# Patient Record
Sex: Female | Born: 1985 | Race: White | Hispanic: No | Marital: Married | Smoking: Never smoker
Health system: Southern US, Community
[De-identification: ages and names within clinical notes are randomized; demographics above are authoritative.]

## PROBLEM LIST (undated history)

## (undated) DIAGNOSIS — E669 Obesity, unspecified: Secondary | ICD-10-CM

## (undated) DIAGNOSIS — Z5189 Encounter for other specified aftercare: Secondary | ICD-10-CM

## (undated) DIAGNOSIS — R51 Headache: Secondary | ICD-10-CM

## (undated) DIAGNOSIS — I10 Essential (primary) hypertension: Secondary | ICD-10-CM

## (undated) DIAGNOSIS — D649 Anemia, unspecified: Secondary | ICD-10-CM

## (undated) DIAGNOSIS — E538 Deficiency of other specified B group vitamins: Secondary | ICD-10-CM

## (undated) DIAGNOSIS — Z8489 Family history of other specified conditions: Secondary | ICD-10-CM

## (undated) DIAGNOSIS — Z9889 Other specified postprocedural states: Secondary | ICD-10-CM

## (undated) DIAGNOSIS — E785 Hyperlipidemia, unspecified: Secondary | ICD-10-CM

## (undated) DIAGNOSIS — D689 Coagulation defect, unspecified: Secondary | ICD-10-CM

## (undated) DIAGNOSIS — R195 Other fecal abnormalities: Secondary | ICD-10-CM

## (undated) DIAGNOSIS — F32A Depression, unspecified: Secondary | ICD-10-CM

## (undated) DIAGNOSIS — R112 Nausea with vomiting, unspecified: Secondary | ICD-10-CM

## (undated) DIAGNOSIS — R55 Syncope and collapse: Secondary | ICD-10-CM

## (undated) DIAGNOSIS — R519 Headache, unspecified: Secondary | ICD-10-CM

## (undated) DIAGNOSIS — Z9884 Bariatric surgery status: Secondary | ICD-10-CM

## (undated) DIAGNOSIS — K219 Gastro-esophageal reflux disease without esophagitis: Secondary | ICD-10-CM

## (undated) DIAGNOSIS — G43909 Migraine, unspecified, not intractable, without status migrainosus: Secondary | ICD-10-CM

## (undated) DIAGNOSIS — Z8041 Family history of malignant neoplasm of ovary: Secondary | ICD-10-CM

## (undated) DIAGNOSIS — IMO0002 Reserved for concepts with insufficient information to code with codable children: Secondary | ICD-10-CM

## (undated) DIAGNOSIS — E559 Vitamin D deficiency, unspecified: Secondary | ICD-10-CM

## (undated) DIAGNOSIS — A6000 Herpesviral infection of urogenital system, unspecified: Secondary | ICD-10-CM

## (undated) HISTORY — PX: MOUTH SURGERY: SHX715

## (undated) HISTORY — DX: Depression, unspecified: F32.A

## (undated) HISTORY — DX: Coagulation defect, unspecified: D68.9

## (undated) HISTORY — PX: TUBAL LIGATION: SHX77

## (undated) HISTORY — DX: Other fecal abnormalities: R19.5

## (undated) HISTORY — PX: WISDOM TOOTH EXTRACTION: SHX21

## (undated) HISTORY — PX: DIAGNOSTIC LAPAROSCOPY: SUR761

## (undated) HISTORY — DX: Family history of malignant neoplasm of ovary: Z80.41

## (undated) HISTORY — DX: Bariatric surgery status: Z98.84

## (undated) HISTORY — DX: Essential (primary) hypertension: I10

## (undated) HISTORY — PX: GASTRIC BYPASS: SHX52

## (undated) HISTORY — DX: Reserved for concepts with insufficient information to code with codable children: IMO0002

## (undated) HISTORY — PX: EYE SURGERY: SHX253

## (undated) HISTORY — DX: Obesity, unspecified: E66.9

## (undated) HISTORY — PX: ENDOMETRIAL BIOPSY: SHX622

## (undated) HISTORY — PX: LASIK: SHX215

## (undated) HISTORY — DX: Anemia, unspecified: D64.9

## (undated) HISTORY — DX: Migraine, unspecified, not intractable, without status migrainosus: G43.909

## (undated) HISTORY — DX: Deficiency of other specified B group vitamins: E53.8

## (undated) HISTORY — DX: Vitamin D deficiency, unspecified: E55.9

## (undated) HISTORY — PX: SMALL INTESTINE SURGERY: SHX150

## (undated) HISTORY — PX: BUNIONECTOMY: SHX129

## (undated) HISTORY — DX: Hyperlipidemia, unspecified: E78.5

## (undated) HISTORY — DX: Encounter for other specified aftercare: Z51.89

## (undated) HISTORY — PX: APPENDECTOMY: SHX54

---
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
## 2005-01-20 ENCOUNTER — Observation Stay: Payer: Self-pay | Admitting: Unknown Physician Specialty

## 2005-04-07 ENCOUNTER — Observation Stay: Payer: Self-pay

## 2005-07-31 ENCOUNTER — Inpatient Hospital Stay: Payer: Self-pay

## 2006-04-15 ENCOUNTER — Emergency Department: Payer: Self-pay | Admitting: Emergency Medicine

## 2006-06-11 ENCOUNTER — Other Ambulatory Visit: Payer: Self-pay

## 2006-06-11 ENCOUNTER — Emergency Department: Payer: Self-pay | Admitting: Emergency Medicine

## 2006-06-11 IMAGING — CT HEAD^HEAD (ADULT)
2 series · 16 of 30 positions shown, 20 images · non-contrast
Comparison: none

REASON FOR EXAM: Altered mental status      rm 15
COMMENTS:

[Series 2: without · axial · non-contrast · 0.40mm/px · z∈[+280,+400]mm · 13 of 30 slices shown, 17 images]
[im 3/30  brain]
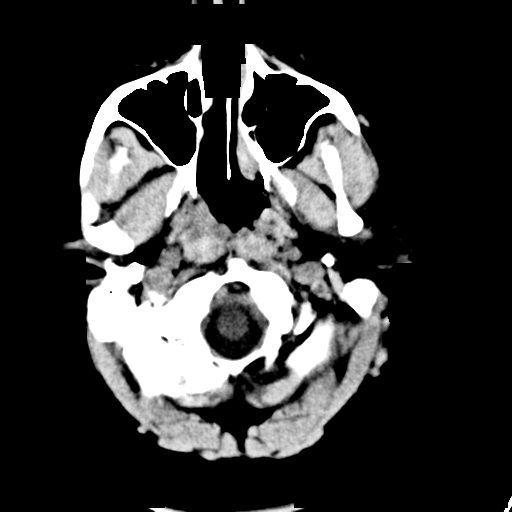
[im 3/30  bone]
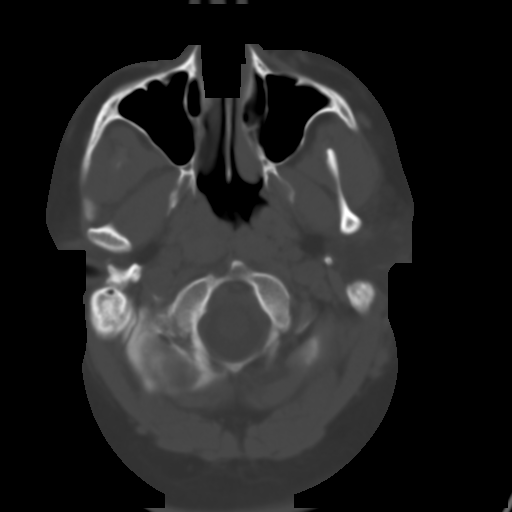
[im 5/30  brain]
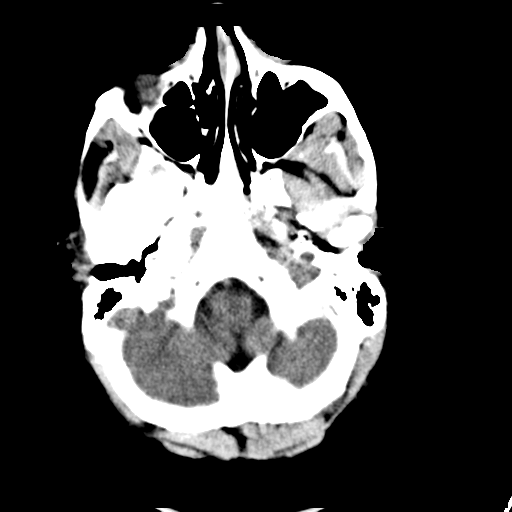
[im 7/30  brain]
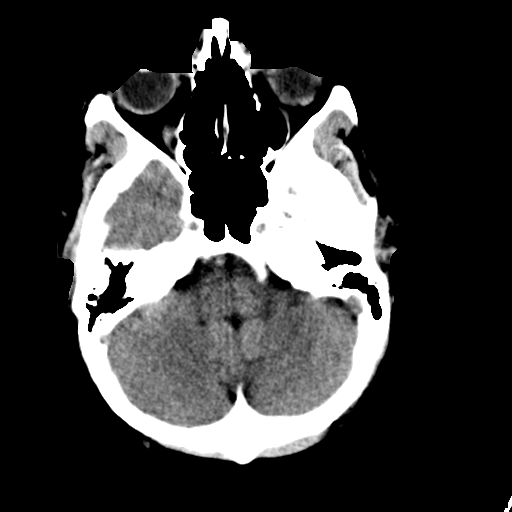
[im 9/30  brain]
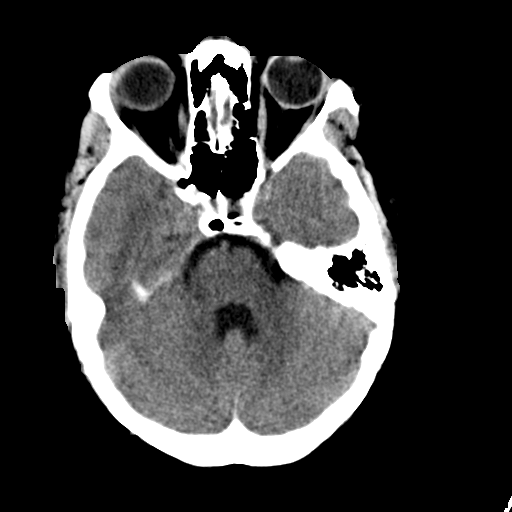
[im 11/30  brain]
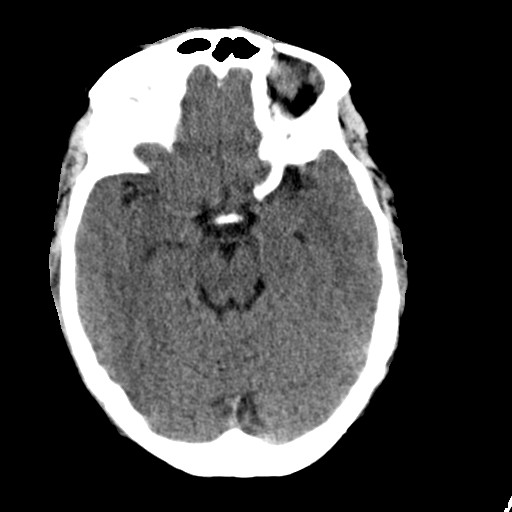
[im 11/30  bone]
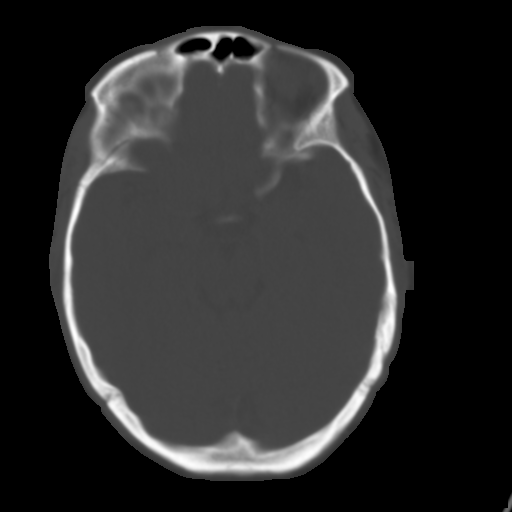
[im 13/30  brain]
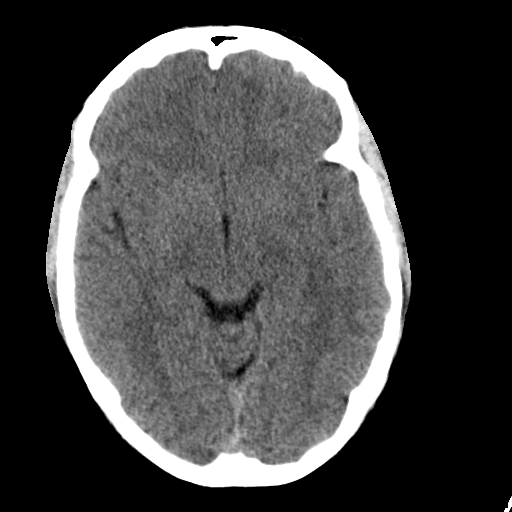
[im 15/30  brain]
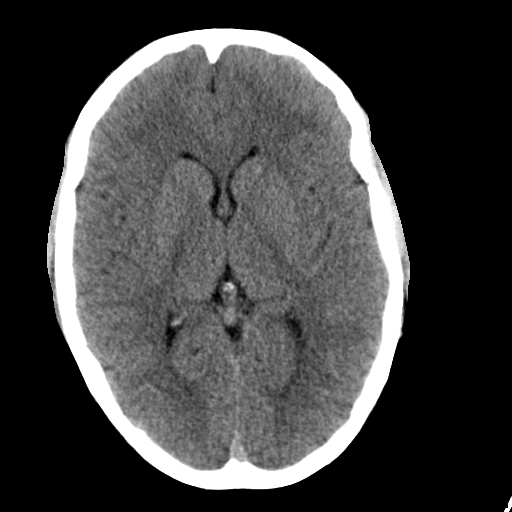
[im 17/30  brain]
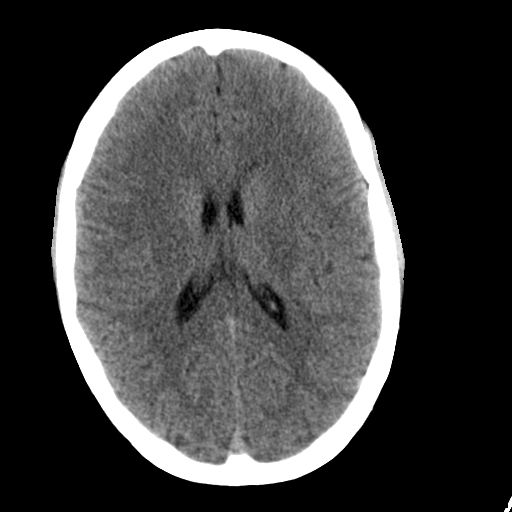
[im 19/30  brain]
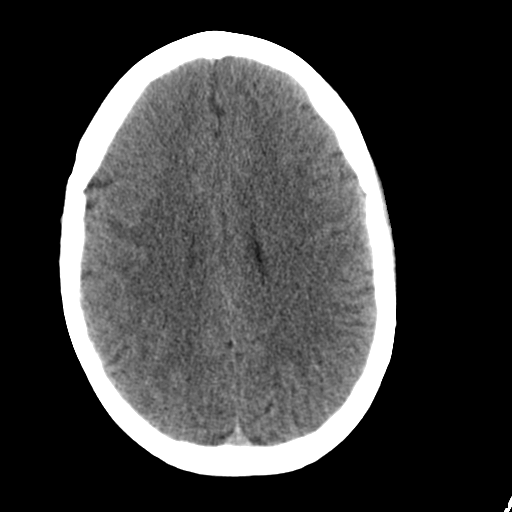
[im 19/30  bone]
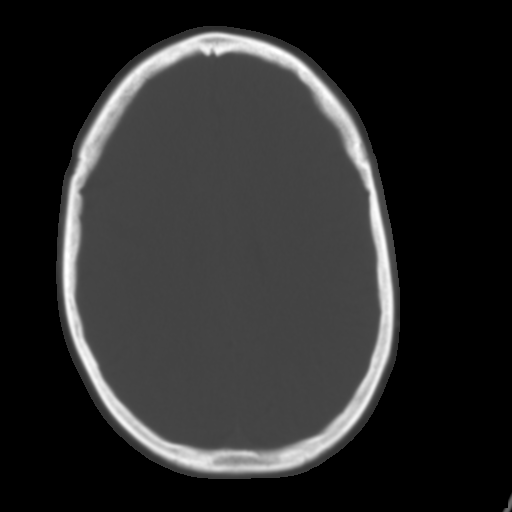
[im 21/30  brain]
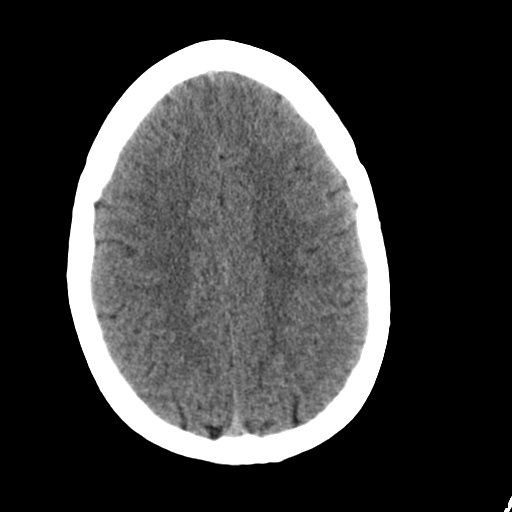
[im 23/30  brain]
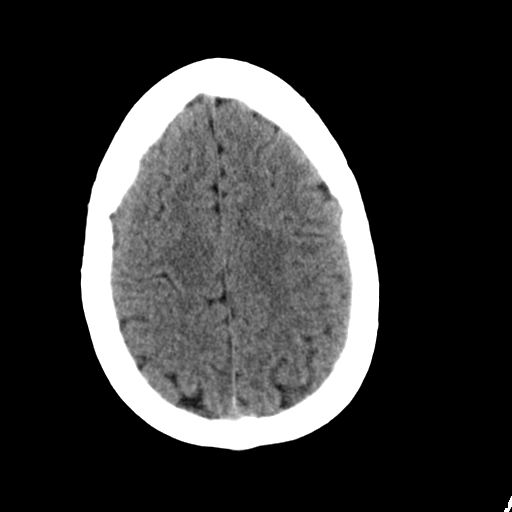
[im 25/30  brain]
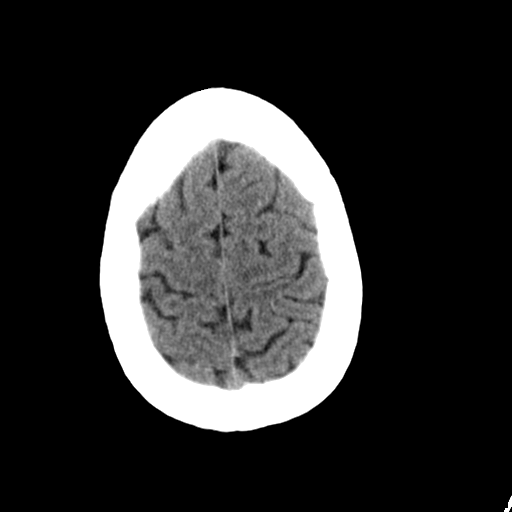
[im 27/30  brain]
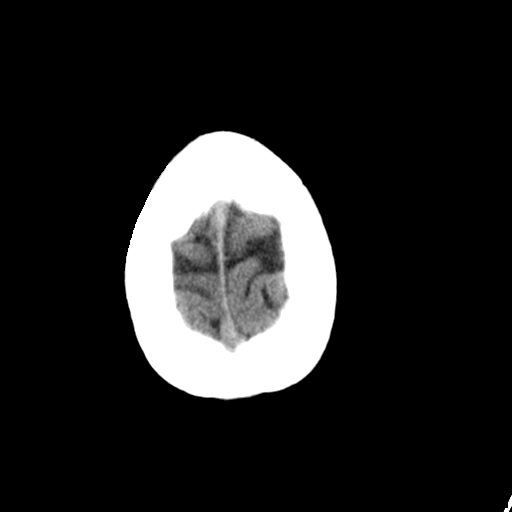
[im 27/30  bone]
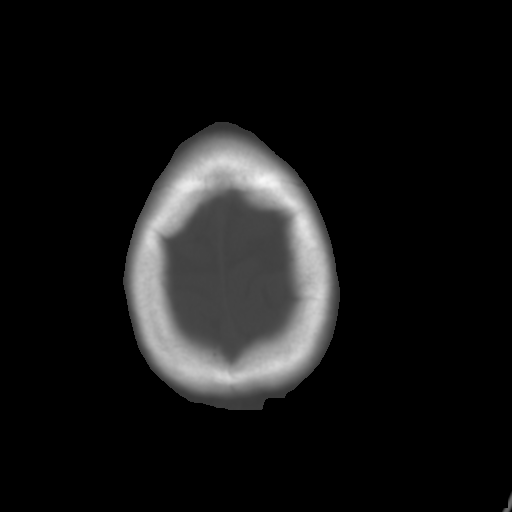

[Series 3: bone · axial · 0.40mm/px · z∈[+280,+320]mm · 3 of 30 slices shown]
[im 3/30  bone]
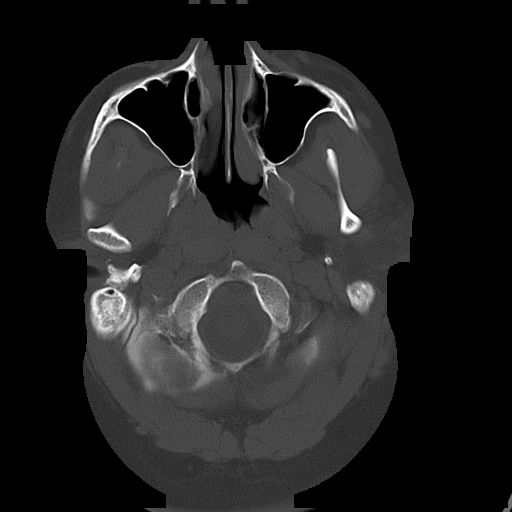
[im 7/30  bone]
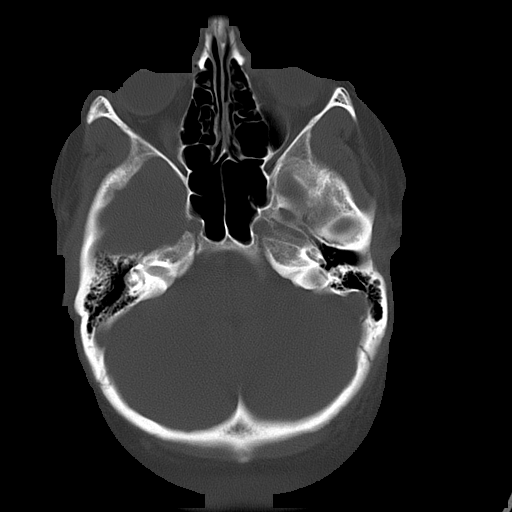
[im 11/30  bone]
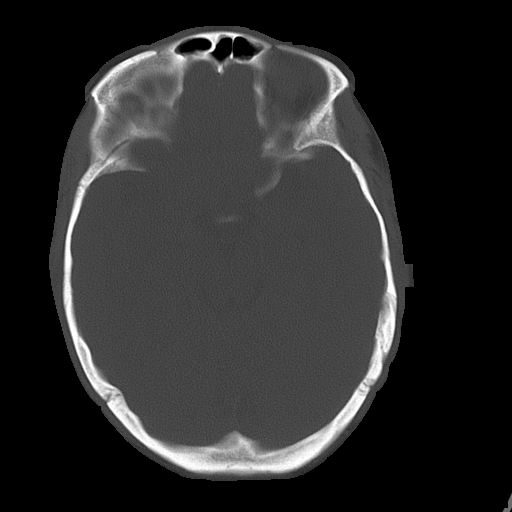

[16 of 30 positions shown; findings below may reference images not displayed]

PROCEDURE:     CT  - CT HEAD WITHOUT CONTRAST  - June 11, 2006  [DATE]

RESULT:     Unenhanced emergent head CT was performed for headaches.
Unenhanced emergent head CT reveals no intracerebral bleeds, no infarcts, no
mass effect, and no shift of the midline.  The ventricles appear within
normal limits.  No extraaxial fluid collections are seen.  On the bone
window settings no significant bony abnormalities are identified.  The
sinuses and mastoids appear clear.
IMPRESSION: No acute findings identified on the unenhanced head CT.

The report was called to the emergency room at the conclusion of the
dictation.

## 2006-06-11 IMAGING — CR DXR CHEST PA (OR AP) AND LATERAL
1 series · 2 of 2 positions shown · non-contrast
Comparison: none

REASON FOR EXAM: Altered mental status       rm 15
COMMENTS:

PROCEDURE:     DXR - DXR CHEST PA (OR AP) AND LATERAL  - June 11, 2006  [DATE]
RESULT:       The lung fields are clear.  The heart, mediastinal and osseous
structures show no significant abnormalities.

[Series 1: view not recorded · 0.17mm/px · 2 of 2 slices shown]
[im 1/2]
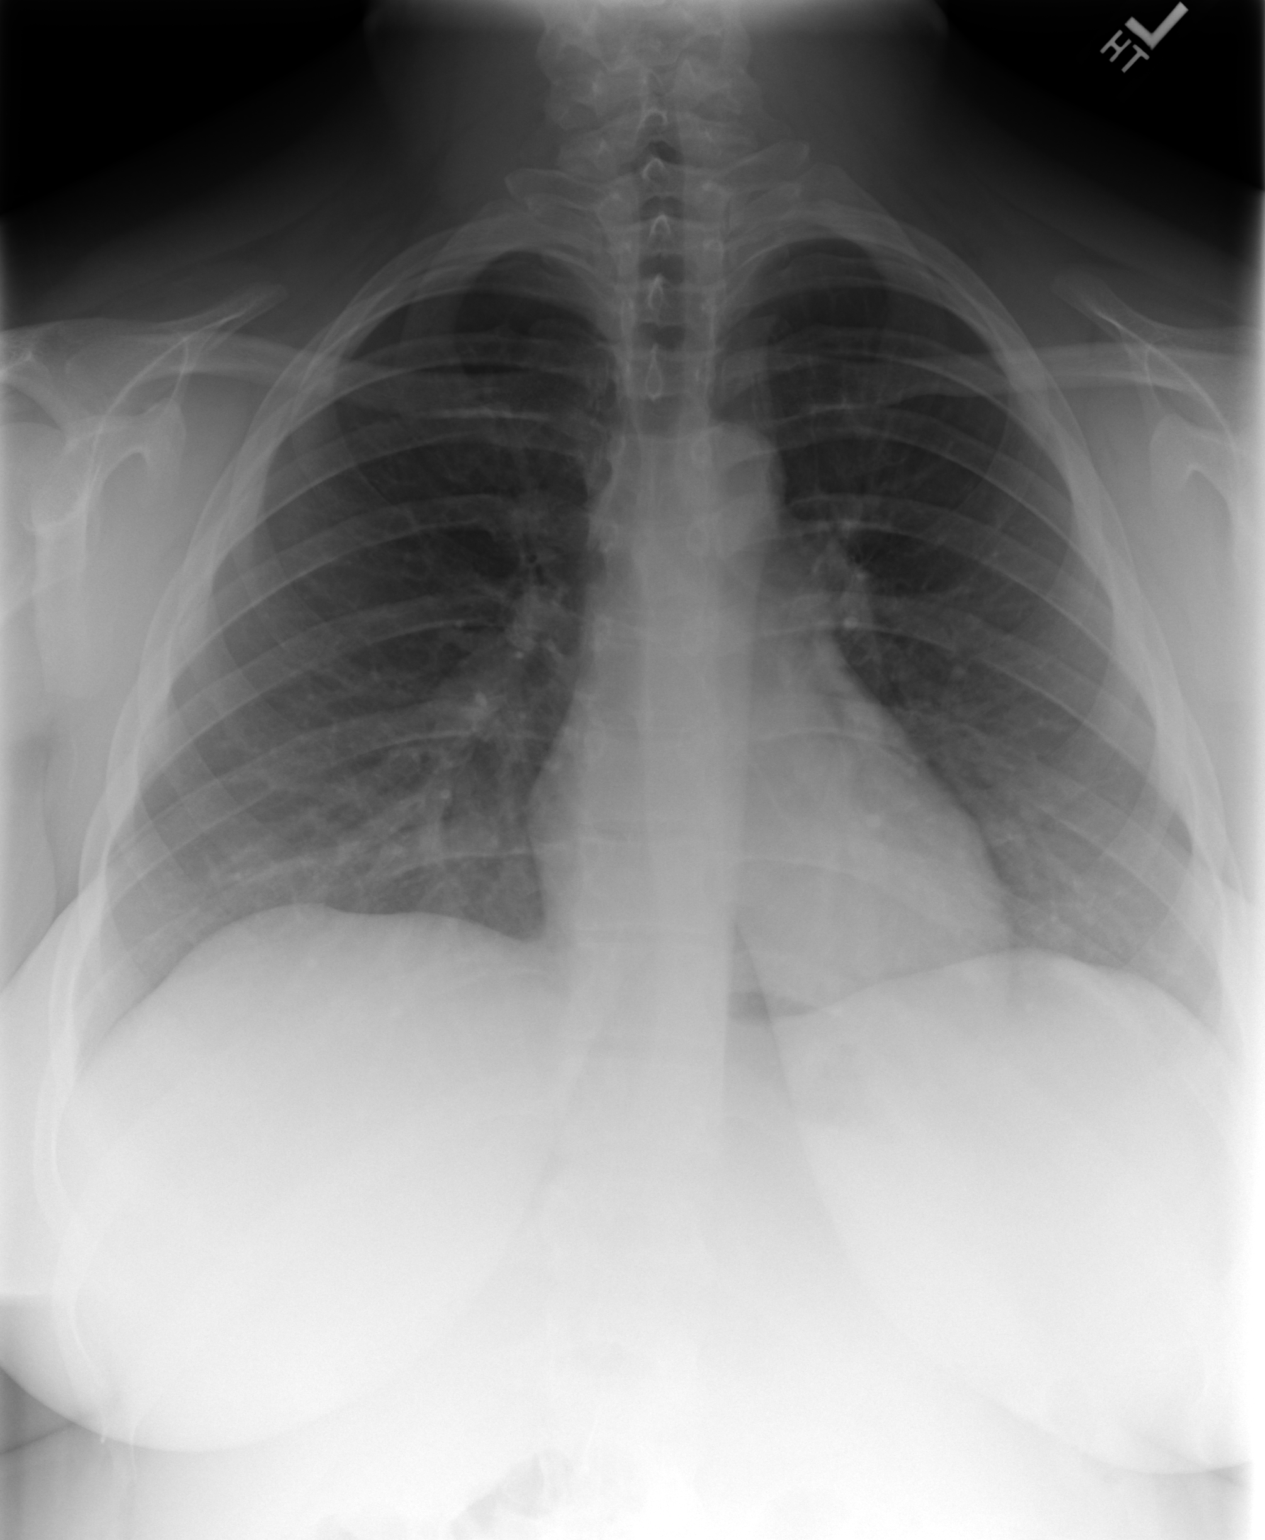
[im 2/2]
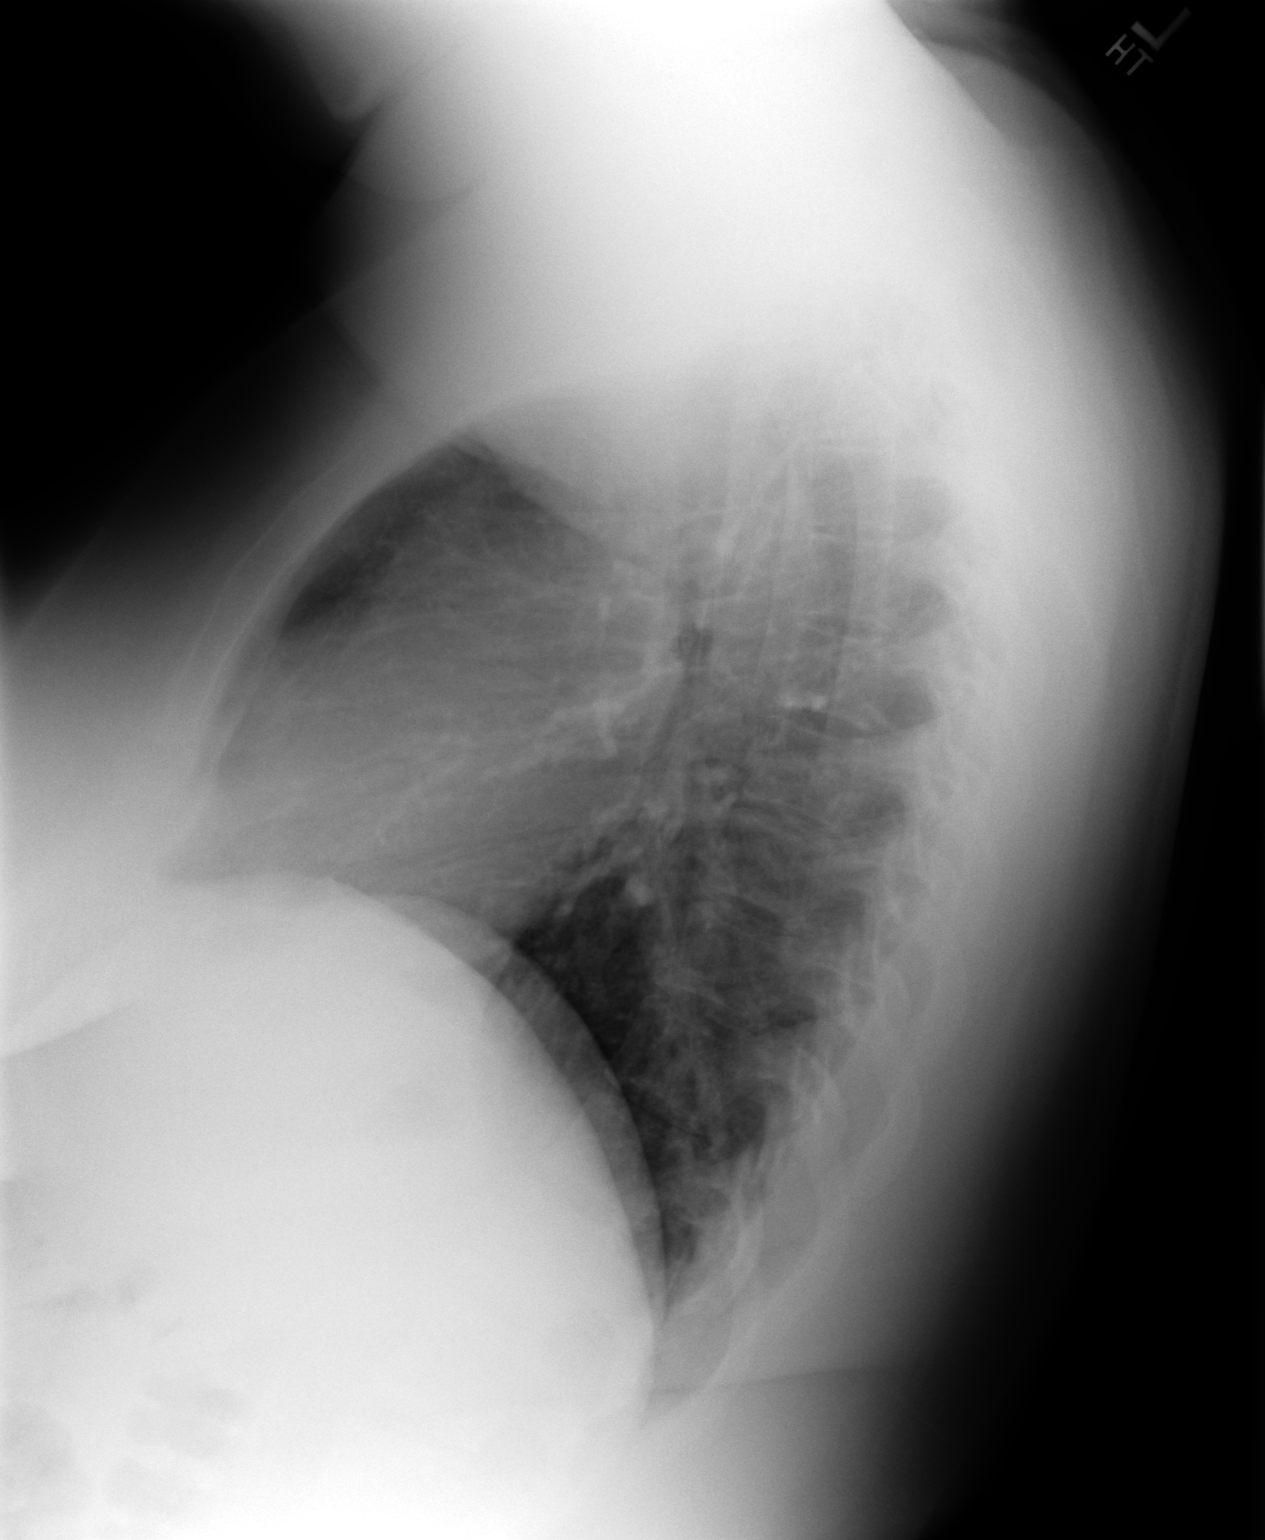

[2 of 2 positions shown; findings below may reference images not displayed]

IMPRESSION: No acute changes are identified.

## 2006-12-07 ENCOUNTER — Emergency Department: Payer: Self-pay | Admitting: Emergency Medicine

## 2007-01-09 ENCOUNTER — Emergency Department: Payer: Self-pay | Admitting: Emergency Medicine

## 2008-03-26 ENCOUNTER — Other Ambulatory Visit: Payer: Self-pay

## 2008-03-26 ENCOUNTER — Emergency Department: Payer: Self-pay | Admitting: Emergency Medicine

## 2008-03-26 IMAGING — CR DXR CHEST PA (OR AP) AND LATERAL
1 series · 2 of 2 positions shown · non-contrast
Comparison: none

REASON FOR EXAM: CHEST PAIN
COMMENTS:

[Series 1: view not recorded · 0.17mm/px · 2 of 2 slices shown]
[im 1/2]
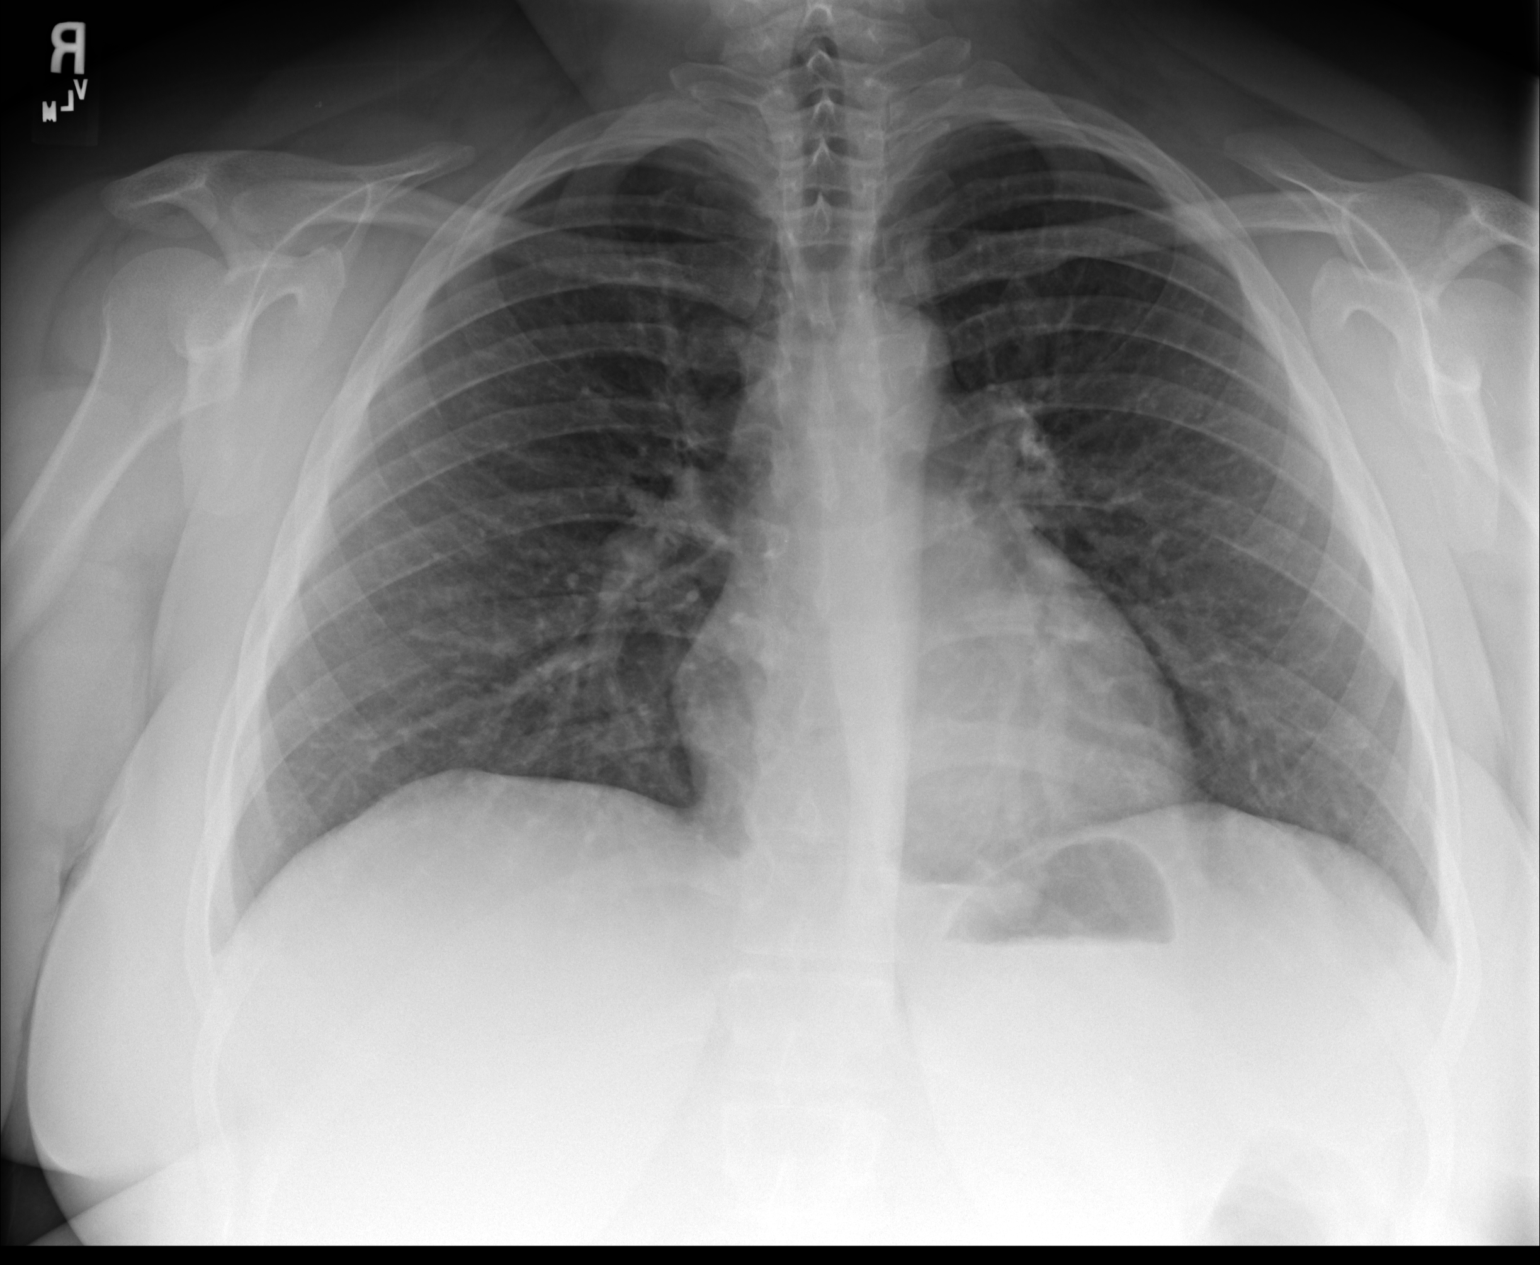
[im 2/2]
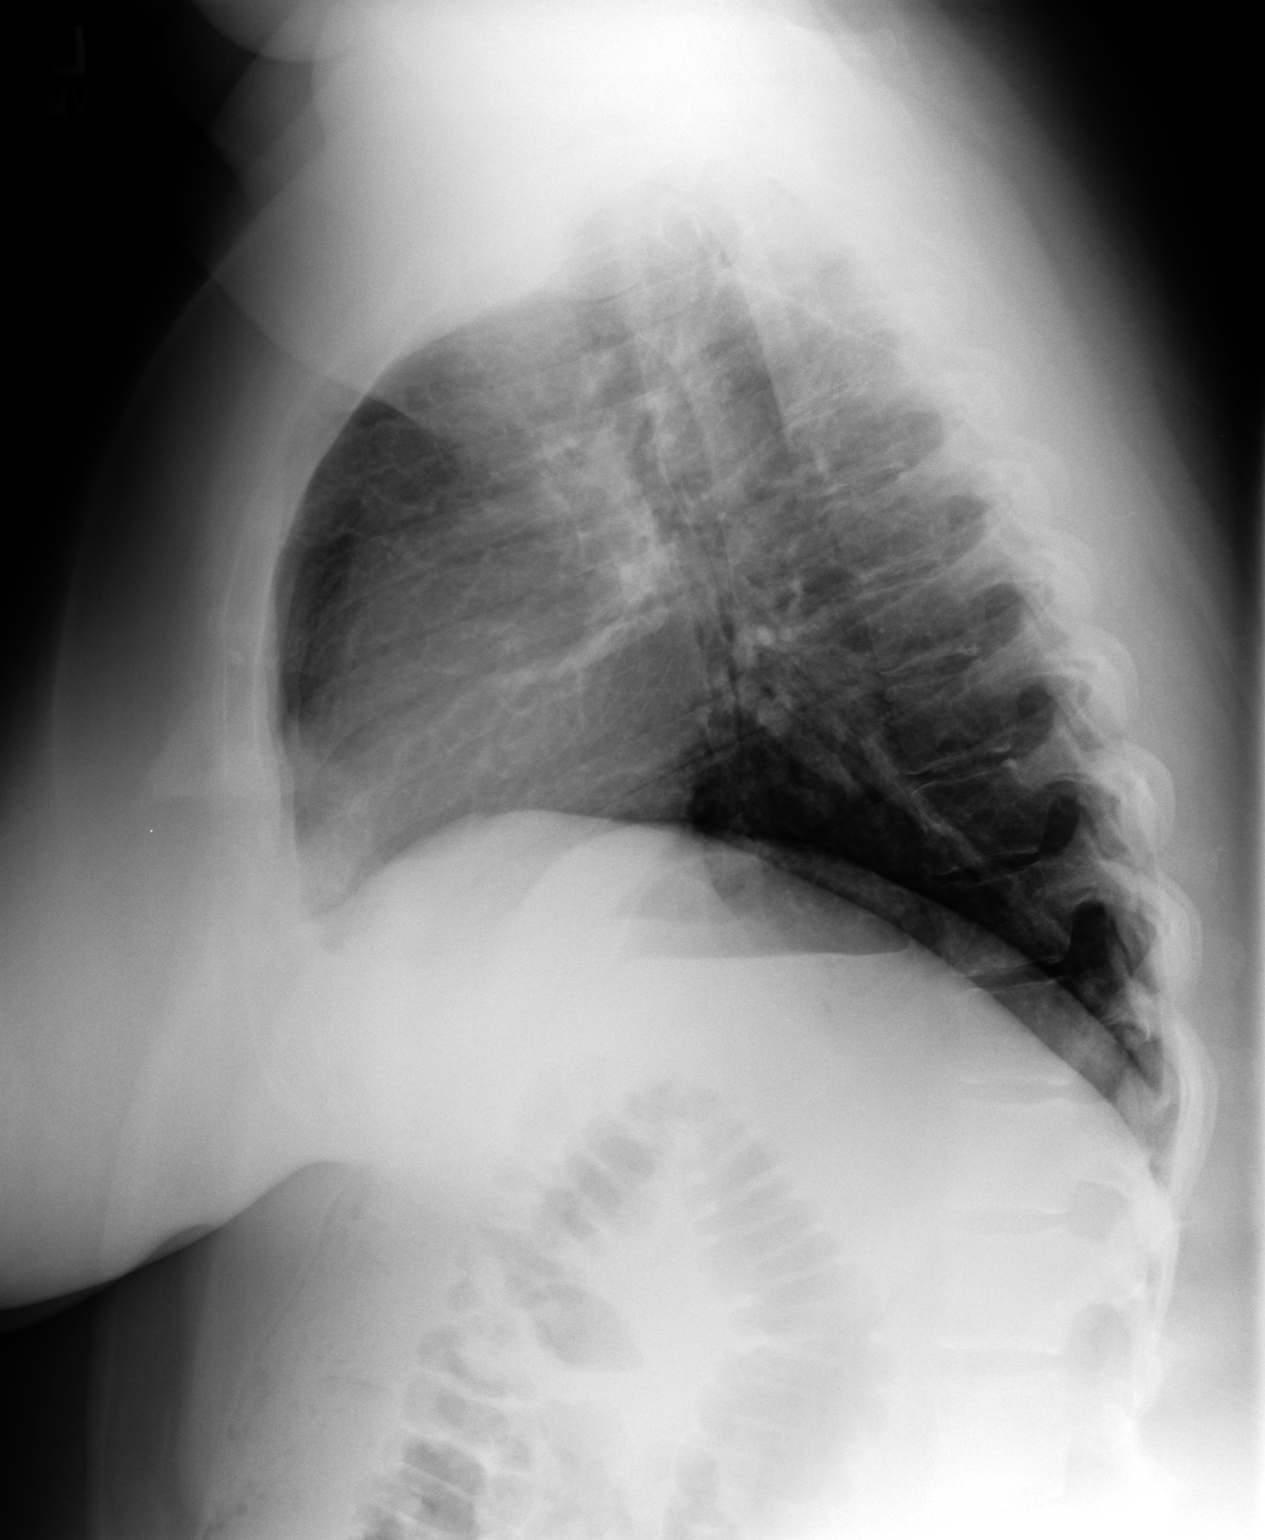

[2 of 2 positions shown; findings below may reference images not displayed]

PROCEDURE:     DXR - DXR CHEST PA (OR AP) AND LATERAL  - March 26, 2008 [DATE]

RESULT:     Comparison is made to the exam dated 06/11/2006.

The lungs are clear. The heart and pulmonary vessels are normal. The bony
and mediastinal structures are unremarkable. There is no effusion. There is
no pneumothorax or evidence of congestive failure.
IMPRESSION: No acute cardiopulmonary disease.

## 2008-04-12 ENCOUNTER — Emergency Department: Payer: Self-pay | Admitting: Emergency Medicine

## 2008-04-19 ENCOUNTER — Ambulatory Visit: Payer: Self-pay | Admitting: Internal Medicine

## 2008-08-21 ENCOUNTER — Emergency Department: Payer: Self-pay | Admitting: Emergency Medicine

## 2008-09-01 ENCOUNTER — Emergency Department: Payer: Self-pay | Admitting: Emergency Medicine

## 2009-07-26 ENCOUNTER — Ambulatory Visit: Payer: Self-pay | Admitting: Family Medicine

## 2009-07-26 IMAGING — CR LEG BONE
1 series · 4 of 4 positions shown · non-contrast
Comparison: none

REASON FOR EXAM: RT KNEE PAIN
COMMENTS:

PROCEDURE:     KDR - KDXR KNEE RT COMP WITH OBLIQUES  - July 26, 2009 [DATE]
RESULT:     Images of the right knee demonstrate no fracture, dislocation or
radiopaque foreign body.

[Series 2: view not recorded · 0.17mm/px · 4 of 4 slices shown]
[im 1/4]
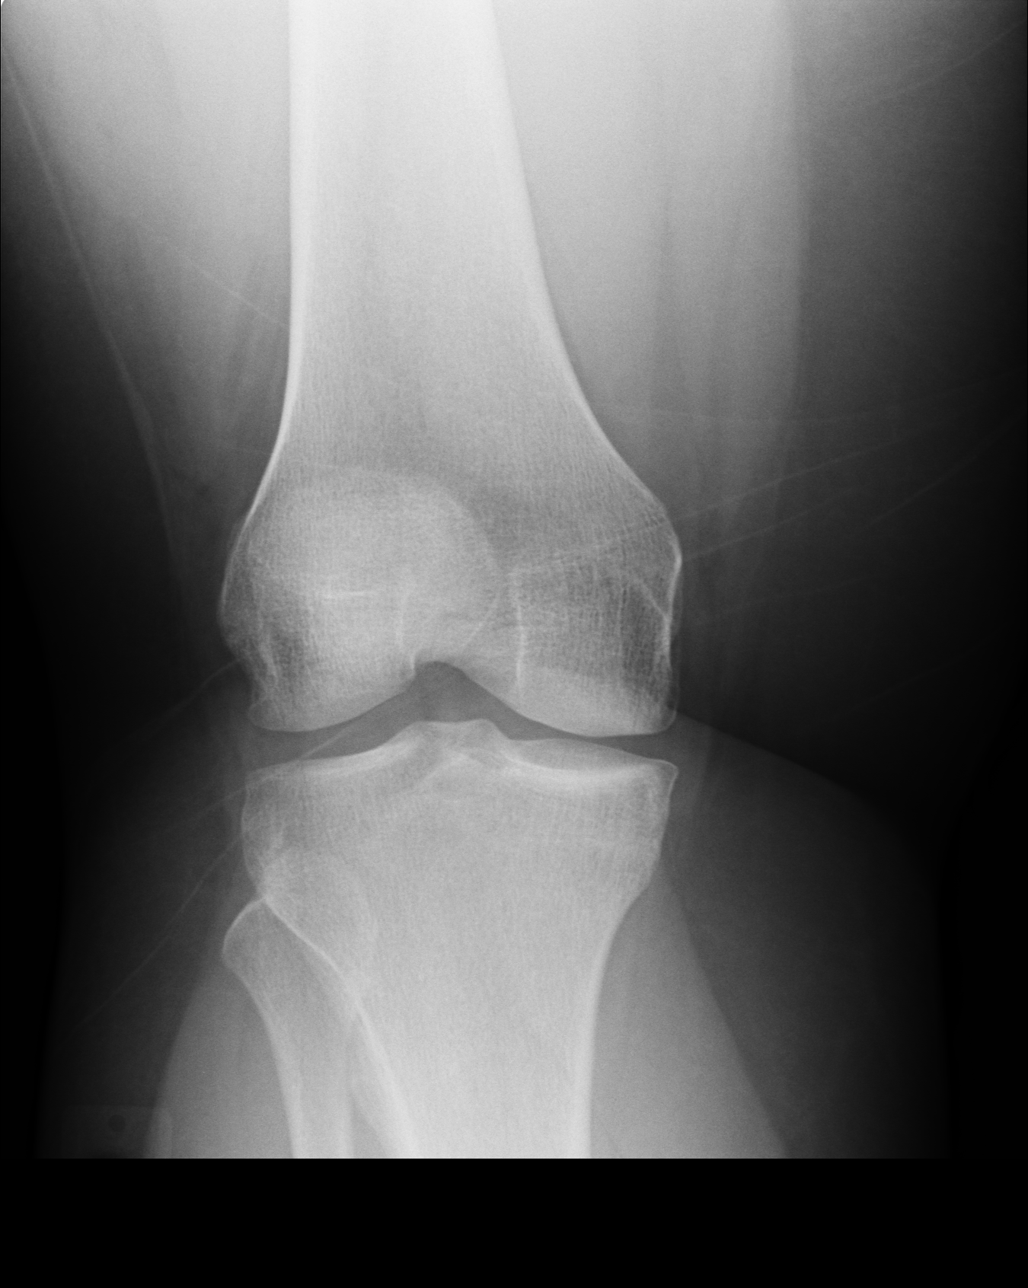
[im 2/4]
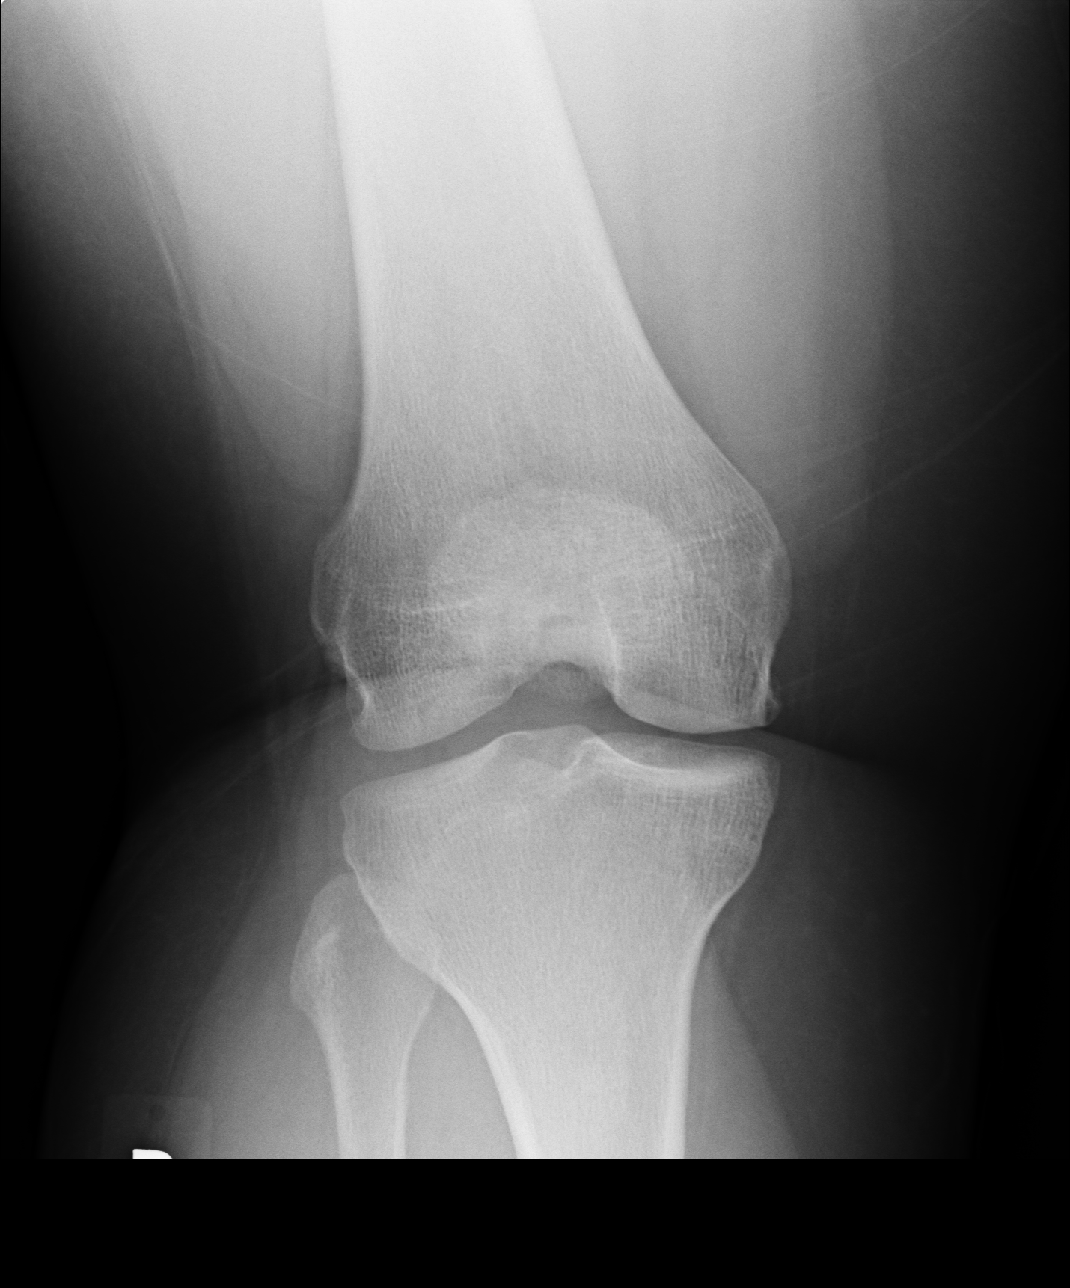
[im 3/4]
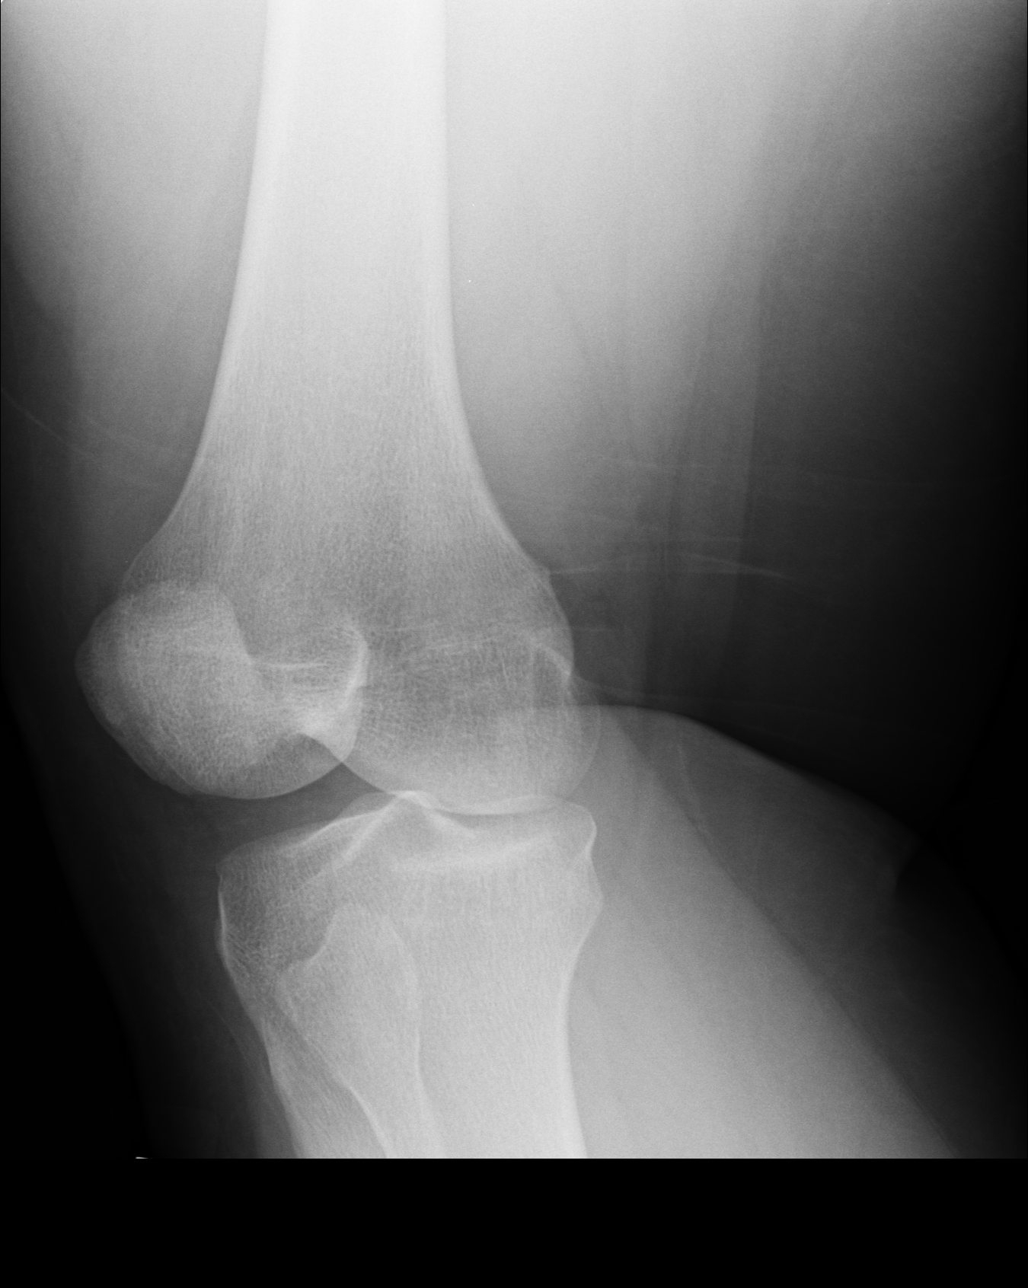
[im 4/4]
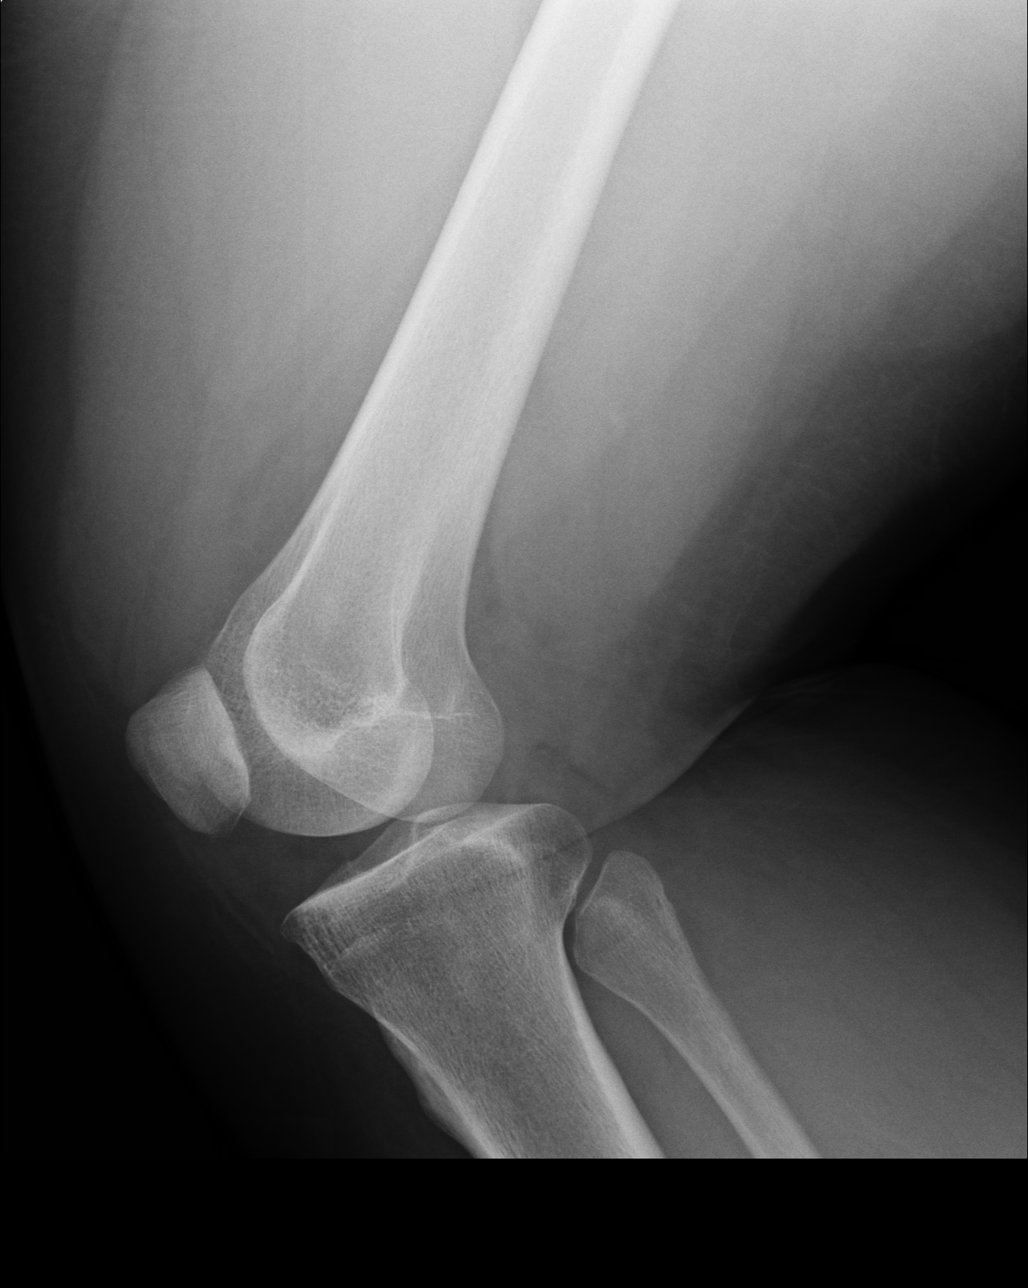

[4 of 4 positions shown; findings below may reference images not displayed]

IMPRESSION: Please see above.

## 2011-09-30 HISTORY — DX: Maternal care for unspecified type scar from previous cesarean delivery: O34.219

## 2011-12-04 DIAGNOSIS — G43009 Migraine without aura, not intractable, without status migrainosus: Secondary | ICD-10-CM | POA: Insufficient documentation

## 2011-12-04 DIAGNOSIS — G43709 Chronic migraine without aura, not intractable, without status migrainosus: Secondary | ICD-10-CM | POA: Insufficient documentation

## 2011-12-04 DIAGNOSIS — G43909 Migraine, unspecified, not intractable, without status migrainosus: Secondary | ICD-10-CM | POA: Insufficient documentation

## 2012-04-19 DIAGNOSIS — O34219 Maternal care for unspecified type scar from previous cesarean delivery: Secondary | ICD-10-CM | POA: Insufficient documentation

## 2012-06-04 DIAGNOSIS — O99810 Abnormal glucose complicating pregnancy: Secondary | ICD-10-CM | POA: Insufficient documentation

## 2012-06-04 HISTORY — DX: Abnormal glucose complicating pregnancy: O99.810

## 2012-10-22 DIAGNOSIS — D519 Vitamin B12 deficiency anemia, unspecified: Secondary | ICD-10-CM | POA: Insufficient documentation

## 2012-10-22 DIAGNOSIS — D649 Anemia, unspecified: Secondary | ICD-10-CM

## 2012-10-22 HISTORY — DX: Anemia, unspecified: D64.9

## 2013-05-25 DIAGNOSIS — Z9884 Bariatric surgery status: Secondary | ICD-10-CM | POA: Insufficient documentation

## 2014-07-16 ENCOUNTER — Ambulatory Visit: Payer: Self-pay | Admitting: Physician Assistant

## 2014-07-16 LAB — RAPID STREP-A WITH REFLX: Micro Text Report: NEGATIVE

## 2014-07-19 LAB — BETA STREP CULTURE(ARMC)

## 2014-08-05 ENCOUNTER — Ambulatory Visit: Payer: Self-pay | Admitting: Internal Medicine

## 2014-08-05 LAB — RAPID STREP-A WITH REFLX: Micro Text Report: NEGATIVE

## 2014-08-08 LAB — BETA STREP CULTURE(ARMC)

## 2015-04-30 DIAGNOSIS — R55 Syncope and collapse: Secondary | ICD-10-CM

## 2015-04-30 HISTORY — DX: Syncope and collapse: R55

## 2015-06-26 ENCOUNTER — Inpatient Hospital Stay: Admission: RE | Admit: 2015-06-26 | Payer: Self-pay | Source: Ambulatory Visit

## 2015-06-26 ENCOUNTER — Encounter: Payer: Self-pay | Admitting: *Deleted

## 2015-06-26 NOTE — Patient Instructions (Signed)
  Your procedure is scheduled on: 06-29-15 Report to [place] To find out your arrival time please call [phone] between 1PM - 3PM on 06-28-15  Remember: Instructions that are not followed completely may result in serious medical risk, up to and including death, or upon the discretion of your surgeon and anesthesiologist your surgery may need to be rescheduled.    _X___ 1. Do not eat food or drink liquids after midnight. No gum chewing or hard candies.     _X___ 2. No Alcohol for 24 hours before or after surgery.   ____ 3. Bring all medications with you on the day of surgery if instructed.    ____ 4. Notify your doctor if there is any change in your medical condition     (cold, fever, infections).     Do not wear jewelry, make-up, hairpins, clips or nail polish.  Do not wear lotions, powders, or perfumes. You may wear deodorant.  Do not shave 48 hours prior to surgery. Men may shave face and neck.  Do not bring valuables to the hospital.    [facility] is not responsible for any belongings or valuables.               Contacts, dentures or bridgework may not be worn into surgery.  Leave your suitcase in the car. After surgery it may be brought to your room.  For patients admitted to the hospital, discharge time is determined by your  treatment team.   Patients discharged the day of surgery will not be allowed to drive home.   Please read over the following fact sheets that you were given:      ____ Take these medicines the morning of surgery with A SIP OF WATER:    1. NONE  2.   3.   4.  5.  6.  ____ Fleet Enema (as directed)   ____ Use CHG Soap as directed  ____ Use inhalers on the day of surgery  ____ Stop metformin 2 days prior to surgery    ____ Take 1/2 of usual insulin dose the night before surgery and none on the morning of surgery.   ____ Stop Coumadin/Plavix/aspirin-N/A  ____ Stop Anti-inflammatories-NO NSAIDS OR ASA  PRODUCTS-TYLENOL OK   ____ Stop supplements until after surgery.    ____ Bring C-Pap to the hospital.

## 2015-06-28 NOTE — Pre-Procedure Instructions (Signed)
SENT CHART OVER TO ANESTHESIA FOR REVIEW OF NOTES FROM [facility]- DR Marylen Ponto MEDICAL CLEARANCE-CALLED NANCY AT [place] HER OF THIS- PT DOES NOT HAVE A PCP EXCEPT THAT SHE DOES SEE THE PA ALICIA COPELAND IN DR STAEBLERS OFFICE AND SHE WAS LAST SEEN BY THE PA IN [facility] OF THIS YEAR.  NANCY GOT DR STAEBLER TO CALL ME AND I INFORMED HIM OF THE CLEARANCE DUE TO PT NOT FOLLOWING UP WITH [facility] MD INSTRUCTIONS REGARDING SLOW HEART RATE ON EKG FROM BACK IN AUGUST OF THIS YEAR.  DR STAEBLER SAID THAT HE FEELS LIKE SHE NEEDS TO SEE CARDIOLOGY DUE TO POSSIBLE CONFLICT OF INTEREST WITH HER SEEING THE PA IN THEIR OFFICE.  I CALLED [facility] AND THEY CAN SEE HER TODAY AT 11:15 WITH DR CALLWOOD.  NOTIFIED NANCY AT [place] NOTIFIED THE PATIENT WHO VERBALIZED SHE WOULD BE AT [facility] AT 11:15 TODAY.  FAXED ALL PAPERWORK AND CLEARANCE NOTE TO DR CALLWOODS OFFICE. FAX CONFIRMED IT WENT THRU. ALSO FAXED PAPERWORK TO NANCY AT [place].

## 2015-06-29 ENCOUNTER — Ambulatory Visit
Admission: RE | Admit: 2015-06-29 | Discharge: 2015-06-29 | Disposition: A | Discharge status: Home / Self Care | Payer: 59 | Source: Ambulatory Visit | Attending: Obstetrics and Gynecology | Admitting: Obstetrics and Gynecology

## 2015-06-29 ENCOUNTER — Encounter: Payer: Self-pay | Admitting: *Deleted

## 2015-06-29 ENCOUNTER — Ambulatory Visit: Payer: 59 | Admitting: Anesthesiology

## 2015-06-29 ENCOUNTER — Encounter
Admission: RE | Disposition: A | Discharge status: Home / Self Care | Payer: Self-pay | Source: Ambulatory Visit | Attending: Obstetrics and Gynecology

## 2015-06-29 DIAGNOSIS — Z6835 Body mass index (BMI) 35.0-35.9, adult: Secondary | ICD-10-CM | POA: Diagnosis not present

## 2015-06-29 DIAGNOSIS — Z9884 Bariatric surgery status: Secondary | ICD-10-CM | POA: Diagnosis not present

## 2015-06-29 DIAGNOSIS — N920 Excessive and frequent menstruation with regular cycle: Secondary | ICD-10-CM | POA: Insufficient documentation

## 2015-06-29 DIAGNOSIS — Z79899 Other long term (current) drug therapy: Secondary | ICD-10-CM | POA: Diagnosis not present

## 2015-06-29 DIAGNOSIS — E282 Polycystic ovarian syndrome: Secondary | ICD-10-CM | POA: Insufficient documentation

## 2015-06-29 DIAGNOSIS — Z9889 Other specified postprocedural states: Secondary | ICD-10-CM

## 2015-06-29 DIAGNOSIS — Z9049 Acquired absence of other specified parts of digestive tract: Secondary | ICD-10-CM | POA: Insufficient documentation

## 2015-06-29 DIAGNOSIS — Z833 Family history of diabetes mellitus: Secondary | ICD-10-CM | POA: Diagnosis not present

## 2015-06-29 DIAGNOSIS — G43909 Migraine, unspecified, not intractable, without status migrainosus: Secondary | ICD-10-CM | POA: Diagnosis not present

## 2015-06-29 DIAGNOSIS — Z8489 Family history of other specified conditions: Secondary | ICD-10-CM | POA: Diagnosis not present

## 2015-06-29 DIAGNOSIS — A6000 Herpesviral infection of urogenital system, unspecified: Secondary | ICD-10-CM | POA: Insufficient documentation

## 2015-06-29 HISTORY — DX: Headache, unspecified: R51.9

## 2015-06-29 HISTORY — DX: Other specified postprocedural states: Z98.890

## 2015-06-29 HISTORY — DX: Family history of other specified conditions: Z84.89

## 2015-06-29 HISTORY — PX: NOVASURE ABLATION: SHX5394

## 2015-06-29 HISTORY — DX: Herpesviral infection of urogenital system, unspecified: A60.00

## 2015-06-29 HISTORY — DX: Syncope and collapse: R55

## 2015-06-29 HISTORY — DX: Headache: R51

## 2015-06-29 HISTORY — DX: Nausea with vomiting, unspecified: R11.2

## 2015-06-29 LAB — POCT PREGNANCY, URINE: PREG TEST UR: NEGATIVE

## 2015-06-29 SURGERY — NOVASURE ABLATION
Anesthesia: General | Wound class: Clean Contaminated

## 2015-06-29 MED ORDER — SUMATRIPTAN 20 MG/ACT NA SOLN: 20.0000 mg | NASAL | Status: DC | PRN

## 2015-06-29 MED ORDER — LACTATED RINGERS IV SOLN: INTRAVENOUS | Status: DC

## 2015-06-29 MED ORDER — FENTANYL CITRATE (PF) 100 MCG/2ML IJ SOLN
INTRAMUSCULAR | Status: DC | PRN
  Administered 2015-06-29: 100 ug via INTRAVENOUS

## 2015-06-29 MED ORDER — FENTANYL CITRATE (PF) 100 MCG/2ML IJ SOLN
INTRAMUSCULAR | Status: AC
  Filled 2015-06-29: qty 2

## 2015-06-29 MED ORDER — FAMOTIDINE 20 MG PO TABS
ORAL_TABLET | ORAL | Status: AC
  Filled 2015-06-29: qty 1

## 2015-06-29 MED ORDER — IBUPROFEN 600 MG PO TABS: 600.0000 mg | ORAL_TABLET | Freq: Four times a day (QID) | ORAL | Status: DC | PRN

## 2015-06-29 MED ORDER — PROMETHAZINE HCL 25 MG/ML IJ SOLN: 6.2500 mg | INTRAMUSCULAR | Status: DC | PRN

## 2015-06-29 MED ORDER — LACTATED RINGERS IV SOLN
INTRAVENOUS | Status: DC | PRN
  Administered 2015-06-29: 12:00:00 via INTRAVENOUS

## 2015-06-29 MED ORDER — OXYCODONE-ACETAMINOPHEN 5-325 MG PO TABS
1.0000 | ORAL_TABLET | Freq: Four times a day (QID) | ORAL | Status: DC | PRN
  Administered 2015-06-29: 1 via ORAL

## 2015-06-29 MED ORDER — HYDROCODONE-ACETAMINOPHEN 5-325 MG PO TABS: 1.0000 | ORAL_TABLET | Freq: Four times a day (QID) | ORAL | Status: DC | PRN

## 2015-06-29 MED ORDER — ONDANSETRON HCL 4 MG/2ML IJ SOLN
INTRAMUSCULAR | Status: DC | PRN
  Administered 2015-06-29: 4 mg via INTRAVENOUS

## 2015-06-29 MED ORDER — GLYCOPYRROLATE 0.2 MG/ML IJ SOLN
INTRAMUSCULAR | Status: DC | PRN
  Administered 2015-06-29: 0.2 mg via INTRAVENOUS

## 2015-06-29 MED ORDER — ACETAMINOPHEN 325 MG PO TABS
650.0000 mg | ORAL_TABLET | Freq: Once | ORAL | Status: AC
  Administered 2015-06-29: 650 mg via ORAL

## 2015-06-29 MED ORDER — PROPOFOL 10 MG/ML IV BOLUS
INTRAVENOUS | Status: DC | PRN
  Administered 2015-06-29: 150 mg via INTRAVENOUS

## 2015-06-29 MED ORDER — OXYCODONE-ACETAMINOPHEN 5-325 MG PO TABS
ORAL_TABLET | ORAL | Status: AC
  Filled 2015-06-29: qty 1

## 2015-06-29 MED ORDER — OXYCODONE-ACETAMINOPHEN 5-325 MG PO TABS: 1.0000 | ORAL_TABLET | Freq: Four times a day (QID) | ORAL | Status: DC | PRN

## 2015-06-29 MED ORDER — ACETAMINOPHEN 325 MG PO TABS
ORAL_TABLET | ORAL | Status: AC
  Filled 2015-06-29: qty 2

## 2015-06-29 MED ORDER — FAMOTIDINE 20 MG PO TABS
20.0000 mg | ORAL_TABLET | Freq: Once | ORAL | Status: AC
  Administered 2015-06-29: 20 mg via ORAL

## 2015-06-29 MED ORDER — MIDAZOLAM HCL 2 MG/2ML IJ SOLN
INTRAMUSCULAR | Status: DC | PRN
  Administered 2015-06-29: 2 mg via INTRAVENOUS

## 2015-06-29 MED ORDER — FENTANYL CITRATE (PF) 100 MCG/2ML IJ SOLN
25.0000 ug | INTRAMUSCULAR | Status: DC | PRN
  Administered 2015-06-29 (×2): 25 ug via INTRAVENOUS

## 2015-06-29 SURGICAL SUPPLY — 15 items
CATH ROBINSON RED A/P 16FR (CATHETERS) ×3 IMPLANT
GLOVE BIO SURGEON STRL SZ7 (GLOVE) ×3 IMPLANT
GOWN STRL REUS W/ TWL LRG LVL3 (GOWN DISPOSABLE) ×2 IMPLANT
GOWN STRL REUS W/TWL LRG LVL3 (GOWN DISPOSABLE) ×4
IV LACTATED RINGERS 1000ML (IV SOLUTION) ×3 IMPLANT
KIT RM TURNOVER CYSTO AR (KITS) ×3 IMPLANT
NOVASURE ENDOMETRIAL ABLATION (MISCELLANEOUS) ×3 IMPLANT
NS IRRIG 500ML POUR BTL (IV SOLUTION) ×3 IMPLANT
PACK DNC HYST (MISCELLANEOUS) ×3 IMPLANT
PAD OB MATERNITY 4.3X12.25 (PERSONAL CARE ITEMS) ×3 IMPLANT
PAD PREP 24X41 OB/GYN DISP (PERSONAL CARE ITEMS) ×3 IMPLANT
SEAL ROD LENS SCOPE MYOSURE (ABLATOR) ×3 IMPLANT
TOWEL OR 17X26 4PK STRL BLUE (TOWEL DISPOSABLE) ×3 IMPLANT
TUBING CONNECTING 10 (TUBING) ×2 IMPLANT
TUBING CONNECTING 10' (TUBING) ×1

## 2015-06-29 NOTE — Transfer of Care (Signed)
Immediate Anesthesia Transfer of Care Note  Patient: Jean Pitts  Procedure(s) Performed: Procedure(s): DILATION AND CURETTAGE, HYSTEROSCOPY, NOVASURE ABLATION (N/A)  Patient Location: PACU  Anesthesia Type:General  Level of Consciousness: awake  Airway & Oxygen Therapy: Patient Spontanous Breathing and Patient connected to face mask oxygen  Post-op Assessment: Report given to RN, Post -op Vital signs reviewed and stable, Patient moving all extremities and Patient moving all extremities X 4  Post vital signs: Reviewed and stable  Last Vitals:  Filed Vitals:   06/29/15 1253  BP: 120/70  Pulse: 59  Temp: 36.6 C  Resp: 18    Complications: No apparent anesthesia complications

## 2015-06-29 NOTE — Discharge Instructions (Signed)

## 2015-06-29 NOTE — H&P (Signed)
Date of Initial paper H&P: 06/26/2015  History reviewed, patient examined, no change in status, stable for surgery.

## 2015-06-29 NOTE — Anesthesia Procedure Notes (Signed)
Procedure Name: LMA Insertion Performed by: Jennette Bill Pre-anesthesia Checklist: Patient identified Patient Re-evaluated:Patient Re-evaluated prior to inductionOxygen Delivery Method: Circle system utilized Preoxygenation: Pre-oxygenation with 100% oxygen Intubation Type: IV induction Ventilation: Mask ventilation without difficulty LMA: LMA inserted Tube type: Oral Number of attempts: 1 Placement Confirmation: positive ETCO2 and breath sounds checked- equal and bilateral Tube secured with: Tape Dental Injury: Teeth and Oropharynx as per pre-operative assessment

## 2015-06-29 NOTE — Op Note (Signed)
Patient Name: Jean Pitts Date of Procedure: 06/29/2015  Preoperative Diagnosis: 1) 29 y.o. with menorrhagia  Postoperative Diagnosis: 1) 29 y.o. with menorrhagia  Operation Performed: Hysteroscopy, dilation and curettage, Novasure endometrial ablation  Indication: Menorrhagia, unresponsive to medical management   Anesthesia: General  Primary Surgeon: Malachy Mood, MD  Assistant: none  Preoperative Antibiotics: none  Estimated Blood Loss: minimal  IV Fluids: 861mL  Urine Output:: ~65mL straight cath  Drains or Tubes: none  Implants: none  Specimens Removed: none  Complications: none  Intraoperative Findings:  Normal uterine cavity, sounding length 9.5cm, cervix 5cm, width 3.5cm, power 80 burn time 43s.  Good coverage of endometrial cavity post ablation  Patient Condition: stable  Procedure in Detail:  Patient was taken to the operating room were she was administered general endotracheal anesthesia.  She was positioned in the dorsal lithotomy position utilizing Allen stirups, prepped and draped in the usual sterile fashion.  Uterus was noted to be nonenlarged in size, anteverted.   Prior to proceeding with the case a time out was performed.  Attention was turned to the patient's pelvis.  A red rubber catheter was used to empty the patient's bladder.  An operative speculum was placed to allow visualization of the cervix.  The anterior lip of the cervix was grasped with a single tooth tenaculum and the cervix, sounded to 9.5cm using uterine sound, and was sequentially dilated using pratt dilators.  The hysteroscope was then advanced into the uterine cavity noting the above findings.  The internal cervical os was marked on the hysteroscopy at 4.5cm  Sharp curettage was performed.  The Novasure device was introduced in the uterine cavity, deployed with a width of 3.5cm.  The device passed cavity assessment and was activated.  Once the cycle had completed second look  hysteroscopy revealed good coverage of the cavity  The single tooth tenaculum was removed from the cervix.  The tenaculum sites and cervix were noted to be  Hemostatic before removing the operative speculum.  Sponge needle and instrument counts were corrects times two.  The patient tolerated the procedure well and was taken to the recovery room in stable condition.

## 2015-06-29 NOTE — Anesthesia Preprocedure Evaluation (Signed)
Anesthesia Evaluation  Patient identified by MRN, date of birth, ID band Patient awake    Reviewed: Allergy & Precautions, H&P , NPO status , Patient's Chart, lab work & pertinent test results, reviewed documented beta blocker date and time   History of Anesthesia Complications (+) PONV and history of anesthetic complications  Airway Mallampati: III  TM Distance: >3 FB Neck ROM: full    Dental no notable dental hx. (+) Teeth Intact   Pulmonary neg pulmonary ROS,    Pulmonary exam normal breath sounds clear to auscultation       Cardiovascular Exercise Tolerance: Good negative cardio ROS Normal cardiovascular exam Rhythm:regular Rate:Normal     Neuro/Psych  Headaches, neg Seizures negative psych ROS   GI/Hepatic negative GI ROS, Neg liver ROS,   Endo/Other  negative endocrine ROS  Renal/GU negative Renal ROS  negative genitourinary   Musculoskeletal   Abdominal   Peds  Hematology negative hematology ROS (+)   Anesthesia Other Findings Past Medical History:   Headache                                                       Comment:MIGRAINE   Genital herpes                                               PONV (postoperative nausea and vomiting)                     Family history of adverse reaction to anesthes*                Comment:MATERNAL AUNT-PT UNAWARE OF WHAT HAPPENED WITH               ANESTHESIA BUT STATES HER AUNT HAS HAD PROBLEMS              IN PAST    Syncope                                         04-2015       Reproductive/Obstetrics negative OB ROS                             Anesthesia Physical Anesthesia Plan  ASA: II  Anesthesia Plan: General   Post-op Pain Management:    Induction:   Airway Management Planned:   Additional Equipment:   Intra-op Plan:   Post-operative Plan:   Informed Consent: I have reviewed the patients History and Physical, chart, labs  and discussed the procedure including the risks, benefits and alternatives for the proposed anesthesia with the patient or authorized representative who has indicated his/her understanding and acceptance.   Dental Advisory Given  Plan Discussed with: Anesthesiologist, CRNA and Surgeon  Anesthesia Plan Comments:         Anesthesia Quick Evaluation

## 2015-07-05 NOTE — Anesthesia Postprocedure Evaluation (Signed)
  Anesthesia Post-op Note  Patient: Jean Pitts  Procedure(s) Performed: Procedure(s): DILATION AND CURETTAGE, HYSTEROSCOPY, NOVASURE ABLATION (N/A)  Anesthesia type:General  Patient location: PACU  Post pain: Pain level controlled  Post assessment: Post-op Vital signs reviewed, Patient's Cardiovascular Status Stable, Respiratory Function Stable, Patent Airway and No signs of Nausea or vomiting  Post vital signs: Reviewed and stable  Last Vitals:  Filed Vitals:   06/29/15 1424  BP: 100/65  Pulse: 55  Temp:   Resp: 16    Level of consciousness: awake, alert  and patient cooperative  Complications: No apparent anesthesia complications

## 2016-05-28 LAB — BASIC METABOLIC PANEL: GLUCOSE: 94 mg/dL

## 2016-05-28 LAB — LIPID PANEL
Cholesterol: 187 mg/dL (ref 0–200)
HDL: 55 mg/dL (ref 35–70)
LDL Cholesterol: 111 mg/dL
Triglycerides: 106 mg/dL (ref 40–160)

## 2016-05-28 LAB — HEMOGLOBIN A1C: Hemoglobin A1C: 5

## 2016-06-04 ENCOUNTER — Ambulatory Visit (INDEPENDENT_AMBULATORY_CARE_PROVIDER_SITE_OTHER): Payer: 59 | Admitting: Family Medicine

## 2016-06-04 ENCOUNTER — Encounter: Payer: Self-pay | Admitting: Family Medicine

## 2016-06-04 DIAGNOSIS — B372 Candidiasis of skin and nail: Secondary | ICD-10-CM

## 2016-06-04 DIAGNOSIS — Z8679 Personal history of other diseases of the circulatory system: Secondary | ICD-10-CM | POA: Diagnosis not present

## 2016-06-04 DIAGNOSIS — E282 Polycystic ovarian syndrome: Secondary | ICD-10-CM | POA: Insufficient documentation

## 2016-06-04 DIAGNOSIS — D519 Vitamin B12 deficiency anemia, unspecified: Secondary | ICD-10-CM

## 2016-06-04 DIAGNOSIS — E559 Vitamin D deficiency, unspecified: Secondary | ICD-10-CM | POA: Diagnosis not present

## 2016-06-04 DIAGNOSIS — Z9884 Bariatric surgery status: Secondary | ICD-10-CM

## 2016-06-04 DIAGNOSIS — Z8639 Personal history of other endocrine, nutritional and metabolic disease: Secondary | ICD-10-CM | POA: Insufficient documentation

## 2016-06-04 DIAGNOSIS — G43009 Migraine without aura, not intractable, without status migrainosus: Secondary | ICD-10-CM | POA: Diagnosis not present

## 2016-06-04 MED ORDER — NALTREXONE-BUPROPION HCL ER 8-90 MG PO TB12: 2.0000 | ORAL_TABLET | Freq: Two times a day (BID) | ORAL | 1 refills | Status: DC

## 2016-06-04 MED ORDER — KETOCONAZOLE 2 % EX CREA: 1.0000 "application " | TOPICAL_CREAM | Freq: Every day | CUTANEOUS | 0 refills | Status: DC

## 2016-06-04 NOTE — Progress Notes (Addendum)
Name: Jean Pitts   MRN: [id number]    DOB: [birth year]   Date:06/04/2016       Progress Note  Subjective  Chief Complaint  Chief Complaint  Patient presents with  . Establish Care    HPI  Morbid obesity: she had gastric bypass surgery in 2014, after the birth of her second son. She went down from 340 lbs  down to 185 lbs ( with the help of Adipex ) but has gradually gained her weight back. She states she tries to eat healthy but seems to always feel hungry. The weight gain makes her feel very frustrated. 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   History of HTN and Hyperlipidemia: resolves since bariatric surgery  Migraine headaches: she states migraine is well controlled with Topamax, however she states she feels very tired. Taking medication around 8 pm, but works full time and is going to school , therefore she does not have enough hours left to sleep and feels groggy with medication. She stopped taking medication a couple of days ago. Explained that Topamax needs to be titrated up and down slowly. Discussed other medications, but explained that since she is trying to start another weight loss medication she may want to stay on Topamax until we find out if new medications causes any side effects.    Patient Active Problem List   Diagnosis Date Noted  . Morbid obesity ([place]) 06/04/2016  . PCOS (polycystic ovarian syndrome) 06/04/2016  . History of hypertension 06/04/2016  . History of hyperlipidemia 06/04/2016  . S/P bariatric surgery 05/25/2013  . Vitamin B12 deficiency anemia 10/22/2012  . H/O cesarean section complicating pregnancy [id number]  . Migraine without aura and without status migrainosus, not intractable 12/04/2011    Past  Surgical History:  Procedure Laterality Date  . APPENDECTOMY    . CESAREAN SECTION     X2  . DIAGNOSTIC LAPAROSCOPY    . ENDOMETRIAL BIOPSY    . GASTRIC BYPASS    . LASIK    . MOUTH SURGERY    . NOVASURE ABLATION N/A 06/29/2015   Procedure: DILATION AND CURETTAGE, HYSTEROSCOPY, NOVASURE ABLATION;  Surgeon: Malachy Mood, MD;  Location: [facility] ORS;  Service: Gynecology;  Laterality: N/A;  . TUBAL LIGATION    . WISDOM TOOTH EXTRACTION      Family History  Problem Relation Age of Onset  . Obesity Mother   . Hyperlipidemia Mother   . Obesity Sister   . Obesity Brother   . Obesity Sister     Social History   Social History  . Marital status: Married    Spouse name: N/A  . Number of children: N/A  . Years of education: N/A   Occupational History  . 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 Smoking status: Never Smoker  . Smokeless tobacco: Never Used  . Alcohol use Yes     Comment: RARE  . Drug use: No  . Sexual activity: Yes   Other Topics Concern  . Not on file   Social History Narrative   Lives with husband and two sons.    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      Current Outpatient Prescriptions:  Marland Kitchen  Multiple Vitamin (MULTIVITAMIN) capsule, Take 2 capsules by mouth daily., Disp: , Rfl:   Allergies  Allergen Reactions  . Vicodin [Hydrocodone-Acetaminophen] Nausea And Vomiting  . Codeine Other (See Comments)     ROS  Constitutional: Negative for fever , positive for weight change.  Respiratory: Negative for cough and shortness of breath.   Cardiovascular: Negative for chest pain or palpitations.  Gastrointestinal: Negative for abdominal pain, no bowel changes.  Musculoskeletal: Negative for gait problem or joint swelling.  Skin: Negative for rash.  Neurological: Negative for dizziness, intermittent headache.  No other specific complaints in a complete  review of systems (except as listed in HPI above).  Objective  Vitals:   06/04/16 1409  BP: 102/64  Pulse: 73  Resp: 16  Temp: 98.1 F (36.7 C)  TempSrc: Oral  SpO2: 98%  Weight: 210 lb 12.8 oz (95.6 kg)  Height: 5' 3.75" (1.619 m)    Body mass index is 36.47 kg/m.  Physical Exam  Constitutional: Patient appears well-developed and well-nourished. Obese  No distress.  HEENT: head atraumatic, normocephalic, pupils equal and reactive to light,  neck supple, throat within normal limits Cardiovascular: Normal rate, regular rhythm and normal heart sounds.  No murmur heard. No BLE edema. Pulmonary/Chest: Effort normal and breath sounds normal. No respiratory distress. Abdominal: Soft.  There is no tenderness. Psychiatric: Patient has a normal mood and affect. behavior is normal. Judgment and thought content normal.   Recent Results (from the past 2160 hour(s))  Basic metabolic panel     Status: None   Collection Time: 05/28/16 12:00 AM  Result Value Ref Range   Glucose 94 mg/dL  Lipid panel     Status: None   Collection Time: 05/28/16 12:00 AM  Result Value Ref Range   Triglycerides 106 40 - 160 mg/dL   Cholesterol 187 0 - 200 mg/dL   HDL 55 35 - 70 mg/dL   LDL Cholesterol 111 mg/dL  Hemoglobin A1c     Status: None   Collection Time: 05/28/16 12:00 AM  Result Value Ref Range   Hemoglobin A1C 5.0       PHQ2/9: Depression screen PHQ 2/9 06/04/2016  Decreased Interest 0  Down, Depressed, Hopeless 0  PHQ - 2 Score 0     Fall Risk: Fall Risk  06/04/2016  Falls in the past year? No    Functional Status Survey: Is the patient deaf or have difficulty hearing?: No Does the patient have difficulty seeing, even when wearing glasses/contacts?: No Does the patient have difficulty concentrating, remembering, or making decisions?: No Does the patient have difficulty walking or climbing stairs?: No Does the patient have difficulty dressing or bathing?: No Does the patient  have difficulty doing errands alone such as visiting a doctor's office or shopping?: No   Assessment & Plan  1. Morbid obesity, unspecified obesity type ([place])  Discussed all optiosns for weight loss medications including Belviq, Qsymia, Saxenda and Contrave. Discussed risk and benefits of each of them. She has taken Adipex given by GYN in the past, explained that helps with weight loss temporarily however not indicated for long term use - Naltrexone-Bupropion HCl ER (CONTRAVE) 8-90 MG TB12; Take 2 tablets by mouth 2 (two) times daily.  Dispense: 120 tablet; Refill: 1  2. History of hypertension  Resolved since bariatric surgery   3. Migraine without aura and without status migrainosus, not intractable  She will wean self off Topamax, because she is feeling so tired  4. S/P bariatric surgery  - Vitamin B1 - CBC with Differential/Platelet - Comprehensive Metabolic Panel (CMET) - VITAMIN D 25 Hydroxy (Vit-D Deficiency, Fractures)  5. Vitamin B12 deficiency anemia  - B12  6. Vitamin D deficiency  - VITAMIN D 25 Hydroxy (Vit-D Deficiency, Fractures)  7. Yeast infection of the skin  - ketoconazole (NIZORAL) 2 % cream; Apply 1 application topically daily.  Dispense: 15 g; Refill: 0

## 2016-06-06 ENCOUNTER — Telehealth: Payer: Self-pay | Admitting: Family Medicine

## 2016-06-06 LAB — CBC WITH DIFFERENTIAL/PLATELET
BASOS: 1 %
Basophils Absolute: 0.1 10*3/uL (ref 0.0–0.2)
EOS (ABSOLUTE): 0.2 10*3/uL (ref 0.0–0.4)
EOS: 2 %
HEMATOCRIT: 36.9 % (ref 34.0–46.6)
HEMOGLOBIN: 12.7 g/dL (ref 11.1–15.9)
IMMATURE GRANS (ABS): 0 10*3/uL (ref 0.0–0.1)
IMMATURE GRANULOCYTES: 0 %
LYMPHS: 39 %
Lymphocytes Absolute: 3.6 10*3/uL — ABNORMAL HIGH (ref 0.7–3.1)
MCH: 31 pg (ref 26.6–33.0)
MCHC: 34.4 g/dL (ref 31.5–35.7)
MCV: 90 fL (ref 79–97)
MONOCYTES: 5 %
MONOS ABS: 0.4 10*3/uL (ref 0.1–0.9)
NEUTROS PCT: 53 %
Neutrophils Absolute: 5 10*3/uL (ref 1.4–7.0)
Platelets: 346 10*3/uL (ref 150–379)
RBC: 4.1 x10E6/uL (ref 3.77–5.28)
RDW: 13.5 % (ref 12.3–15.4)
WBC: 9.2 10*3/uL (ref 3.4–10.8)

## 2016-06-06 LAB — COMPREHENSIVE METABOLIC PANEL
A/G RATIO: 1.7 (ref 1.2–2.2)
ALT: 18 IU/L (ref 0–32)
AST: 27 IU/L (ref 0–40)
Albumin: 4.2 g/dL (ref 3.5–5.5)
Alkaline Phosphatase: 88 IU/L (ref 39–117)
BUN/Creatinine Ratio: 10 (ref 9–23)
BUN: 7 mg/dL (ref 6–20)
Bilirubin Total: 0.2 mg/dL (ref 0.0–1.2)
CALCIUM: 9.5 mg/dL (ref 8.7–10.2)
CO2: 26 mmol/L (ref 18–29)
CREATININE: 0.68 mg/dL (ref 0.57–1.00)
Chloride: 103 mmol/L (ref 96–106)
GFR, EST AFRICAN AMERICAN: 137 mL/min/{1.73_m2} (ref >59)
GFR, EST NON AFRICAN AMERICAN: 119 mL/min/{1.73_m2} (ref >59)
GLOBULIN, TOTAL: 2.5 g/dL (ref 1.5–4.5)
Glucose: 88 mg/dL (ref 65–99)
POTASSIUM: 4.9 mmol/L (ref 3.5–5.2)
SODIUM: 143 mmol/L (ref 134–144)
TOTAL PROTEIN: 6.7 g/dL (ref 6.0–8.5)

## 2016-06-06 LAB — VITAMIN B1: Thiamine: 233 nmol/L — ABNORMAL HIGH (ref 66.5–200.0)

## 2016-06-06 LAB — VITAMIN B12: Vitamin B-12: 616 pg/mL (ref 211–946)

## 2016-06-06 LAB — VITAMIN D 25 HYDROXY (VIT D DEFICIENCY, FRACTURES): Vit D, 25-Hydroxy: 24.2 ng/mL — ABNORMAL LOW (ref 30.0–100.0)

## 2016-06-09 ENCOUNTER — Encounter: Payer: Self-pay | Admitting: Family Medicine

## 2016-06-23 ENCOUNTER — Encounter: Payer: Self-pay | Admitting: Family Medicine

## 2016-07-08 ENCOUNTER — Telehealth: Payer: Self-pay | Admitting: Family Medicine

## 2016-07-08 NOTE — Telephone Encounter (Signed)
I don't rx phentermine anymore. I am sorry. We can try Saxenda, injectable if it is covered by her insurance.

## 2016-07-08 NOTE — Telephone Encounter (Signed)
PT HAS QUESTIONS ON THE RX CONTRAVE THAT SHE IS TAKING. HAS NOT SEEN ANY CHANGES AND MAKES HER FEEL LOOPY AND OUT OF IT.SHE USE TO TAKE PHENTERMINE. WANTS TO KNOW IF MAYBE SHE COULD GO BACK TO THAT. pLEASE ADVISE

## 2016-07-09 NOTE — Telephone Encounter (Signed)
Patient called back. I read to her what Dr Ancil Boozer said. Patient states that she had already contacted her insurance company about Saxenda injectable and with insurance it will still cost her $1000. Would like to know if there is anything else she could try.

## 2016-07-09 NOTE — Telephone Encounter (Signed)
Contrave, Belviq or Qsymia

## 2016-07-09 NOTE — Telephone Encounter (Signed)
Called and left message for patient to return my call.

## 2016-08-01 ENCOUNTER — Telehealth: Payer: Self-pay | Admitting: Family Medicine

## 2016-08-01 NOTE — Telephone Encounter (Signed)
Requesting a return personal phone call from you. Would not give reason why.

## 2016-08-04 ENCOUNTER — Other Ambulatory Visit: Payer: Self-pay | Admitting: Family Medicine

## 2016-08-04 NOTE — Telephone Encounter (Signed)
Spoke to patient, she will come in for follow up tomorrow and 3:20 for migraine and obesity

## 2016-08-05 ENCOUNTER — Encounter: Payer: Self-pay | Admitting: Family Medicine

## 2016-08-05 ENCOUNTER — Ambulatory Visit (INDEPENDENT_AMBULATORY_CARE_PROVIDER_SITE_OTHER): Payer: 59 | Admitting: Family Medicine

## 2016-08-05 VITALS — BP 118/72 | HR 84 | Temp 98.2°F | Resp 16 | Ht 64.0 in | Wt 209.1 lb

## 2016-08-05 DIAGNOSIS — R7303 Prediabetes: Secondary | ICD-10-CM

## 2016-08-05 DIAGNOSIS — Z9884 Bariatric surgery status: Secondary | ICD-10-CM | POA: Diagnosis not present

## 2016-08-05 DIAGNOSIS — G43009 Migraine without aura, not intractable, without status migrainosus: Secondary | ICD-10-CM | POA: Diagnosis not present

## 2016-08-05 MED ORDER — PROMETHAZINE HCL 12.5 MG PO TABS: 12.5000 mg | ORAL_TABLET | Freq: Three times a day (TID) | ORAL | 0 refills | Status: DC | PRN

## 2016-08-05 MED ORDER — ATENOLOL 25 MG PO TABS: 25.0000 mg | ORAL_TABLET | Freq: Every day | ORAL | 0 refills | Status: DC

## 2016-08-05 MED ORDER — MAGNESIUM 400 MG PO CAPS: 1.0000 | ORAL_CAPSULE | Freq: Two times a day (BID) | ORAL | 0 refills | Status: DC

## 2016-08-05 MED ORDER — LIRAGLUTIDE 18 MG/3ML ~~LOC~~ SOPN: 0.6000 mg | PEN_INJECTOR | Freq: Every day | SUBCUTANEOUS | 2 refills | Status: DC

## 2016-08-05 MED ORDER — SUMATRIPTAN SUCCINATE 6 MG/0.5ML ~~LOC~~ SOLN: 6.0000 mg | SUBCUTANEOUS | 0 refills | Status: DC | PRN

## 2016-08-05 MED ORDER — INSULIN PEN NEEDLE 30G X 8 MM MISC: 1.0000 | 0 refills | Status: DC | PRN

## 2016-08-05 NOTE — Progress Notes (Signed)
Name: Jean Pitts   MRN: [id number]    DOB: [birth year]   Date:08/05/2016       Progress Note  Subjective  Chief Complaint  Chief Complaint  Patient presents with  . Migraine    been having them more frequently recently  . Obesity    HPI  Migraine headaches: she was here 2 months ago and at the time she stated  migraine was well controlled with Topamax, however she states she feels very tired. Taking medication around 8 pm. She states that over the past 3 weeks she has noticed worsening of symptoms. Missing work because of it, she states pain as throbbing and the entire head, gets associated nausea, vomiting and sometimes dizziness. She also has daily tension headaches and has been taking Excedrin migraine to control it ( however she had bypass surgery and is not supposed to take it) She states that Imitrex by mouth does no work, but injectable form works well for her. She has never seen a neurologist. She takes Tylenol almost daily for tension headaches. She states that since Topamax is no longer working she would like to stop taking it, advised her to wean self off. Discussed importance of stress control ( going to school full time, working, has two children, married )  Obesity: she wants phentermine, but explained that I don't prescribe Adipex for weight loss. Discussed other options, including Victoza since she has a history of PCOS and pre-diabetes and she is willing to try it   Patient Active Problem List   Diagnosis Date Noted  . Morbid obesity ([place]) 06/04/2016  . PCOS (polycystic ovarian syndrome) 06/04/2016  . History of hypertension 06/04/2016  . History of hyperlipidemia 06/04/2016  . S/P bariatric surgery 05/25/2013  . Vitamin B12 deficiency anemia 10/22/2012  . H/O cesarean section complicating pregnancy 99991111  . Migraine without aura and without status migrainosus, not intractable 12/04/2011    Past Surgical History:  Procedure Laterality Date  . APPENDECTOMY     . CESAREAN SECTION     X2  . DIAGNOSTIC LAPAROSCOPY    . ENDOMETRIAL BIOPSY    . GASTRIC BYPASS    . LASIK    . MOUTH SURGERY    . NOVASURE ABLATION N/A 06/29/2015   Procedure: DILATION AND CURETTAGE, HYSTEROSCOPY, NOVASURE ABLATION;  Surgeon: Malachy Mood, MD;  Location: [facility] ORS;  Service: Gynecology;  Laterality: N/A;  . TUBAL LIGATION    . WISDOM TOOTH EXTRACTION      Family History  Problem Relation Age of Onset  . Obesity Mother   . Hyperlipidemia Mother   . Obesity Sister   . Obesity Brother   . Obesity Sister     Social History   Social History  . Marital status: Married    Spouse name: N/A  . Number of children: N/A  . Years of education: N/A   Occupational History  . 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 Smoking status: Never Smoker  . Smokeless tobacco: Never Used  . Alcohol use Yes     Comment: RARE  . Drug use: No  . Sexual activity: Yes   Other Topics Concern  . Not on file   Social History Narrative   Lives with husband and two sons.    Working full time at [employer]   She also goes to school full time at [employer] and Geophysicist/field seismologist as a side business.      Current Outpatient Prescriptions:  .  atenolol (TENORMIN) 25 MG tablet, Take 1 tablet (25 mg total) by mouth at bedtime. For migraine headaches, Disp: 90 tablet, Rfl: 0 .  Insulin Pen Needle (NOVOFINE) 30G X 8 MM MISC, Inject 10 each into the skin as needed., Disp: 100 each, Rfl: 0 .  ketoconazole (NIZORAL) 2 % cream, Apply 1 application topically daily., Disp: 15 g, Rfl: 0 .  liraglutide (VICTOZA) 18 MG/3ML SOPN, Inject 0.1-0.3 mLs (0.6-1.8 mg total) into the skin daily., Disp: 9 mL, Rfl: 2 .  Magnesium 400 MG CAPS, Take 1 tablet by mouth 2 (two) times daily., Disp: 60 capsule, Rfl: 0 .  Multiple Vitamin (MULTIVITAMIN) capsule, Take 2 capsules by mouth daily., Disp: , Rfl:  .  promethazine (PHENERGAN) 12.5 MG tablet, Take 1 tablet (12.5 mg total) by mouth every 8  (eight) hours as needed for nausea or vomiting., Disp: 20 tablet, Rfl: 0 .  SUMAtriptan (IMITREX) 6 MG/0.5ML SOLN injection, Inject 0.5 mLs (6 mg total) into the skin every 2 (two) hours as needed for migraine or headache. May repeat in 2 hours if headache persists or recurs., Disp: 4.5 mL, Rfl: 0  Allergies  Allergen Reactions  . Vicodin [Hydrocodone-Acetaminophen] Nausea And Vomiting  . Codeine Other (See Comments)     ROS  Constitutional: Negative for fever or weight change.  Respiratory: Negative for cough and shortness of breath.   Cardiovascular: Negative for chest pain or palpitations.  Gastrointestinal: Negative for abdominal pain, no bowel changes.  Musculoskeletal: Negative for gait problem or joint swelling.  Skin: Negative for rash.  Neurological: Negative for dizziness , positive for  headache.  No other specific complaints in a complete review of systems (except as listed in HPI above).  Objective  Vitals:   08/05/16 1525  BP: 118/72  Pulse: 84  Resp: 16  Temp: 98.2 F (36.8 C)  TempSrc: Oral  SpO2: 98%  Weight: 209 lb 2 oz (94.9 kg)  Height: 5\' 4"  (1.626 m)    Body mass index is 35.9 kg/m.  Physical Exam  Constitutional: Patient appears well-developed and well-nourished. Obese  No distress.  HEENT: head atraumatic, normocephalic, pupils equal and reactive to light, , neck supple, throat within normal limits Cardiovascular: Normal rate, regular rhythm and normal heart sounds.  No murmur heard. No BLE edema. Pulmonary/Chest: Effort normal and breath sounds normal. No respiratory distress. Abdominal: Soft.  There is no tenderness. Psychiatric: Patient has a normal mood and affect. behavior is normal. Judgment and thought content normal. Neurological exam: normal   Recent Results (from the past 2160 hour(s))  Basic metabolic panel     Status: None   Collection Time: 05/28/16 12:00 AM  Result Value Ref Range   Glucose 94 mg/dL  Lipid panel     Status:  None   Collection Time: 05/28/16 12:00 AM  Result Value Ref Range   Triglycerides 106 40 - 160 mg/dL   Cholesterol 187 0 - 200 mg/dL   HDL 55 35 - 70 mg/dL   LDL Cholesterol 111 mg/dL  Hemoglobin A1c     Status: None   Collection Time: 05/28/16 12:00 AM  Result Value Ref Range   Hemoglobin A1C 5.0   Vitamin B1     Status: Abnormal   Collection Time: 06/04/16  3:49 PM  Result Value Ref Range   Thiamine 233.0 (H) 66.5 - 200.0 nmol/L  B12     Status: None   Collection Time: 06/04/16  3:49 PM  Result Value Ref Range   Vitamin  B-12 616 211 - 946 pg/mL  CBC with Differential/Platelet     Status: Abnormal   Collection Time: 06/04/16  3:49 PM  Result Value Ref Range   WBC 9.2 3.4 - 10.8 x10E3/uL   RBC 4.10 3.77 - 5.28 x10E6/uL   Hemoglobin 12.7 11.1 - 15.9 g/dL   Hematocrit 36.9 34.0 - 46.6 %   MCV 90 79 - 97 fL   MCH 31.0 26.6 - 33.0 pg   MCHC 34.4 31.5 - 35.7 g/dL   RDW 13.5 12.3 - 15.4 %   Platelets 346 150 - 379 x10E3/uL   Neutrophils 53 %   Lymphs 39 %   Monocytes 5 %   Eos 2 %   Basos 1 %   Neutrophils Absolute 5.0 1.4 - 7.0 x10E3/uL   Lymphocytes Absolute 3.6 (H) 0.7 - 3.1 x10E3/uL   Monocytes Absolute 0.4 0.1 - 0.9 x10E3/uL   EOS (ABSOLUTE) 0.2 0.0 - 0.4 x10E3/uL   Basophils Absolute 0.1 0.0 - 0.2 x10E3/uL   Immature Granulocytes 0 %   Immature Grans (Abs) 0.0 0.0 - 0.1 x10E3/uL  Comprehensive Metabolic Panel (CMET)     Status: None   Collection Time: 06/04/16  3:49 PM  Result Value Ref Range   Glucose 88 65 - 99 mg/dL   BUN 7 6 - 20 mg/dL   Creatinine, Ser 0.68 0.57 - 1.00 mg/dL   GFR calc non Af Amer 119 >59 mL/min/1.73   GFR calc Af Amer 137 >59 mL/min/1.73   BUN/Creatinine Ratio 10 9 - 23   Sodium 143 134 - 144 mmol/L   Potassium 4.9 3.5 - 5.2 mmol/L   Chloride 103 96 - 106 mmol/L   CO2 26 18 - 29 mmol/L   Calcium 9.5 8.7 - 10.2 mg/dL   Total Protein 6.7 6.0 - 8.5 g/dL   Albumin 4.2 3.5 - 5.5 g/dL   Globulin, Total 2.5 1.5 - 4.5 g/dL   Albumin/Globulin  Ratio 1.7 1.2 - 2.2   Bilirubin Total 0.2 0.0 - 1.2 mg/dL   Alkaline Phosphatase 88 39 - 117 IU/L   AST 27 0 - 40 IU/L   ALT 18 0 - 32 IU/L  VITAMIN D 25 Hydroxy (Vit-D Deficiency, Fractures)     Status: Abnormal   Collection Time: 06/04/16  3:49 PM  Result Value Ref Range   Vit D, 25-Hydroxy 24.2 (L) 30.0 - 100.0 ng/mL    Comment: Vitamin D deficiency has been defined by the Institute of Medicine and an Endocrine Society practice guideline as a level of serum 25-OH vitamin D less than 20 ng/mL (1,2). The Endocrine Society went on to further define vitamin D insufficiency as a level between 21 and 29 ng/mL (2). 1. IOM (Institute of Medicine). 2010. Dietary reference    intakes for calcium and D. [place]: The    [employer]. 2. Holick MF, Binkley [place], Bischoff-Ferrari HA, et al.    Evaluation, treatment, and prevention of vitamin D    deficiency: an Endocrine Society clinical practice    guideline. JCEM. 2011 Jul; 96(7):1911-30.       PHQ2/9: Depression screen PHQ 2/9 06/04/2016  Decreased Interest 0  Down, Depressed, Hopeless 0  PHQ - 2 Score 0     Fall Risk: Fall Risk  06/04/2016  Falls in the past year? No    Assessment & Plan  1. Migraine without aura and without status migrainosus, not intractable  - SUMAtriptan (IMITREX) 6 MG/0.5ML SOLN injection; Inject 0.5 mLs (6 mg  total) into the skin every 2 (two) hours as needed for migraine or headache. May repeat in 2 hours if headache persists or recurs.  Dispense: 4.5 mL; Refill: 0 - promethazine (PHENERGAN) 12.5 MG tablet; Take 1 tablet (12.5 mg total) by mouth every 8 (eight) hours as needed for nausea or vomiting.  Dispense: 20 tablet; Refill: 0 - atenolol (TENORMIN) 25 MG tablet; Take 1 tablet (25 mg total) by mouth at bedtime. For migraine headaches  Dispense: 90 tablet; Refill: 0 - Magnesium 400 MG CAPS; Take 1 tablet by mouth 2 (two) times daily.  Dispense: 60 capsule; Refill: 0  2. Morbid obesity,  unspecified obesity type ([place])  Discussed life style modification  - liraglutide (VICTOZA) 18 MG/3ML SOPN; Inject 0.1-0.3 mLs (0.6-1.8 mg total) into the skin daily.  Dispense: 9 mL; Refill: 2   3. S/P bariatric surgery   4. Pre-diabetes  - liraglutide (VICTOZA) 18 MG/3ML SOPN; Inject 0.1-0.3 mLs (0.6-1.8 mg total) into the skin daily.  Dispense: 9 mL; Refill: 2

## 2016-08-11 ENCOUNTER — Other Ambulatory Visit: Payer: Self-pay

## 2016-08-11 MED ORDER — METFORMIN HCL 500 MG PO TABS: 500.0000 mg | ORAL_TABLET | Freq: Two times a day (BID) | ORAL | 0 refills | Status: DC

## 2016-11-07 ENCOUNTER — Ambulatory Visit: Payer: 59 | Admitting: Family Medicine

## 2016-11-11 ENCOUNTER — Encounter (INDEPENDENT_AMBULATORY_CARE_PROVIDER_SITE_OTHER): Payer: Self-pay

## 2016-11-12 ENCOUNTER — Ambulatory Visit: Payer: 59

## 2017-02-06 ENCOUNTER — Encounter: Payer: Self-pay | Admitting: Emergency Medicine

## 2017-02-06 ENCOUNTER — Emergency Department: Payer: Medicaid Other

## 2017-02-06 ENCOUNTER — Emergency Department
Admission: EM | Admit: 2017-02-06 | Discharge: 2017-02-06 | Disposition: A | Discharge status: Home / Self Care | Payer: Medicaid Other | Attending: Emergency Medicine | Admitting: Emergency Medicine

## 2017-02-06 DIAGNOSIS — Y9389 Activity, other specified: Secondary | ICD-10-CM | POA: Diagnosis not present

## 2017-02-06 DIAGNOSIS — Z7984 Long term (current) use of oral hypoglycemic drugs: Secondary | ICD-10-CM | POA: Diagnosis not present

## 2017-02-06 DIAGNOSIS — S9031XA Contusion of right foot, initial encounter: Secondary | ICD-10-CM

## 2017-02-06 DIAGNOSIS — Y999 Unspecified external cause status: Secondary | ICD-10-CM | POA: Insufficient documentation

## 2017-02-06 DIAGNOSIS — Z79899 Other long term (current) drug therapy: Secondary | ICD-10-CM | POA: Diagnosis not present

## 2017-02-06 DIAGNOSIS — I10 Essential (primary) hypertension: Secondary | ICD-10-CM | POA: Diagnosis not present

## 2017-02-06 DIAGNOSIS — Y92410 Unspecified street and highway as the place of occurrence of the external cause: Secondary | ICD-10-CM | POA: Diagnosis not present

## 2017-02-06 DIAGNOSIS — S99921A Unspecified injury of right foot, initial encounter: Secondary | ICD-10-CM | POA: Diagnosis present
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
## 2017-02-06 MED ORDER — TRAMADOL HCL 50 MG PO TABS: 50.0000 mg | ORAL_TABLET | Freq: Four times a day (QID) | ORAL | 0 refills | Status: DC | PRN

## 2017-02-06 MED ORDER — IBUPROFEN 600 MG PO TABS: 600.0000 mg | ORAL_TABLET | Freq: Three times a day (TID) | ORAL | 0 refills | Status: DC | PRN
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
## 2017-02-06 NOTE — Discharge Instructions (Signed)
Ice and elevate to reduce swelling and pain. Ibuprofen 3 times a day with food. Tramadol  if needed for severe pain. Do not drive while taking Tramadol as this is a narcotic and can cause drowsiness. Follow-up with your primary care doctor if any continued problems.

## 2017-02-20 NOTE — Telephone Encounter (Signed)
ERRENOUS °

## 2017-04-29 ENCOUNTER — Telehealth: Payer: Self-pay | Admitting: Obstetrics and Gynecology

## 2017-04-29 NOTE — Telephone Encounter (Signed)
She's had a tubal and an ablation so she is very unlikely to be pregnant.  She can come by and do a urine or blood pregnancy test here but unless its positive she doesn't need a work in.  She does need to schedule a routine annual at some point

## 2017-04-29 NOTE — Telephone Encounter (Signed)
Pt is calling today wanting to schedule a lab for Pregnancy. Pt is stating she hasn't taken a home pregnancy because she always has false negatives, and has had her tubes tied. Please advise

## 2017-04-29 NOTE — Telephone Encounter (Signed)
PT aware needs an appointment, can order testing at that time

## 2017-04-29 NOTE — Telephone Encounter (Signed)
Pt is schedule on 05/06/17 with Opal Sidles

## 2017-04-29 NOTE — Telephone Encounter (Signed)
Pt is due for an annual. Last saw AMS 01/2016. Please advise

## 2017-04-30 NOTE — Telephone Encounter (Signed)
I will discuss with her at her visit.

## 2017-05-06 ENCOUNTER — Ambulatory Visit: Payer: Self-pay | Admitting: Advanced Practice Midwife

## 2017-07-01 ENCOUNTER — Telehealth: Payer: Self-pay

## 2017-07-01 NOTE — Telephone Encounter (Signed)
Pt requesting call back from AMS as she knows him well. She has a private issue that she is experiencing. She doesn't wish to disclose any information in this voice mail but would like to discuss w/AMS only.

## 2017-07-01 NOTE — Telephone Encounter (Signed)
Please call patient and advise

## 2017-07-07 NOTE — Telephone Encounter (Signed)
Missed AMS's call.  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  Would like for AMS to call her back.  [phone]

## 2017-07-07 NOTE — Telephone Encounter (Signed)
Please advise 

## 2017-07-08 ENCOUNTER — Emergency Department (INDEPENDENT_AMBULATORY_CARE_PROVIDER_SITE_OTHER)
Admission: EM | Admit: 2017-07-08 | Discharge: 2017-07-08 | Disposition: A | Discharge status: Home / Self Care | Payer: BC Managed Care – PPO | Source: Home / Self Care | Attending: Family Medicine | Admitting: Family Medicine

## 2017-07-08 ENCOUNTER — Encounter: Payer: Self-pay | Admitting: *Deleted

## 2017-07-08 DIAGNOSIS — J069 Acute upper respiratory infection, unspecified: Secondary | ICD-10-CM | POA: Diagnosis not present

## 2017-07-08 DIAGNOSIS — B9789 Other viral agents as the cause of diseases classified elsewhere: Secondary | ICD-10-CM

## 2017-07-08 LAB — POCT RAPID STREP A (OFFICE): Rapid Strep A Screen: NEGATIVE

## 2017-07-08 MED ORDER — BENZONATATE 100 MG PO CAPS: 100.0000 mg | ORAL_CAPSULE | Freq: Three times a day (TID) | ORAL | 0 refills | Status: DC

## 2017-07-08 MED ORDER — BENZOCAINE-MENTHOL 15-20 MG MT LOZG: 1.0000 | LOZENGE | Freq: Four times a day (QID) | OROMUCOSAL | 0 refills | Status: DC | PRN

## 2017-07-08 NOTE — Discharge Instructions (Signed)
°  You may take 500mg  acetaminophen every 4-6 hours for pain, inflammation, and fever.  Be sure to drink at least eight 8oz glasses of water to stay well hydrated and get at least 8 hours of sleep at night, preferably more while sick.

## 2017-07-08 NOTE — ED Provider Notes (Signed)
[name]    CSN: [id number] Arrival date & time: 07/08/17  0906     History   Chief Complaint Chief Complaint  Patient presents with  . Sore Throat  . Cough    HPI Jean Pitts is a 31 y.o. female.   HPI  Jean Pitts is a 31 y.o. female presenting to UC with c/o mild intermittent non-productive cough for 6 days and 1 day of sore throat and Left ear pain.  Associated body aches and fatigue. She also reports chills last night but did not check her temperature.  She took Tylenol last night but no pain medication today.  Denies n/v/d. No known sick contacts. She is scheduled to get her flu vaccine next week at work.  No recent travel.   Past Medical History:  Diagnosis Date  . B12 deficiency   . Family history of adverse reaction to anesthesia    MATERNAL AUNT-PT UNAWARE OF WHAT HAPPENED WITH ANESTHESIA BUT STATES HER AUNT HAS HAD PROBLEMS IN PAST   . Genital herpes   . Headache    MIGRAINE  . Hyperlipidemia   . Hypertension   . Migraine   . Obesity   . PONV (postoperative nausea and vomiting)   . Syncope 04-2015    Patient Active Problem List   Diagnosis Date Noted  . Morbid obesity ([place]) 06/04/2016  . PCOS (polycystic ovarian syndrome) 06/04/2016  . History of hypertension 06/04/2016  . History of hyperlipidemia 06/04/2016  . S/P bariatric surgery 05/25/2013  . Vitamin B12 deficiency anemia 10/22/2012  . H/O cesarean section complicating pregnancy 15/72/6203  . Migraine without aura and without status migrainosus, not intractable 12/04/2011    Past Surgical History:  Procedure Laterality Date  . APPENDECTOMY    . CESAREAN SECTION     X2  . DIAGNOSTIC LAPAROSCOPY    . ENDOMETRIAL BIOPSY    . GASTRIC BYPASS    . LASIK    . MOUTH SURGERY    . NOVASURE ABLATION N/A 06/29/2015   Procedure: DILATION AND CURETTAGE, HYSTEROSCOPY, NOVASURE ABLATION;  Surgeon: Malachy Mood, MD;  Location: [facility] ORS;  Service: Gynecology;  Laterality: N/A;  .  TUBAL LIGATION    . WISDOM TOOTH EXTRACTION      OB History    Gravida Para Term Preterm AB Living   2 2 2  0 0 2   SAB TAB Ectopic Multiple Live Births                  Obstetric Comments   Both cesarean for failure to progress First child pre-eclampsia       Home Medications    Prior to Admission medications   Medication Sig Start Date End Date Taking? Authorizing Provider  phentermine 37.5 MG capsule Take 37.5 mg by mouth every morning.   Yes [provider]  topiramate (TOPAMAX) 100 MG tablet Take 100 mg by mouth 2 (two) times daily.   Yes [provider]  atenolol (TENORMIN) 25 MG tablet Take 1 tablet (25 mg total) by mouth at bedtime. For migraine headaches 08/05/16   Steele Sizer, MD  Benzocaine-Menthol 15-20 MG LOZG Use as directed 1 lozenge in the mouth or throat 4 (four) times daily as needed. 07/08/17   Noe Gens, PA-C  benzonatate (TESSALON) 100 MG capsule Take 1-2 capsules (100-200 mg total) by mouth every 8 (eight) hours. 07/08/17   Noe Gens, PA-C  ibuprofen (ADVIL,MOTRIN) 600 MG tablet Take 1 tablet (600 mg total)  by mouth every 8 (eight) hours as needed. 02/06/17   Johnn Hai, PA-C  Insulin Pen Needle (NOVOFINE) 30G X 8 MM MISC Inject 10 each into the skin as needed. 08/05/16   Sowles, Drue Stager, MD  liraglutide (VICTOZA) 18 MG/3ML SOPN Inject 0.1-0.3 mLs (0.6-1.8 mg total) into the skin daily. 08/05/16   Steele Sizer, MD  Magnesium 400 MG CAPS Take 1 tablet by mouth 2 (two) times daily. 08/05/16   Steele Sizer, MD  metFORMIN (GLUCOPHAGE) 500 MG tablet Take 1 tablet (500 mg total) by mouth 2 (two) times daily with a meal. 08/11/16   Sowles, Drue Stager, MD  Multiple Vitamin (MULTIVITAMIN) capsule Take 2 capsules by mouth daily.    [provider]  SUMAtriptan (IMITREX) 6 MG/0.5ML SOLN injection Inject 0.5 mLs (6 mg total) into the skin every 2 (two) hours as needed for migraine or headache. May repeat in 2 hours if headache  persists or recurs. 08/05/16   Steele Sizer, MD  traMADol (ULTRAM) 50 MG tablet Take 1 tablet (50 mg total) by mouth every 6 (six) hours as needed. 02/06/17   Johnn Hai, PA-C    Family History Family History  Problem Relation Age of Onset  . Obesity Mother   . Hyperlipidemia Mother   . Obesity Sister   . Obesity Brother   . Obesity Sister     Social History Social History  Substance Use Topics  . Smoking status: Never Smoker  . Smokeless tobacco: Never Used  . Alcohol use Yes     Comment: RARE     Allergies   Vicodin [hydrocodone-acetaminophen] and Codeine   Review of Systems Review of Systems  Constitutional: Negative for chills and fever.  HENT: Positive for sore throat. Negative for congestion, ear pain, trouble swallowing and voice change.   Respiratory: Positive for cough. Negative for shortness of breath.   Cardiovascular: Negative for chest pain and palpitations.  Gastrointestinal: Negative for abdominal pain, diarrhea, nausea and vomiting.  Musculoskeletal: Positive for arthralgias, back pain and myalgias.       Body aches  Skin: Negative for rash.     Physical Exam Triage Vital Signs ED Triage Vitals  Enc Vitals Group     BP 07/08/17 0932 127/86     Pulse Rate 07/08/17 0932 96     Resp 07/08/17 0932 18     Temp 07/08/17 0932 98.3 F (36.8 C)     Temp Source 07/08/17 0932 Oral     SpO2 07/08/17 0932 100 %     Weight 07/08/17 0932 220 lb (99.8 kg)     Height 07/08/17 0932 5\' 4"  (1.626 m)     Head Circumference --      Peak Flow --      Pain Score 07/08/17 0933 6     Pain Loc --      Pain Edu? --      Excl. in [place]? --    No data found.   Updated Vital Signs BP 127/86 (BP Location: Left Arm)   Pulse 96   Temp 98.3 F (36.8 C) (Oral)   Resp 18   Ht 5\' 4"  (1.626 m)   Wt 220 lb (99.8 kg)   SpO2 100%   BMI 37.76 kg/m   Physical Exam  Constitutional: She is oriented to person, place, and time. She appears well-developed and  well-nourished. No distress.  HENT:  Head: Normocephalic and atraumatic.  Right Ear: Tympanic membrane is scarred. Tympanic membrane is not erythematous and  not bulging.  Left Ear: Tympanic membrane is scarred. Tympanic membrane is not erythematous and not bulging.  Nose: Nose normal.  Mouth/Throat: Uvula is midline and mucous membranes are normal. Posterior oropharyngeal erythema present. No oropharyngeal exudate, posterior oropharyngeal edema or tonsillar abscesses.  Eyes: EOM are normal.  Neck: Normal range of motion. Neck supple.  Cardiovascular: Normal rate and regular rhythm.   Pulmonary/Chest: Effort normal and breath sounds normal. No stridor. No respiratory distress. She has no wheezes. She has no rales.  Musculoskeletal: Normal range of motion.  Lymphadenopathy:    She has no cervical adenopathy.  Neurological: She is alert and oriented to person, place, and time.  Skin: Skin is warm and dry. She is not diaphoretic.  Psychiatric: She has a normal mood and affect. Her behavior is normal.  Nursing note and vitals reviewed.    UC Treatments / Results  Labs (all labs ordered are listed, but only abnormal results are displayed) Labs Reviewed  POCT RAPID STREP A (OFFICE)    EKG  EKG Interpretation None       Radiology No results found.  Procedures Procedures (including critical care time)  Medications Ordered in UC Medications - No data to display   Initial Impression / Assessment and Plan / UC Course  I have reviewed the triage vital signs and the nursing notes.  Pertinent labs & imaging results that were available during my care of the patient were reviewed by me and considered in my medical decision making (see chart for details).     Hx and exam c/w viral illness Encouraged symptomatic treatment F/u with PCP in 7-10 days if not improving, sooner if significantly worsening.  Final Clinical Impressions(s) / UC Diagnoses   Final diagnoses:  Viral upper  respiratory tract infection with cough    New Prescriptions Discharge Medication List as of 07/08/2017  9:45 AM    START taking these medications   Details  Benzocaine-Menthol 15-20 MG LOZG Use as directed 1 lozenge in the mouth or throat 4 (four) times daily as needed., Starting Wed 07/08/2017, Normal    benzonatate (TESSALON) 100 MG capsule Take 1-2 capsules (100-200 mg total) by mouth every 8 (eight) hours., Starting Wed 07/08/2017, Normal         Controlled Substance Prescriptions [place] Controlled Substance Registry consulted? Not Applicable   Tyrell Antonio 07/08/17 5009

## 2017-07-08 NOTE — ED Triage Notes (Signed)
Pt c/o cough x 6 days, sore throat and LT ear pain x 1 day. She reports chills last night, but she did not check her temperature.

## 2018-04-27 ENCOUNTER — Encounter: Payer: Self-pay | Admitting: Family Medicine

## 2018-04-27 ENCOUNTER — Other Ambulatory Visit: Payer: Self-pay | Admitting: Family Medicine

## 2018-04-27 DIAGNOSIS — E559 Vitamin D deficiency, unspecified: Secondary | ICD-10-CM

## 2018-04-27 DIAGNOSIS — Z113 Encounter for screening for infections with a predominantly sexual mode of transmission: Secondary | ICD-10-CM

## 2018-04-27 DIAGNOSIS — R7303 Prediabetes: Secondary | ICD-10-CM

## 2018-04-27 DIAGNOSIS — R5383 Other fatigue: Secondary | ICD-10-CM

## 2018-04-27 DIAGNOSIS — Z9884 Bariatric surgery status: Secondary | ICD-10-CM

## 2018-04-27 DIAGNOSIS — D518 Other vitamin B12 deficiency anemias: Secondary | ICD-10-CM

## 2018-04-27 DIAGNOSIS — Z8679 Personal history of other diseases of the circulatory system: Secondary | ICD-10-CM

## 2018-04-29 ENCOUNTER — Other Ambulatory Visit: Payer: Self-pay | Admitting: Family Medicine

## 2018-04-29 ENCOUNTER — Other Ambulatory Visit: Payer: Self-pay

## 2018-04-29 DIAGNOSIS — Z113 Encounter for screening for infections with a predominantly sexual mode of transmission: Secondary | ICD-10-CM

## 2018-04-29 DIAGNOSIS — D518 Other vitamin B12 deficiency anemias: Secondary | ICD-10-CM

## 2018-04-29 DIAGNOSIS — R7303 Prediabetes: Secondary | ICD-10-CM

## 2018-04-29 DIAGNOSIS — R5383 Other fatigue: Secondary | ICD-10-CM

## 2018-04-29 DIAGNOSIS — Z9884 Bariatric surgery status: Secondary | ICD-10-CM

## 2018-04-29 DIAGNOSIS — E559 Vitamin D deficiency, unspecified: Secondary | ICD-10-CM

## 2018-04-29 NOTE — Addendum Note (Signed)
Addended by: Inda Coke on: 04/29/2018 08:19 AM   Modules accepted: Orders

## 2018-04-29 NOTE — Addendum Note (Signed)
Addended by: Inda Coke on: 04/29/2018 08:18 AM   Modules accepted: Orders

## 2018-04-29 NOTE — Telephone Encounter (Signed)
Caller name: Brittney  Relation to pt: [employer] back number: [phone]   Reason for call:  Patient currently at [employer]  phone [phone] &  fax # [phone], please fax orders as soon as possible

## 2018-05-01 LAB — COMPREHENSIVE METABOLIC PANEL
ALK PHOS: 99 IU/L (ref 39–117)
ALT: 19 IU/L (ref 0–32)
AST: 23 IU/L (ref 0–40)
Albumin/Globulin Ratio: 1.6 (ref 1.2–2.2)
Albumin: 4.1 g/dL (ref 3.5–5.5)
BILIRUBIN TOTAL: 0.3 mg/dL (ref 0.0–1.2)
BUN/Creatinine Ratio: 12 (ref 9–23)
BUN: 9 mg/dL (ref 6–20)
CHLORIDE: 102 mmol/L (ref 96–106)
CO2: 23 mmol/L (ref 20–29)
Calcium: 9.3 mg/dL (ref 8.7–10.2)
Creatinine, Ser: 0.77 mg/dL (ref 0.57–1.00)
GFR calc Af Amer: 119 mL/min/{1.73_m2} (ref >59)
GFR calc non Af Amer: 103 mL/min/{1.73_m2} (ref >59)
GLUCOSE: 88 mg/dL (ref 65–99)
Globulin, Total: 2.5 g/dL (ref 1.5–4.5)
Potassium: 4.3 mmol/L (ref 3.5–5.2)
SODIUM: 139 mmol/L (ref 134–144)
Total Protein: 6.6 g/dL (ref 6.0–8.5)

## 2018-05-01 LAB — CBC WITH DIFFERENTIAL/PLATELET
BASOS ABS: 0 10*3/uL (ref 0.0–0.2)
Basos: 1 %
EOS (ABSOLUTE): 0.1 10*3/uL (ref 0.0–0.4)
Eos: 2 %
HEMOGLOBIN: 12.5 g/dL (ref 11.1–15.9)
Hematocrit: 36.9 % (ref 34.0–46.6)
Immature Grans (Abs): 0 10*3/uL (ref 0.0–0.1)
Immature Granulocytes: 0 %
LYMPHS ABS: 2 10*3/uL (ref 0.7–3.1)
Lymphs: 36 %
MCH: 31.2 pg (ref 26.6–33.0)
MCHC: 33.9 g/dL (ref 31.5–35.7)
MCV: 92 fL (ref 79–97)
MONOS ABS: 0.4 10*3/uL (ref 0.1–0.9)
Monocytes: 8 %
NEUTROS ABS: 2.9 10*3/uL (ref 1.4–7.0)
Neutrophils: 53 %
Platelets: 328 10*3/uL (ref 150–450)
RBC: 4.01 x10E6/uL (ref 3.77–5.28)
RDW: 13.1 % (ref 12.3–15.4)
WBC: 5.4 10*3/uL (ref 3.4–10.8)

## 2018-05-01 LAB — TSH: TSH: 1.21 u[IU]/mL (ref 0.450–4.500)

## 2018-05-01 LAB — HIV ANTIBODY (ROUTINE TESTING W REFLEX): HIV Screen 4th Generation wRfx: NONREACTIVE

## 2018-05-01 LAB — RPR: RPR: NONREACTIVE

## 2018-05-01 LAB — LIPID PANEL
CHOLESTEROL TOTAL: 200 mg/dL — AB (ref 100–199)
Chol/HDL Ratio: 3.5 ratio (ref 0.0–4.4)
HDL: 57 mg/dL (ref >39)
LDL Calculated: 121 mg/dL — ABNORMAL HIGH (ref 0–99)
Triglycerides: 108 mg/dL (ref 0–149)
VLDL Cholesterol Cal: 22 mg/dL (ref 5–40)

## 2018-05-01 LAB — GC/CHLAMYDIA PROBE AMP
CHLAMYDIA, DNA PROBE: NEGATIVE
Neisseria gonorrhoeae by PCR: NEGATIVE

## 2018-05-01 LAB — HEMOGLOBIN A1C
ESTIMATED AVERAGE GLUCOSE: 97 mg/dL
Hgb A1c MFr Bld: 5 % (ref 4.8–5.6)

## 2018-05-01 LAB — VITAMIN B12: Vitamin B-12: 428 pg/mL (ref 232–1245)

## 2018-05-01 LAB — VITAMIN D 25 HYDROXY (VIT D DEFICIENCY, FRACTURES): Vit D, 25-Hydroxy: 20.4 ng/mL — ABNORMAL LOW (ref 30.0–100.0)

## 2018-05-07 ENCOUNTER — Other Ambulatory Visit (HOSPITAL_COMMUNITY)
Admission: RE | Admit: 2018-05-07 | Discharge: 2018-05-07 | Disposition: A | Discharge status: Home / Self Care | Payer: BLUE CROSS/BLUE SHIELD | Source: Ambulatory Visit | Attending: Obstetrics and Gynecology | Admitting: Obstetrics and Gynecology

## 2018-05-07 ENCOUNTER — Encounter: Payer: Self-pay | Admitting: Obstetrics and Gynecology

## 2018-05-07 ENCOUNTER — Ambulatory Visit (INDEPENDENT_AMBULATORY_CARE_PROVIDER_SITE_OTHER): Payer: BLUE CROSS/BLUE SHIELD | Admitting: Obstetrics and Gynecology

## 2018-05-07 VITALS — BP 114/78 | HR 73 | Ht 64.0 in | Wt 222.0 lb

## 2018-05-07 DIAGNOSIS — Z1231 Encounter for screening mammogram for malignant neoplasm of breast: Secondary | ICD-10-CM

## 2018-05-07 DIAGNOSIS — Z113 Encounter for screening for infections with a predominantly sexual mode of transmission: Secondary | ICD-10-CM | POA: Insufficient documentation

## 2018-05-07 DIAGNOSIS — Z124 Encounter for screening for malignant neoplasm of cervix: Secondary | ICD-10-CM

## 2018-05-07 DIAGNOSIS — Z01419 Encounter for gynecological examination (general) (routine) without abnormal findings: Secondary | ICD-10-CM | POA: Diagnosis not present

## 2018-05-07 DIAGNOSIS — Z1239 Encounter for other screening for malignant neoplasm of breast: Secondary | ICD-10-CM

## 2018-05-07 NOTE — Progress Notes (Signed)
Gynecology Annual Exam   PCP: Steele Sizer, MD  Chief Complaint:  Chief Complaint  Patient presents with  . Gynecologic Exam    History of Present Illness: Patient is a 32 y.o. G2P2002 presents for annual exam. The patient has no complaints today.   LMP: No LMP recorded. Patient has had an ablation.   She has achieved almost complete amenorrhea.  Will occasional have some light vaginal spotting.    The patient is sexually active. She currently uses tubal ligation for contraception. She admits to occasional dyspareunia.  The patient does perform self breast exams.  There is no notable family history of breast or ovarian cancer in her family.  The patient wears seatbelts: yes.   The patient has regular exercise: not asked.    The patient denies current symptoms of depression.    Review of Systems: Review of Systems  Constitutional: Negative for chills and fever.  HENT: Negative for congestion.   Respiratory: Negative for cough and shortness of breath.   Cardiovascular: Negative for chest pain and palpitations.  Gastrointestinal: Negative for abdominal pain, constipation, diarrhea, heartburn, nausea and vomiting.  Genitourinary: Negative for dysuria, frequency and urgency.  Skin: Negative for itching and rash.  Neurological: Negative for dizziness and headaches.  Endo/Heme/Allergies: Negative for polydipsia.  Psychiatric/Behavioral: Negative for depression.    Past Medical History:  Past Medical History:  Diagnosis Date  . B12 deficiency   . Family history of adverse reaction to anesthesia    MATERNAL AUNT-PT UNAWARE OF WHAT HAPPENED WITH ANESTHESIA BUT STATES HER AUNT HAS HAD PROBLEMS IN PAST   . Genital herpes   . Headache    MIGRAINE  . Hyperlipidemia   . Hypertension   . Migraine   . Obesity   . PONV (postoperative nausea and vomiting)   . Syncope 04-2015    Past Surgical History:  Past Surgical History:  Procedure Laterality Date  . APPENDECTOMY    .  CESAREAN SECTION     X2  . DIAGNOSTIC LAPAROSCOPY    . ENDOMETRIAL BIOPSY    . GASTRIC BYPASS    . LASIK    . MOUTH SURGERY    . NOVASURE ABLATION N/A 06/29/2015   Procedure: DILATION AND CURETTAGE, HYSTEROSCOPY, NOVASURE ABLATION;  Surgeon: Malachy Mood, MD;  Location: [facility] ORS;  Service: Gynecology;  Laterality: N/A;  . TUBAL LIGATION    . WISDOM TOOTH EXTRACTION      Gynecologic History:  No LMP recorded. Patient has had an ablation. Contraception: tubal ligation Last Pap: Results were: 01/28/2016 no abnormalities  Novasure endometrial ablation 06/29/2015  Obstetric History: G2P2002  Family History:  Family History  Problem Relation Age of Onset  . Obesity Mother   . Hyperlipidemia Mother   . Obesity Sister   . Obesity Brother   . Obesity Sister     Social History:  Social History   Socioeconomic History  . Marital status: Single    Spouse name: Not on file  . Number of children: Not on file  . Years of education: Not on file  . Highest education level: Not on file  Occupational History  . Occupation: Actor: [employer]  . Financial resource strain: Not on file  . Food insecurity:    Worry: Not on file    Inability: Not on file  . Transportation needs:    Medical: Not on file    Non-medical: Not on file  Tobacco  Use  . Smoking status: Never Smoker  . Smokeless tobacco: Never Used  Substance and Sexual Activity  . Alcohol use: Yes    Comment: RARE  . Drug use: No  . Sexual activity: Yes  Lifestyle  . Physical activity:    Days per week: Not on file    Minutes per session: Not on file  . Stress: Not on file  Relationships  . Social connections:    Talks on phone: Not on file    Gets together: Not on file    Attends religious service: Not on file    Active member of club or organization: Not on file    Attends meetings of clubs or organizations: Not on file    Relationship status: Not on file  .  Intimate partner violence:    Fear of current or ex partner: Not on file    Emotionally abused: Not on file    Physically abused: Not on file    Forced sexual activity: Not on file  Other Topics Concern  . Not on file  Social History Narrative      She also goes to school full time at [employer]    Allergies:  Allergies  Allergen Reactions  . Vicodin [Hydrocodone-Acetaminophen] Nausea And Vomiting  . Codeine Other (See Comments)    Medications: Prior to Admission medications   Medication Sig Start Date End Date Taking? Authorizing Provider  atenolol (TENORMIN) 25 MG tablet Take 1 tablet (25 mg total) by mouth at bedtime. For migraine headaches 08/05/16  Yes Sowles, Drue Stager, MD  Multiple Vitamin (MULTIVITAMIN) capsule Take 2 capsules by mouth daily.   Yes [provider]  phentermine 37.5 MG capsule Take 37.5 mg by mouth every morning.   Yes [provider]  SUMAtriptan (IMITREX) 6 MG/0.5ML SOLN injection Inject 0.5 mLs (6 mg total) into the skin every 2 (two) hours as needed for migraine or headache. May repeat in 2 hours if headache persists or recurs. 08/05/16  Yes Sowles, Drue Stager, MD  topiramate (TOPAMAX) 100 MG tablet Take 100 mg by mouth 2 (two) times daily.   Yes [provider]    Physical Exam Vitals: Blood pressure 114/78, pulse 73, height 5\' 4"  (1.626 m), weight 222 lb (100.7 kg). +23lbs in past 2 years Body mass index is 38.11 kg/m.   General: NAD HEENT: normocephalic, anicteric Thyroid: no enlargement, no palpable nodules Pulmonary: No increased work of breathing, CTAB Cardiovascular: RRR, distal pulses 2+ Breast: Breast symmetrical, no tenderness, no palpable nodules or masses, no skin or nipple retraction present, no nipple discharge.  No axillary or supraclavicular lymphadenopathy. Abdomen: NABS, soft, non-tender, non-distended.  Umbilicus without lesions.  No hepatomegaly, splenomegaly or masses palpable. No evidence of hernia   Genitourinary:  External: Normal external female genitalia.  Normal urethral meatus, normal Bartholin's and Skene's glands.    Vagina: Normal vaginal mucosa, no evidence of prolapse.    Cervix: Grossly normal in appearance, no bleeding  Uterus: Non-enlarged, mobile, normal contour.  No CMT  Adnexa: ovaries non-enlarged, no adnexal masses  Rectal: deferred  Lymphatic: no evidence of inguinal lymphadenopathy Extremities: no edema, erythema, or tenderness Neurologic: Grossly intact Psychiatric: mood appropriate, affect full  Female chaperone present for pelvic and breast  portions of the physical exam    Assessment: 32 y.o. [id number] routine annual exam  Plan: Problem List Items Addressed This Visit    None    Visit Diagnoses    Encounter for gynecological examination without abnormal finding    -  Primary   Routine screening for STI (sexually transmitted infection)       Relevant Orders   Cytology - PAP   Breast screening       Screening for malignant neoplasm of cervix       Relevant Orders   Cytology - PAP      2) STI screening  wasoffered and accepted GC/CT previously obtained blood testing  2)  ASCCP guidelines and rational discussed.  Patient opts for every 3 years screening interval  3) Contraception - the patient is currently using  tubal ligation.  She is happy with her current form of contraception and plans to continue  4) Routine healthcare maintenance including cholesterol, diabetes screening discussed managed by PCP  5) Return in about 1 year (around 05/08/2019) for annual.   Malachy Mood, MD, [place], [place] 05/07/2018, 3:09 PM

## 2018-05-11 LAB — CYTOLOGY - PAP
Chlamydia: NEGATIVE
DIAGNOSIS: NEGATIVE
HPV (WINDOPATH): NOT DETECTED
NEISSERIA GONORRHEA: NEGATIVE

## 2018-05-12 ENCOUNTER — Encounter: Payer: Self-pay | Admitting: Family Medicine

## 2018-05-12 ENCOUNTER — Ambulatory Visit (INDEPENDENT_AMBULATORY_CARE_PROVIDER_SITE_OTHER): Payer: BLUE CROSS/BLUE SHIELD | Admitting: Family Medicine

## 2018-05-12 VITALS — BP 106/68 | HR 97 | Temp 98.3°F | Resp 16 | Ht 64.0 in | Wt 221.0 lb

## 2018-05-12 DIAGNOSIS — R5383 Other fatigue: Secondary | ICD-10-CM | POA: Diagnosis not present

## 2018-05-12 DIAGNOSIS — M21611 Bunion of right foot: Secondary | ICD-10-CM

## 2018-05-12 DIAGNOSIS — L659 Nonscarring hair loss, unspecified: Secondary | ICD-10-CM | POA: Insufficient documentation

## 2018-05-12 DIAGNOSIS — R7303 Prediabetes: Secondary | ICD-10-CM

## 2018-05-12 DIAGNOSIS — G43009 Migraine without aura, not intractable, without status migrainosus: Secondary | ICD-10-CM

## 2018-05-12 DIAGNOSIS — Z9884 Bariatric surgery status: Secondary | ICD-10-CM | POA: Diagnosis not present

## 2018-05-12 DIAGNOSIS — R519 Headache, unspecified: Secondary | ICD-10-CM

## 2018-05-12 DIAGNOSIS — E559 Vitamin D deficiency, unspecified: Secondary | ICD-10-CM

## 2018-05-12 DIAGNOSIS — R51 Headache: Secondary | ICD-10-CM

## 2018-05-12 HISTORY — DX: Nonscarring hair loss, unspecified: L65.9

## 2018-05-12 MED ORDER — SUMATRIPTAN 20 MG/ACT NA SOLN: 20.0000 mg | NASAL | 0 refills | Status: DC | PRN

## 2018-05-12 MED ORDER — RIZATRIPTAN BENZOATE 10 MG PO TBDP: 10.0000 mg | ORAL_TABLET | ORAL | 0 refills | Status: DC | PRN

## 2018-05-12 MED ORDER — VITAMIN D (ERGOCALCIFEROL) 1.25 MG (50000 UNIT) PO CAPS
50000.0000 [IU] | ORAL_CAPSULE | ORAL | 0 refills | Status: DC
Start: 2018-05-12 — End: 2019-08-02

## 2018-05-12 MED ORDER — NORTRIPTYLINE HCL 10 MG PO CAPS: 10.0000 mg | ORAL_CAPSULE | Freq: Every day | ORAL | 2 refills | Status: DC

## 2018-05-12 NOTE — Patient Instructions (Signed)
Migraine Headache A migraine headache is an intense, throbbing pain on one side or both sides of the head. Migraines may also cause other symptoms, such as nausea, vomiting, and sensitivity to light and noise. What are the causes? Doing or taking certain things may also trigger migraines, such as:  Alcohol.  Smoking.  Medicines, such as: ? Medicine used to treat chest pain (nitroglycerine). ? Birth control pills. ? Estrogen pills. ? Certain blood pressure medicines.  Aged cheeses, chocolate, or caffeine.  Foods or drinks that contain nitrates, glutamate, aspartame, or tyramine.  Physical activity.  Other things that may trigger a migraine include:  Menstruation.  Pregnancy.  Hunger.  Stress, lack of sleep, too much sleep, or fatigue.  Weather changes.  What increases the risk? The following factors may make you more likely to experience migraine headaches:  Age. Risk increases with age.  Family history of migraine headaches.  Being Caucasian.  Depression and anxiety.  Obesity.  Being a woman.  Having a hole in the heart (patent foramen ovale) or other heart problems.  What are the signs or symptoms? The main symptom of this condition is pulsating or throbbing pain. Pain may:  Happen in any area of the head, such as on one side or both sides.  Interfere with daily activities.  Get worse with physical activity.  Get worse with exposure to bright lights or loud noises.  Other symptoms may include:  Nausea.  Vomiting.  Dizziness.  General sensitivity to bright lights, loud noises, or smells.  Before you get a migraine, you may get warning signs that a migraine is developing (aura). An aura may include:  Seeing flashing lights or having blind spots.  Seeing bright spots, halos, or zigzag lines.  Having tunnel vision or blurred vision.  Having numbness or a tingling feeling.  Having trouble talking.  Having muscle weakness.  How is this  diagnosed? A migraine headache can be diagnosed based on:  Your symptoms.  A physical exam.  Tests, such as CT scan or MRI of the head. These imaging tests can help rule out other causes of headaches.  Taking fluid from the spine (lumbar puncture) and analyzing it (cerebrospinal fluid analysis, or CSF analysis).  How is this treated? A migraine headache is usually treated with medicines that:  Relieve pain.  Relieve nausea.  Prevent migraines from coming back.  Treatment may also include:  Acupuncture.  Lifestyle changes like avoiding foods that trigger migraines.  Follow these instructions at home: Medicines  Take over-the-counter and prescription medicines only as told by your health care provider.  Do not drive or use heavy machinery while taking prescription pain medicine.  To prevent or treat constipation while you are taking prescription pain medicine, your health care provider may recommend that you: ? Drink enough fluid to keep your urine clear or pale yellow. ? Take over-the-counter or prescription medicines. ? Eat foods that are high in fiber, such as fresh fruits and vegetables, whole grains, and beans. ? Limit foods that are high in fat and processed sugars, such as fried and sweet foods. Lifestyle  Avoid alcohol use.  Do not use any products that contain nicotine or tobacco, such as cigarettes and e-cigarettes. If you need help quitting, ask your health care provider.  Get at least 8 hours of sleep every night.  Limit your stress. General instructions   Keep a journal to find out what may trigger your migraine headaches. For example, write down: ? What you eat and   drink. ? How much sleep you get. ? Any change to your diet or medicines.  If you have a migraine: ? Avoid things that make your symptoms worse, such as bright lights. ? It may help to lie down in a dark, quiet room. ? Do not drive or use heavy machinery. ? Ask your health care provider  what activities are safe for you while you are experiencing symptoms.  Keep all follow-up visits as told by your health care provider. This is important. Contact a health care provider if:  You develop symptoms that are different or more severe than your usual migraine symptoms. Get help right away if:  Your migraine becomes severe.  You have a fever.  You have a stiff neck.  You have vision loss.  Your muscles feel weak or like you cannot control them.  You start to lose your balance often.  You develop trouble walking.  You faint. This information is not intended to replace advice given to you by your health care provider. Make sure you discuss any questions you have with your health care provider. Document Released: 09/15/2005 Document Revised: 04/04/2016 Document Reviewed: 03/03/2016 Elsevier Interactive Patient Education  2017 [place]. Analgesic Rebound Headache An analgesic rebound headache, sometimes called a medication overuse headache, is a headache that comes after pain medicine (analgesic) taken to treat the original (primary) headache has worn off. Any type of primary headache can return as a rebound headache if a person regularly takes analgesics more than three times a week to treat it. The types of primary headaches that are commonly associated with rebound headaches include:  Migraines.  Headaches that arise from tense muscles in the head and neck area (tension headaches).  Headaches that develop and happen again (recur) on one side of the head and around the eye (cluster headaches).  If rebound headaches continue, they become chronic daily headaches. What are the causes? This condition may be caused by frequent use of:  Over-the-counter medicines such as aspirin, ibuprofen, and acetaminophen.  Sinus relief medicines and other medicines that contain caffeine.  Narcotic pain medicines such as codeine and oxycodone.  What are the signs or  symptoms? The symptoms of a rebound headache are the same as the symptoms of the original headache. Some of the symptoms of specific types of headaches include: Migraine headache  Pulsing or throbbing pain on one or both sides of the head.  Severe pain that interferes with daily activities.  Pain that is worsened by physical activity.  Nausea, vomiting, or both.  Pain with exposure to bright light, loud noises, or strong smells.  General sensitivity to bright light, loud noises, or strong smells.  Visual changes.  Numbness of one or both arms. Tension headache  Pressure around the head.  Dull, aching head pain.  Pain felt over the front and sides of the head.  Tenderness in the muscles of the head, neck, and shoulders. Cluster headache  Severe pain that begins in or around one eye or temple.  Redness and tearing in the eye on the same side as the pain.  Droopy or swollen eyelid.  One-sided head pain.  Nausea.  Runny nose.  Sweaty, pale facial skin.  Restlessness. How is this diagnosed? This condition is diagnosed by:  Reviewing your medical history. This includes the nature of your primary headaches.  Reviewing the types of pain medicines that you have been using to treat your headaches and how often you take them.  How is this treated?  This condition may be treated or managed by:  Discontinuing frequent use of the analgesic medicine. Doing this may worsen your headaches at first, but the pain should eventually become more manageable, less frequent, and less severe.  Seeing a headache specialist. He or she may be able to help you manage your headaches and help make sure there is not another cause of the headaches.  Using methods of stress relief, such as acupuncture, counseling, biofeedback, and massage. Talk with your health care provider about which methods might be good for you.  Follow these instructions at home:  Take over-the-counter and  prescription medicines only as told by your health care provider.  Stop the repeated use of pain medicine as told by your health care provider. Stopping can be difficult. Carefully follow instructions from your health care provider.  Avoid triggers that are known to cause your primary headaches.  Keep all follow-up visits as told by your health care provider. This is important. Contact a health care provider if:  You continue to experience headaches after following treatments that your health care provider recommended. Get help right away if:  You develop new headache pain.  You develop headache pain that is different than what you have experienced in the past.  You develop numbness or tingling in your arms or legs.  You develop changes in your speech or vision. This information is not intended to replace advice given to you by your health care provider. Make sure you discuss any questions you have with your health care provider. Document Released: 12/06/2003 Document Revised: 04/04/2016 Document Reviewed: 02/18/2016 Elsevier Interactive Patient Education  [employer].

## 2018-05-12 NOTE — Progress Notes (Signed)
Name: Jean Pitts   MRN: [id number]    DOB: 25-Oct-1985   Date:05/12/2018       Progress Note  Subjective  Chief Complaint  Chief Complaint  Patient presents with  . Follow-up  . Referral    podiatrist for right foot pain  . Medication Refill    atenolo    HPI  Migraine headache: she does not have aura, but during a migraine episode she can have scotomas, nausea, vomiting, photophobia and phonophobia. She has been using imitrex injectable but it is very expensive. Episodes of migraine ( pounding, throbbing ) is about once or twice month, however also has daily headaches. Wakes up with a headache most morning. She tries not to take Excedrin migraine, at times takes Tylenol She has been weaning off caffeine , discussed importance of increasing water intake and manage stress better.   Obesity s/p bypass surgery: she is still 78 lbs lighter than the day of surgery ( based on bariatric surgeon's note), she was drinking sodas again and has gained weight since last visit, but has resumed a healthier diet. She is on phentermine and topamax also.   B12 and vitamin D deficiency: we will resume supplementation, last B12 was at goal  Fatigue/hair thinning: explained that she has a lot of on her plate, 2 children, works full time and goes to school full time.   Right bunion: she states the right side is getting more painful and difficulty closed toe shoe, she would like to see podiatrist.    Patient Active Problem List   Diagnosis Date Noted  . Hair thinning 05/12/2018  . Morbid obesity ([place]) 06/04/2016  . PCOS (polycystic ovarian syndrome) 06/04/2016  . History of hypertension 06/04/2016  . History of hyperlipidemia 06/04/2016  . S/P bariatric surgery 05/25/2013  . Vitamin B12 deficiency anemia 10/22/2012  . H/O cesarean section complicating pregnancy 46/27/0350  . Migraine without aura and without status migrainosus, not intractable 12/04/2011    Past Surgical History:  Procedure  Laterality Date  . APPENDECTOMY    . CESAREAN SECTION     X2  . DIAGNOSTIC LAPAROSCOPY    . ENDOMETRIAL BIOPSY    . GASTRIC BYPASS    . LASIK    . MOUTH SURGERY    . NOVASURE ABLATION N/A 06/29/2015   Procedure: DILATION AND CURETTAGE, HYSTEROSCOPY, NOVASURE ABLATION;  Surgeon: Malachy Mood, MD;  Location: [facility] ORS;  Service: Gynecology;  Laterality: N/A;  . TUBAL LIGATION    . WISDOM TOOTH EXTRACTION      Family History  Problem Relation Age of Onset  . Obesity Mother   . Hyperlipidemia Mother   . Obesity Sister   . Obesity Brother   . Obesity Sister     Social History   Socioeconomic History  . Marital status: Significant Other    Spouse name: Not on file  . Number of children: 2  . Years of education: Not on file  . Highest education level: Some college, no degree  Occupational History  . Occupation: Multimedia programmer   Social Needs  . Financial resource strain: Patient refused  . Food insecurity:    Worry: Never true    Inability: Never true  . Transportation needs:    Medical: No    Non-medical: No  Tobacco Use  . Smoking status: Never Smoker  . Smokeless tobacco: Never Used  Substance and Sexual Activity  . Alcohol use: Yes    Comment: RARE  . Drug use: No  .  Sexual activity: Yes    Partners: Male  Lifestyle  . Physical activity:    Days per week: 0 days    Minutes per session: 0 min  . Stress: Not at all  Relationships  . Social connections:    Talks on phone: More than three times a week    Gets together: More than three times a week    Attends religious service: More than 4 times per year    Active member of club or organization: No    Attends meetings of clubs or organizations: Never    Relationship status: Not on file  . Intimate partner violence:    Fear of current or ex partner: No    Emotionally abused: No    Physically abused: No    Forced sexual activity: No  Other Topics Concern  . Not on file  Social History Narrative       She also goes to school full time at [employer]     Current Outpatient Medications:  Marland Kitchen  Multiple Vitamin (MULTIVITAMIN) capsule, Take 2 capsules by mouth daily., Disp: , Rfl:  .  phentermine 37.5 MG capsule, Take 37.5 mg by mouth every morning., Disp: , Rfl:  .  topiramate (TOPAMAX) 100 MG tablet, Take 100 mg by mouth 2 (two) times daily., Disp: , Rfl:  .  nortriptyline (PAMELOR) 10 MG capsule, Take 1 capsule (10 mg total) by mouth at bedtime., Disp: 30 capsule, Rfl: 2 .  rizatriptan (MAXALT-MLT) 10 MG disintegrating tablet, Take 1 tablet (10 mg total) by mouth as needed for migraine. May repeat in 2 hours if needed, Disp: 10 tablet, Rfl: 0 .  SUMAtriptan (IMITREX) 20 MG/ACT nasal spray, Place 1 spray (20 mg total) into the nose every 2 (two) hours as needed for migraine or headache. No more than 2 sprays in 24 hours, Disp: 3 Inhaler, Rfl: 0 .  Vitamin D, Ergocalciferol, (DRISDOL) 50000 units CAPS capsule, Take 1 capsule (50,000 Units total) by mouth every 7 (seven) days., Disp: 12 capsule, Rfl: 0  Allergies  Allergen Reactions  . Vicodin [Hydrocodone-Acetaminophen] Nausea And Vomiting  . Codeine Other (See Comments)     ROS  Constitutional: Negative for fever, positive for weight change.  Respiratory: Negative for cough and shortness of breath.   Cardiovascular: Negative for chest pain or palpitations.  Gastrointestinal: Negative for abdominal pain, no bowel changes.  Musculoskeletal: Negative for gait problem or joint swelling.  Skin: Negative for rash.  Neurological: Negative for dizziness, positive for  headache.  No other specific complaints in a complete review of systems (except as listed in HPI above).  Objective  Vitals:   05/12/18 1348  BP: 106/68  Pulse: 97  Resp: 16  Temp: 98.3 F (36.8 C)  TempSrc: Oral  SpO2: 98%  Weight: 221 lb (100.2 kg)  Height: 5\' 4"  (1.626 m)    Body mass index is 37.93 kg/m.  Physical Exam  Constitutional: Patient appears  well-developed and well-nourished. Obese  No distress.  HEENT: head atraumatic, normocephalic, pupils equal and reactive to light, eck supple, throat within normal limits Cardiovascular: Normal rate, regular rhythm and normal heart sounds.  No murmur heard. No BLE edema. Pulmonary/Chest: Effort normal and breath sounds normal. No respiratory distress. Abdominal: Soft.  There is no tenderness. Psychiatric: Patient has a normal mood and affect. behavior is normal. Judgment and thought content normal. Muscular skeletal: bunion both feet , right worse than left  Neurological: normal cranial nerves, no focal deficit   Recent  Results (from the past 2160 hour(s))  CBC with Differential/Platelet     Status: None   Collection Time: 04/29/18  9:57 AM  Result Value Ref Range   WBC 5.4 3.4 - 10.8 x10E3/uL   RBC 4.01 3.77 - 5.28 x10E6/uL   Hemoglobin 12.5 11.1 - 15.9 g/dL   Hematocrit 36.9 34.0 - 46.6 %   MCV 92 79 - 97 fL   MCH 31.2 26.6 - 33.0 pg   MCHC 33.9 31.5 - 35.7 g/dL   RDW 13.1 12.3 - 15.4 %   Platelets 328 150 - 450 x10E3/uL   Neutrophils 53 Not Estab. %   Lymphs 36 Not Estab. %   Monocytes 8 Not Estab. %   Eos 2 Not Estab. %   Basos 1 Not Estab. %   Neutrophils Absolute 2.9 1.4 - 7.0 x10E3/uL   Lymphocytes Absolute 2.0 0.7 - 3.1 x10E3/uL   Monocytes Absolute 0.4 0.1 - 0.9 x10E3/uL   EOS (ABSOLUTE) 0.1 0.0 - 0.4 x10E3/uL   Basophils Absolute 0.0 0.0 - 0.2 x10E3/uL   Immature Granulocytes 0 Not Estab. %   Immature Grans (Abs) 0.0 0.0 - 0.1 x10E3/uL  Comprehensive metabolic panel     Status: None   Collection Time: 04/29/18  9:57 AM  Result Value Ref Range   Glucose 88 65 - 99 mg/dL   BUN 9 6 - 20 mg/dL   Creatinine, Ser 0.77 0.57 - 1.00 mg/dL   GFR calc non Af Amer 103 >59 mL/min/1.73   GFR calc Af Amer 119 >59 mL/min/1.73   BUN/Creatinine Ratio 12 9 - 23   Sodium 139 134 - 144 mmol/L   Potassium 4.3 3.5 - 5.2 mmol/L   Chloride 102 96 - 106 mmol/L   CO2 23 20 - 29 mmol/L    Calcium 9.3 8.7 - 10.2 mg/dL   Total Protein 6.6 6.0 - 8.5 g/dL   Albumin 4.1 3.5 - 5.5 g/dL   Globulin, Total 2.5 1.5 - 4.5 g/dL   Albumin/Globulin Ratio 1.6 1.2 - 2.2   Bilirubin Total 0.3 0.0 - 1.2 mg/dL   Alkaline Phosphatase 99 39 - 117 IU/L   AST 23 0 - 40 IU/L   ALT 19 0 - 32 IU/L  Lipid panel     Status: Abnormal   Collection Time: 04/29/18  9:57 AM  Result Value Ref Range   Cholesterol, Total 200 (H) 100 - 199 mg/dL   Triglycerides 108 0 - 149 mg/dL   HDL 57 >39 mg/dL   VLDL Cholesterol Cal 22 5 - 40 mg/dL   LDL Calculated 121 (H) 0 - 99 mg/dL   Chol/HDL Ratio 3.5 0.0 - 4.4 ratio    Comment:                                   T. Chol/HDL Ratio                                             Men  Women                               1/2 Avg.Risk  3.4    3.3  Avg.Risk  5.0    4.4                                2X Avg.Risk  9.6    7.1                                3X Avg.Risk 23.4   11.0   Hemoglobin A1c     Status: None   Collection Time: 04/29/18  9:57 AM  Result Value Ref Range   Hgb A1c MFr Bld 5.0 4.8 - 5.6 %    Comment:          Prediabetes: 5.7 - 6.4          Diabetes: >6.4          Glycemic control for adults with diabetes: <7.0    Est. average glucose Bld gHb Est-mCnc 97 mg/dL  GC/Chlamydia Probe Amp     Status: None   Collection Time: 04/29/18  9:57 AM  Result Value Ref Range   Chlamydia trachomatis, NAA Negative Negative   Neisseria gonorrhoeae by PCR Negative Negative  TSH     Status: None   Collection Time: 04/29/18  9:57 AM  Result Value Ref Range   TSH 1.210 0.450 - 4.500 uIU/mL  RPR     Status: None   Collection Time: 04/29/18  9:57 AM  Result Value Ref Range   RPR Ser Ql Non Reactive Non Reactive  VITAMIN D 25 Hydroxy (Vit-D Deficiency, Fractures)     Status: Abnormal   Collection Time: 04/29/18  9:57 AM  Result Value Ref Range   Vit D, 25-Hydroxy 20.4 (L) 30.0 - 100.0 ng/mL    Comment: Vitamin D deficiency has been  defined by the [place] and an Endocrine Society practice guideline as a level of serum 25-OH vitamin D less than 20 ng/mL (1,2). The Endocrine Society went on to further define vitamin D insufficiency as a level between 21 and 29 ng/mL (2). 1. IOM (Institute of Medicine). 2010. Dietary reference    intakes for calcium and D. [place]: The    [employer]. 2. Holick MF, Binkley [place], Bischoff-Ferrari HA, et al.    Evaluation, treatment, and prevention of vitamin D    deficiency: an Endocrine Society clinical practice    guideline. JCEM. 2011 Jul; 96(7):1911-30.   HIV antibody     Status: None   Collection Time: 04/29/18  9:57 AM  Result Value Ref Range   HIV Screen 4th Generation wRfx Non Reactive Non Reactive  Vitamin B12     Status: None   Collection Time: 04/29/18  9:57 AM  Result Value Ref Range   Vitamin B-12 428 232 - 1,245 pg/mL  Cytology - PAP     Status: None   Collection Time: 05/07/18 12:00 AM  Result Value Ref Range   Adequacy      Satisfactory for evaluation  endocervical/transformation zone component PRESENT.   Diagnosis      NEGATIVE FOR INTRAEPITHELIAL LESIONS OR MALIGNANCY.   Chlamydia Negative     Comment: Normal Reference Range - Negative   Neisseria gonorrhea Negative     Comment: Normal Reference Range - Negative   HPV NOT DETECTED     Comment: Normal Reference Range - NOT Detected   Material Submitted CervicoVaginal Pap [ThinPrep Imaged]      PHQ2/9: Depression  screen [facility] 2/9 05/12/2018 06/04/2016  Decreased Interest 0 0  Down, Depressed, Hopeless 0 0  PHQ - 2 Score 0 0  Altered sleeping 0 -  Tired, decreased energy 3 -  Change in appetite 0 -  Feeling bad or failure about yourself  0 -  Trouble concentrating 0 -  Moving slowly or fidgety/restless 0 -  Suicidal thoughts 0 -  PHQ-9 Score 3 -  Difficult doing work/chores Not difficult at all -     Fall Risk: Fall Risk  05/12/2018 06/04/2016  Falls in the past year? No  No     Functional Status Survey: Is the patient deaf or have difficulty hearing?: No Does the patient have difficulty seeing, even when wearing glasses/contacts?: No Does the patient have difficulty concentrating, remembering, or making decisions?: No Does the patient have difficulty walking or climbing stairs?: No Does the patient have difficulty dressing or bathing?: No Does the patient have difficulty doing errands alone such as visiting a doctor's office or shopping?: No    Assessment & Plan  1. Pre-diabetes  Eating better   2. Morbid obesity, unspecified obesity type [facility])  She still goes to bariatric center and is back on phentermine   3. S/P bariatric surgery  Avoid nsaid's   4. Other fatigue  Still feels tired, but works full time , has two children and boyfriend has one child same age as her oldest. Likely multifactorial   5. Vitamin D deficiency  Discussed replacing vitamin D   6. Migraine without aura and without status migrainosus, not intractable  - SUMAtriptan (IMITREX) 20 MG/ACT nasal spray; Place 1 spray (20 mg total) into the nose every 2 (two) hours as needed for migraine or headache. No more than 2 sprays in 24 hours  Dispense: 3 Inhaler; Refill: 0 - rizatriptan (MAXALT-MLT) 10 MG disintegrating tablet; Take 1 tablet (10 mg total) by mouth as needed for migraine. May repeat in 2 hours if needed  Dispense: 10 tablet; Refill: 0  7. Chronic daily headache  Needs to stop taking caffeine try nortriptyline, discussed possible side effects  - nortriptyline (PAMELOR) 10 MG capsule; Take 1 capsule (10 mg total) by mouth at bedtime.  Dispense: 30 capsule; Refill: 2

## 2018-06-11 ENCOUNTER — Ambulatory Visit: Payer: BLUE CROSS/BLUE SHIELD | Admitting: Podiatry

## 2018-06-17 ENCOUNTER — Ambulatory Visit: Payer: BLUE CROSS/BLUE SHIELD | Admitting: Podiatry

## 2018-06-17 ENCOUNTER — Encounter: Payer: Self-pay | Admitting: Podiatry

## 2018-06-17 ENCOUNTER — Ambulatory Visit (INDEPENDENT_AMBULATORY_CARE_PROVIDER_SITE_OTHER): Payer: BLUE CROSS/BLUE SHIELD

## 2018-06-17 DIAGNOSIS — M2011 Hallux valgus (acquired), right foot: Secondary | ICD-10-CM | POA: Diagnosis not present

## 2018-06-17 DIAGNOSIS — M129 Arthropathy, unspecified: Secondary | ICD-10-CM
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
## 2018-06-23 ENCOUNTER — Encounter: Payer: Self-pay | Admitting: Family Medicine

## 2018-06-24 ENCOUNTER — Other Ambulatory Visit: Payer: Self-pay

## 2018-06-24 DIAGNOSIS — M21611 Bunion of right foot: Secondary | ICD-10-CM

## 2018-07-09 ENCOUNTER — Encounter: Payer: Self-pay | Admitting: Family Medicine

## 2018-07-11 ENCOUNTER — Encounter: Payer: Self-pay | Admitting: Obstetrics and Gynecology

## 2018-07-14 ENCOUNTER — Ambulatory Visit: Payer: BLUE CROSS/BLUE SHIELD | Admitting: Podiatry

## 2018-08-03 ENCOUNTER — Ambulatory Visit (INDEPENDENT_AMBULATORY_CARE_PROVIDER_SITE_OTHER): Payer: BLUE CROSS/BLUE SHIELD | Admitting: Obstetrics and Gynecology

## 2018-08-03 ENCOUNTER — Encounter: Payer: Self-pay | Admitting: Obstetrics and Gynecology

## 2018-08-03 VITALS — BP 118/86 | HR 61 | Ht 64.0 in | Wt 232.0 lb

## 2018-08-03 DIAGNOSIS — B373 Candidiasis of vulva and vagina: Secondary | ICD-10-CM | POA: Diagnosis not present

## 2018-08-03 DIAGNOSIS — B3731 Acute candidiasis of vulva and vagina: Secondary | ICD-10-CM

## 2018-08-03 DIAGNOSIS — N3001 Acute cystitis with hematuria: Secondary | ICD-10-CM

## 2018-08-03 LAB — POCT URINALYSIS DIPSTICK
BILIRUBIN UA: NEGATIVE
GLUCOSE UA: NEGATIVE
Ketones, UA: NEGATIVE
Nitrite, UA: NEGATIVE
Protein, UA: NEGATIVE
Spec Grav, UA: 1.015 (ref 1.010–1.025)
pH, UA: 8 (ref 5.0–8.0)

## 2018-08-03 LAB — POCT WET PREP WITH KOH
Clue Cells Wet Prep HPF POC: NEGATIVE
KOH Prep POC: NEGATIVE
Trichomonas, UA: NEGATIVE
Yeast Wet Prep HPF POC: POSITIVE

## 2018-08-03 MED ORDER — TERCONAZOLE 0.4 % VA CREA: 1.0000 | TOPICAL_CREAM | Freq: Every day | VAGINAL | 0 refills | Status: DC

## 2018-08-03 MED ORDER — FLUCONAZOLE 150 MG PO TABS: 150.0000 mg | ORAL_TABLET | Freq: Once | ORAL | 0 refills | Status: DC

## 2018-08-03 MED ORDER — CIPROFLOXACIN HCL 500 MG PO TABS: 500.0000 mg | ORAL_TABLET | Freq: Two times a day (BID) | ORAL | 0 refills | Status: AC

## 2018-08-03 NOTE — Progress Notes (Signed)
Steele Sizer, MD   Chief Complaint  Patient presents with  . Urinary Tract Infection    UTI's in 2 months went to [facility] urgent care, las UTI came with a pos BV, frequency and burning sensation, blood when wipes, is having discharge and itchiness, sometimes odor    HPI:      [name] is a 32 y.o. [id number] who LMP was No LMP recorded. Patient has had an ablation., presents today for UTI sx of urinary frequency, dysuria and occas blood with wiping, although hard to tell if vaginal vs bladder. Sx started 2 days ago. Treated for UTI 9/19 at Urgent care with Macrobid and diflucan. C&S was neg. Pt had sx again 10/19 and saw urgent care again. Pt treated with Macrobid again, as well as flagyl for BV. C&S showed E. Coli, no resistance. Pt's urin sx have returned.  She also complains of increased vag d/c, itch, no odor recently. Pt's vagina is very painful and hurts to walk/sit. No LBP, belly pain, fevers. No new partners. Neg STD testing at urgent care. Has started probiotics.  Past Medical History:  Diagnosis Date  . B12 deficiency   . Family history of adverse reaction to anesthesia    MATERNAL AUNT-PT UNAWARE OF WHAT HAPPENED WITH ANESTHESIA BUT STATES HER AUNT HAS HAD PROBLEMS IN PAST   . Genital herpes   . Headache    MIGRAINE  . Hyperlipidemia   . Hypertension   . Migraine   . Obesity   . PONV (postoperative nausea and vomiting)   . Syncope 04-2015    Past Surgical History:  Procedure Laterality Date  . APPENDECTOMY    . CESAREAN SECTION     X2  . DIAGNOSTIC LAPAROSCOPY    . ENDOMETRIAL BIOPSY    . GASTRIC BYPASS    . LASIK    . MOUTH SURGERY    . NOVASURE ABLATION N/A 06/29/2015   Procedure: DILATION AND CURETTAGE, HYSTEROSCOPY, NOVASURE ABLATION;  Surgeon: Malachy Mood, MD;  Location: [facility] ORS;  Service: Gynecology;  Laterality: N/A;  . TUBAL LIGATION    . WISDOM TOOTH EXTRACTION      Family History  Problem Relation Age of Onset  . Obesity Mother     . Hyperlipidemia Mother   . Obesity Sister   . Obesity Brother   . Obesity Sister     Social History   Socioeconomic History  . Marital status: Significant Other    Spouse name: Not on file  . Number of children: 2  . Years of education: Not on file  . Highest education level: Some college, no degree  Occupational History  . Occupation: Multimedia programmer   Social Needs  . Financial resource strain: Patient refused  . Food insecurity:    Worry: Never true    Inability: Never true  . Transportation needs:    Medical: No    Non-medical: No  Tobacco Use  . Smoking status: Never Smoker  . Smokeless tobacco: Never Used  Substance and Sexual Activity  . Alcohol use: Yes    Comment: RARE  . Drug use: No  . Sexual activity: Yes    Partners: Male  Lifestyle  . Physical activity:    Days per week: 0 days    Minutes per session: 0 min  . Stress: Not at all  Relationships  . Social connections:    Talks on phone: More than three times a week    Gets together: More than  three times a week    Attends religious service: More than 4 times per year    Active member of club or organization: No    Attends meetings of clubs or organizations: Never    Relationship status: Not on file  . Intimate partner violence:    Fear of current or ex partner: No    Emotionally abused: No    Physically abused: No    Forced sexual activity: No  Other Topics Concern  . Not on file  Social History Narrative      She also goes to school full time at [employer]    Outpatient Medications Prior to Visit  Medication Sig Dispense Refill  . Multiple Vitamin (MULTIVITAMIN) capsule Take 2 capsules by mouth daily.    . phentermine (ADIPEX-P) 37.5 MG tablet Take 37.5 mg by mouth every morning.  2  . rizatriptan (MAXALT-MLT) 10 MG disintegrating tablet Take 1 tablet (10 mg total) by mouth as needed for migraine. May repeat in 2 hours if needed 10 tablet 0  . SUMAtriptan (IMITREX) 20 MG/ACT nasal spray  Place 1 spray (20 mg total) into the nose every 2 (two) hours as needed for migraine or headache. No more than 2 sprays in 24 hours 3 Inhaler 0  . topiramate (TOPAMAX) 100 MG tablet Take 100 mg by mouth 2 (two) times daily.    . Vitamin D, Ergocalciferol, (DRISDOL) 50000 units CAPS capsule Take 1 capsule (50,000 Units total) by mouth every 7 (seven) days. 12 capsule 0  . nortriptyline (PAMELOR) 10 MG capsule Take 1 capsule (10 mg total) by mouth at bedtime. 30 capsule 2  . topiramate (TOPAMAX) 50 MG tablet Take 50 mg by mouth 2 (two) times daily.  3   No facility-administered medications prior to visit.      ROS:  Review of Systems  Constitutional: Negative for fatigue, fever and unexpected weight change.  Respiratory: Negative for cough, shortness of breath and wheezing.   Cardiovascular: Negative for chest pain, palpitations and leg swelling.  Gastrointestinal: Negative for blood in stool, constipation, diarrhea, nausea and vomiting.  Endocrine: Negative for cold intolerance, heat intolerance and polyuria.  Genitourinary: Positive for dyspareunia, dysuria, frequency, vaginal bleeding and vaginal discharge. Negative for flank pain, genital sores, hematuria, menstrual problem, pelvic pain, urgency and vaginal pain.  Musculoskeletal: Negative for back pain, joint swelling and myalgias.  Skin: Negative for rash.  Neurological: Negative for dizziness, syncope, light-headedness, numbness and headaches.  Hematological: Negative for adenopathy.  Psychiatric/Behavioral: Negative for agitation, confusion, sleep disturbance and suicidal ideas. The patient is not nervous/anxious.     OBJECTIVE:   Vitals:  BP 118/86   Pulse 61   Ht 5\' 4"  (1.626 m)   Wt 232 lb (105.2 kg)   BMI 39.82 kg/m   Physical Exam  Constitutional: She is oriented to person, place, and time. Vital signs are normal. She appears well-developed.  Pulmonary/Chest: Effort normal.  Genitourinary: Uterus normal. There is  rash and tenderness on the right labia. There is no lesion on the right labia. There is rash and tenderness on the left labia. There is no lesion on the left labia. Uterus is not enlarged and not tender. Cervix exhibits no motion tenderness. Right adnexum displays no mass and no tenderness. Left adnexum displays no mass and no tenderness. There is erythema and tenderness in the vagina. Vaginal discharge found.  Genitourinary Comments: ERYTHEMATOUS, SWOLLEN, LABIA MAJORA/MINORA; NO LESIONS/ULCERS  Musculoskeletal: Normal range of motion.  Neurological: She is alert and  oriented to person, place, and time.  Psychiatric: She has a normal mood and affect. Her behavior is normal. Thought content normal.  Vitals reviewed.   Results: Results for orders placed or performed in visit on 08/03/18 (from the past 24 hour(s))  POCT Urinalysis Dipstick     Status: Abnormal   Collection Time: 08/03/18  2:52 PM  Result Value Ref Range   Color, UA STRAW    Clarity, UA cloudy    Glucose, UA Negative Negative   Bilirubin, UA neg    Ketones, UA neg    Spec Grav, UA 1.015 1.010 - 1.025   Blood, UA trace    pH, UA 8.0 5.0 - 8.0   Protein, UA Negative Negative   Urobilinogen, UA     Nitrite, UA neg    Leukocytes, UA Large (3+) (A) Negative   Appearance     Odor    POCT Wet Prep with KOH     Status: Abnormal   Collection Time: 08/03/18  2:53 PM  Result Value Ref Range   Trichomonas, UA Negative    Clue Cells Wet Prep HPF POC neg    Epithelial Wet Prep HPF POC     Yeast Wet Prep HPF POC POS    Bacteria Wet Prep HPF POC     RBC Wet Prep HPF POC     WBC Wet Prep HPF POC     KOH Prep POC Negative Negative     Assessment/Plan: Acute cystitis with hematuria - Pos UA. Check C&S. Will call with results. Rx cipro. Increase water/void frequently. - Plan: ciprofloxacin (CIPRO) 500 MG tablet, POCT Urinalysis Dipstick, Urine Culture  Candidal vaginitis - Pos exam/wet prep. Rx diflucan and terazol given  severity. Cold compresses/OTC hydrocortisone crm. Cont probiotics.  F/u prn.  - Plan: fluconazole (DIFLUCAN) 150 MG tablet, terconazole (TERAZOL 7) 0.4 % vaginal cream, POCT Wet Prep with KOH    Meds ordered this encounter  Medications  . fluconazole (DIFLUCAN) 150 MG tablet    Sig: Take 1 tablet (150 mg total) by mouth once for 1 dose.    Dispense:  1 tablet    Refill:  0    Order Specific Question:   Supervising Provider    Answer:   Gae Dry [id number]  . terconazole (TERAZOL 7) 0.4 % vaginal cream    Sig: Place 1 applicator vaginally at bedtime.    Dispense:  45 g    Refill:  0    Order Specific Question:   Supervising Provider    Answer:   Gae Dry [id number]  . ciprofloxacin (CIPRO) 500 MG tablet    Sig: Take 1 tablet (500 mg total) by mouth 2 (two) times daily for 5 days.    Dispense:  10 tablet    Refill:  0    Order Specific Question:   Supervising Provider    Answer:   Gae Dry [[id number]]      Return if symptoms worsen or fail to improve.  Shereka Lafortune B. Lareen Mullings, PA-C 08/03/2018 2:56 PM

## 2018-08-03 NOTE — Patient Instructions (Signed)
I value your feedback and entrusting us with your care. If you get a [facility] patient survey, I would appreciate you taking the time to let us know about your experience today. Thank you! 

## 2018-08-05 ENCOUNTER — Telehealth: Payer: Self-pay

## 2018-08-05 LAB — URINE CULTURE

## 2018-08-05 NOTE — Telephone Encounter (Signed)
Pt calling about her urine results, she sees them on my chart. [facility] was supposed to call her she may have to change her antibiotic? She states she has 3 different medications , does she need to take all of them? Does she need to not have intercourse? Please advise

## 2018-08-05 NOTE — Telephone Encounter (Signed)
Called pt, no answer, LVMTRC. [facility] called her earlier today late morning and left vm with urine results.

## 2018-08-05 NOTE — Telephone Encounter (Signed)
Pt aware.

## 2018-08-19 ENCOUNTER — Ambulatory Visit: Payer: BLUE CROSS/BLUE SHIELD | Admitting: Family Medicine

## 2018-09-16 ENCOUNTER — Encounter: Payer: Self-pay | Admitting: Obstetrics and Gynecology

## 2018-09-16 ENCOUNTER — Ambulatory Visit (INDEPENDENT_AMBULATORY_CARE_PROVIDER_SITE_OTHER): Payer: BLUE CROSS/BLUE SHIELD | Admitting: Obstetrics and Gynecology

## 2018-09-16 VITALS — BP 100/70 | HR 66 | Ht 64.0 in | Wt 224.0 lb

## 2018-09-16 DIAGNOSIS — R05 Cough: Secondary | ICD-10-CM

## 2018-09-16 DIAGNOSIS — N760 Acute vaginitis: Secondary | ICD-10-CM

## 2018-09-16 DIAGNOSIS — R059 Cough, unspecified: Secondary | ICD-10-CM

## 2018-09-16 LAB — POCT WET PREP WITH KOH
CLUE CELLS WET PREP PER HPF POC: NEGATIVE
KOH PREP POC: NEGATIVE
Trichomonas, UA: NEGATIVE
YEAST WET PREP PER HPF POC: NEGATIVE

## 2018-09-16 MED ORDER — HYDROCOD POLST-CPM POLST ER 10-8 MG/5ML PO SUER: 5.0000 mL | Freq: Two times a day (BID) | ORAL | 0 refills | Status: DC | PRN

## 2018-09-16 MED ORDER — CLOTRIMAZOLE-BETAMETHASONE 1-0.05 % EX CREA: TOPICAL_CREAM | CUTANEOUS | 0 refills | Status: DC

## 2018-09-16 NOTE — Progress Notes (Signed)
Steele Sizer, MD   Chief Complaint  Patient presents with  . Vaginitis    discharge and itchiness/irritation, no odor x 3 weeks, pt says it went away after treated on last visit but came back 2 weeks after    HPI:      Jean Pitts is a 32 y.o. [id number] who LMP was No LMP recorded. Patient has had an ablation., presents today for a creamy, yellow d/c, intermittently for a few wks, with vaginal irritation for sev days, no odor. Notes occas blood with wiping. Treated for Yeast vag after several abx 11/19 with difcluan and terazol with sx relief. No abx since. No urin sx, fevers, belly pain. No meds to treat. Has had ext dyspareunia due to irritation. Uses scented soap and dryer sheets. Not taking probiotics regularly now.  Pt also sick with URI for about 1 1/2 wks. Has prod and non-prod cough, sore throat, ear pain, nasal congestion, and body aches due to coughing. No face/teeth pain. Taking dayquil and nyquil and mucinex. Sx not improving/resolving.   Past Medical History:  Diagnosis Date  . B12 deficiency   . Family history of adverse reaction to anesthesia    MATERNAL AUNT-PT UNAWARE OF WHAT HAPPENED WITH ANESTHESIA BUT STATES HER AUNT HAS HAD PROBLEMS IN PAST   . Genital herpes   . Headache    MIGRAINE  . Hyperlipidemia   . Hypertension   . Migraine   . Obesity   . PONV (postoperative nausea and vomiting)   . Syncope 04-2015    Past Surgical History:  Procedure Laterality Date  . APPENDECTOMY    . CESAREAN SECTION     X2  . DIAGNOSTIC LAPAROSCOPY    . ENDOMETRIAL BIOPSY    . GASTRIC BYPASS    . LASIK    . MOUTH SURGERY    . NOVASURE ABLATION N/A 06/29/2015   Procedure: DILATION AND CURETTAGE, HYSTEROSCOPY, NOVASURE ABLATION;  Surgeon: Malachy Mood, MD;  Location: [facility] ORS;  Service: Gynecology;  Laterality: N/A;  . TUBAL LIGATION    . WISDOM TOOTH EXTRACTION      Family History  Problem Relation Age of Onset  . Obesity Mother   . Hyperlipidemia Mother    . Obesity Sister   . Obesity Brother   . Obesity Sister     Social History   Socioeconomic History  . Marital status: Significant Other    Spouse name: Not on file  . Number of children: 2  . Years of education: Not on file  . Highest education level: Some college, no degree  Occupational History  . Occupation: Multimedia programmer   Social Needs  . Financial resource strain: Patient refused  . Food insecurity:    Worry: Never true    Inability: Never true  . Transportation needs:    Medical: No    Non-medical: No  Tobacco Use  . Smoking status: Never Smoker  . Smokeless tobacco: Never Used  Substance and Sexual Activity  . Alcohol use: Yes    Comment: RARE  . Drug use: No  . Sexual activity: Yes    Partners: Male  Lifestyle  . Physical activity:    Days per week: 0 days    Minutes per session: 0 min  . Stress: Not at all  Relationships  . Social connections:    Talks on phone: More than three times a week    Gets together: More than three times a week    Attends  religious service: More than 4 times per year    Active member of club or organization: No    Attends meetings of clubs or organizations: Never    Relationship status: Not on file  . Intimate partner violence:    Fear of current or ex partner: No    Emotionally abused: No    Physically abused: No    Forced sexual activity: No  Other Topics Concern  . Not on file  Social History Narrative      She also goes to school full time at [employer]    Outpatient Medications Prior to Visit  Medication Sig Dispense Refill  . Multiple Vitamin (MULTIVITAMIN) capsule Take 2 capsules by mouth daily.    . Multiple Vitamins-Minerals (MULTIVITAMIN ADULT PO) Take by mouth.    . phentermine (ADIPEX-P) 37.5 MG tablet Take 37.5 mg by mouth every morning.  2  . rizatriptan (MAXALT-MLT) 10 MG disintegrating tablet Take 1 tablet (10 mg total) by mouth as needed for migraine. May repeat in 2 hours if needed 10 tablet 0    . SUMAtriptan (IMITREX) 20 MG/ACT nasal spray Place 1 spray (20 mg total) into the nose every 2 (two) hours as needed for migraine or headache. No more than 2 sprays in 24 hours 3 Inhaler 0  . terconazole (TERAZOL 7) 0.4 % vaginal cream Place 1 applicator vaginally at bedtime. 45 g 0  . topiramate (TOPAMAX) 100 MG tablet Take 100 mg by mouth 2 (two) times daily.    . Vitamin D, Ergocalciferol, (DRISDOL) 50000 units CAPS capsule Take 1 capsule (50,000 Units total) by mouth every 7 (seven) days. 12 capsule 0   No facility-administered medications prior to visit.       ROS:  Review of Systems  Constitutional: Positive for fatigue. Negative for fever and unexpected weight change.  HENT: Positive for congestion, ear pain, postnasal drip, rhinorrhea and sore throat. Negative for sinus pressure and sinus pain.   Respiratory: Positive for cough. Negative for shortness of breath and wheezing.   Cardiovascular: Negative for chest pain, palpitations and leg swelling.  Gastrointestinal: Negative for blood in stool, constipation, diarrhea, nausea and vomiting.  Endocrine: Negative for cold intolerance, heat intolerance and polyuria.  Genitourinary: Positive for dyspareunia and vaginal discharge. Negative for dysuria, flank pain, frequency, genital sores, hematuria, menstrual problem, pelvic pain, urgency, vaginal bleeding and vaginal pain.  Musculoskeletal: Negative for back pain, joint swelling and myalgias.  Skin: Negative for rash.  Neurological: Negative for dizziness, syncope, light-headedness, numbness and headaches.  Hematological: Negative for adenopathy.  Psychiatric/Behavioral: Negative for agitation, confusion, sleep disturbance and suicidal ideas. The patient is not nervous/anxious.     OBJECTIVE:   Vitals:  BP 100/70   Pulse 66   Ht 5\' 4"  (1.626 m)   Wt 224 lb (101.6 kg)   BMI 38.45 kg/m   Physical Exam Vitals signs reviewed.  Constitutional:      Appearance: She is  well-developed.  HENT:     Right Ear: Ear canal and external ear normal. A middle ear effusion is present.     Left Ear: Ear canal and external ear normal. A middle ear effusion is present.     Nose:     Right Sinus: No maxillary sinus tenderness or frontal sinus tenderness.     Left Sinus: No maxillary sinus tenderness or frontal sinus tenderness.     Mouth/Throat:     Mouth: Mucous membranes are moist.     Pharynx: No oropharyngeal exudate or  posterior oropharyngeal erythema.  Neck:     Musculoskeletal: Normal range of motion.     Thyroid: No thyromegaly.  Cardiovascular:     Rate and Rhythm: Normal rate and regular rhythm.  Pulmonary:     Effort: Pulmonary effort is normal. No respiratory distress.     Breath sounds: Normal breath sounds. No decreased air movement. No decreased breath sounds, wheezing, rhonchi or rales.  Genitourinary:    Pubic Area: No rash.      Labia:        Right: No rash, tenderness or lesion.        Left: No rash, tenderness or lesion.      Vagina: Normal. No vaginal discharge, erythema or tenderness.     Cervix: Normal.     Uterus: Normal. Not enlarged and not tender.      Adnexa: Right adnexa normal and left adnexa normal.       Right: No mass or tenderness.         Left: No mass or tenderness.    Musculoskeletal: Normal range of motion.  Lymphadenopathy:     Head:     Right side of head: No submandibular, tonsillar, preauricular or posterior auricular adenopathy.     Left side of head: No submandibular, tonsillar, preauricular or posterior auricular adenopathy.     Cervical: No cervical adenopathy.  Neurological:     Mental Status: She is alert and oriented to person, place, and time.  Psychiatric:        Behavior: Behavior normal.        Thought Content: Thought content normal.        Judgment: Judgment normal.     Results: Results for orders placed or performed in visit on 09/16/18 (from the past 24 hour(s))  POCT Wet Prep with KOH      Status: Normal   Collection Time: 09/16/18  4:49 PM  Result Value Ref Range   Trichomonas, UA Negative    Clue Cells Wet Prep HPF POC neg    Epithelial Wet Prep HPF POC     Yeast Wet Prep HPF POC neg    Bacteria Wet Prep HPF POC     RBC Wet Prep HPF POC     WBC Wet Prep HPF POC     KOH Prep POC Negative Negative     Assessment/Plan: Acute vaginitis - Neg exam/wet prep. Question chem vs fungal. Rx lotrisone crm. Line dry underwear/dove sens skin soap. Resume probiotics. F/u prn. - Plan: clotrimazole-betamethasone (LOTRISONE) cream, POCT Wet Prep with KOH  Cough - Lungs CTAB but coughing. Rx tussionex. Cont OTC meds/add flonase. F/u via phone in a few days if still bad for Rx abx.  - Plan: chlorpheniramine-HYDROcodone (TUSSIONEX PENNKINETIC ER) 10-8 MG/5ML SUER  Has codeine allergy due to GI sx, but has had tussionex in past without issues.   Meds ordered this encounter  Medications  . clotrimazole-betamethasone (LOTRISONE) cream    Sig: Apply externally BID prn sx up to 2 wks    Dispense:  15 g    Refill:  0    Order Specific Question:   Supervising Provider    Answer:   Gae Dry [id number]  . chlorpheniramine-HYDROcodone (TUSSIONEX PENNKINETIC ER) 10-8 MG/5ML SUER    Sig: Take 5 mLs by mouth every 12 (twelve) hours as needed for cough.    Dispense:  60 mL    Refill:  0    Order Specific Question:   Supervising Provider  AnswerMarland Kitchen   Gae Dry [[id number]]   Gave pt note to be out of work today and tomorrow. 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    Return if symptoms worsen or fail to improve.  Athene Schuhmacher B. Eryc Bodey, PA-C 09/16/2018 4:53 PM

## 2018-09-16 NOTE — Patient Instructions (Signed)
I value your feedback and entrusting us with your care. If you get a [facility] patient survey, I would appreciate you taking the time to let us know about your experience today. Thank you! 

## 2018-09-20 ENCOUNTER — Telehealth: Payer: Self-pay

## 2018-09-20 NOTE — Telephone Encounter (Signed)
Per [facility], called pt to follow up on cough/URI, no answer, LVMTRC.

## 2018-10-26 DIAGNOSIS — R12 Heartburn: Secondary | ICD-10-CM | POA: Insufficient documentation

## 2018-10-26 DIAGNOSIS — E785 Hyperlipidemia, unspecified: Secondary | ICD-10-CM | POA: Insufficient documentation

## 2018-10-26 HISTORY — DX: Heartburn: R12

## 2018-11-02 DIAGNOSIS — M21611 Bunion of right foot: Secondary | ICD-10-CM | POA: Insufficient documentation

## 2018-11-02 DIAGNOSIS — M79671 Pain in right foot: Secondary | ICD-10-CM | POA: Insufficient documentation

## 2018-12-31 ENCOUNTER — Encounter: Payer: Self-pay | Admitting: Obstetrics and Gynecology

## 2019-01-03 ENCOUNTER — Other Ambulatory Visit: Payer: Self-pay | Admitting: Obstetrics and Gynecology

## 2019-01-03 DIAGNOSIS — B3731 Acute candidiasis of vulva and vagina: Secondary | ICD-10-CM

## 2019-01-03 DIAGNOSIS — B373 Candidiasis of vulva and vagina: Secondary | ICD-10-CM

## 2019-01-03 MED ORDER — TERCONAZOLE 0.4 % VA CREA: 1.0000 | TOPICAL_CREAM | Freq: Every day | VAGINAL | 0 refills | Status: DC

## 2019-01-03 NOTE — Progress Notes (Signed)
Rx terazol for yeast vag after abx use.

## 2019-01-27 ENCOUNTER — Encounter: Payer: Self-pay | Admitting: Obstetrics and Gynecology

## 2019-01-27 ENCOUNTER — Other Ambulatory Visit: Payer: Self-pay | Admitting: Obstetrics and Gynecology

## 2019-01-27 DIAGNOSIS — B3731 Acute candidiasis of vulva and vagina: Secondary | ICD-10-CM

## 2019-01-27 DIAGNOSIS — N760 Acute vaginitis: Secondary | ICD-10-CM

## 2019-01-27 DIAGNOSIS — B373 Candidiasis of vulva and vagina: Secondary | ICD-10-CM

## 2019-01-27 MED ORDER — CLOTRIMAZOLE-BETAMETHASONE 1-0.05 % EX CREA: TOPICAL_CREAM | CUTANEOUS | 0 refills | Status: DC

## 2019-01-27 MED ORDER — FLUCONAZOLE 150 MG PO TABS: 150.0000 mg | ORAL_TABLET | Freq: Once | ORAL | 0 refills | Status: DC

## 2019-01-27 MED ORDER — FLUCONAZOLE 150 MG PO TABS: 150.0000 mg | ORAL_TABLET | Freq: Once | ORAL | 0 refills | Status: AC

## 2019-01-27 NOTE — Progress Notes (Signed)
Rx diflucan and lotrisone cream for yeast vag sx.

## 2019-02-15 ENCOUNTER — Other Ambulatory Visit (INDEPENDENT_AMBULATORY_CARE_PROVIDER_SITE_OTHER): Payer: BLUE CROSS/BLUE SHIELD

## 2019-02-15 ENCOUNTER — Ambulatory Visit (INDEPENDENT_AMBULATORY_CARE_PROVIDER_SITE_OTHER): Payer: BLUE CROSS/BLUE SHIELD | Admitting: Obstetrics and Gynecology

## 2019-02-15 ENCOUNTER — Encounter: Payer: Self-pay | Admitting: Obstetrics and Gynecology

## 2019-02-15 ENCOUNTER — Other Ambulatory Visit: Payer: Self-pay

## 2019-02-15 VITALS — BP 122/74 | Ht 64.0 in | Wt 228.0 lb

## 2019-02-15 DIAGNOSIS — B373 Candidiasis of vulva and vagina: Secondary | ICD-10-CM | POA: Diagnosis not present

## 2019-02-15 DIAGNOSIS — D251 Intramural leiomyoma of uterus: Secondary | ICD-10-CM

## 2019-02-15 DIAGNOSIS — R102 Pelvic and perineal pain: Secondary | ICD-10-CM

## 2019-02-15 DIAGNOSIS — B3731 Acute candidiasis of vulva and vagina: Secondary | ICD-10-CM

## 2019-02-15 DIAGNOSIS — N941 Unspecified dyspareunia: Secondary | ICD-10-CM

## 2019-02-15 MED ORDER — FLUCONAZOLE 150 MG PO TABS: 150.0000 mg | ORAL_TABLET | Freq: Once | ORAL | 0 refills | Status: DC

## 2019-02-15 MED ORDER — TERCONAZOLE 0.4 % VA CREA: 1.0000 | TOPICAL_CREAM | Freq: Every day | VAGINAL | 0 refills | Status: DC

## 2019-02-15 NOTE — Progress Notes (Signed)
Steele Sizer, MD   Chief Complaint  Patient presents with  . Pelvic Pain    HPI:      [name] is a 33 y.o. [id number] who LMP was No LMP recorded. Patient has had an ablation., presents today for pelvic pain sx and recurrent yeast vaginitis. Pt has noticed pelvic discomfort after sex for the past 8 months or so. Area feels sore and lasts a few days before resolving.  Pt is s/p gastric bypass and denies any GI sx. Hx of UTIs in past, last treated for GBS on C&S 11/19. No recent sx. She also keeps getting recurrent yeast infections since 11/19, triggered several times by abx use. Pt treated with diflucan, lotrisone crm and terazol with temporary relief. Sx recur, sometimes triggered by sex. Has vaginal itching/d/c, no odor, starting today. Is exercising regularly (showers immediately after) and has changed diet without sx change. Pt uses dove sens skin soap, not using dryer sheets, takes probiotics. Partner felt irritated recently and treated ext for yeast with sx improvement. Pt concerned about passing yeast back and forth.  Pt is s/p ablation and TL. Occas has small amt of spotting. She is sex active, no new partners.    Past Medical History:  Diagnosis Date  . B12 deficiency   . Family history of adverse reaction to anesthesia    MATERNAL AUNT-PT UNAWARE OF WHAT HAPPENED WITH ANESTHESIA BUT STATES HER AUNT HAS HAD PROBLEMS IN PAST   . Genital herpes   . Headache    MIGRAINE  . Hyperlipidemia   . Hypertension   . Migraine   . Obesity   . PONV (postoperative nausea and vomiting)   . Syncope 04-2015    Past Surgical History:  Procedure Laterality Date  . APPENDECTOMY    . BUNIONECTOMY    . CESAREAN SECTION     X2  . DIAGNOSTIC LAPAROSCOPY    . ENDOMETRIAL BIOPSY    . GASTRIC BYPASS    . LASIK    . MOUTH SURGERY    . NOVASURE ABLATION N/A 06/29/2015   Procedure: DILATION AND CURETTAGE, HYSTEROSCOPY, NOVASURE ABLATION;  Surgeon: Malachy Mood, MD;  Location:  [facility];  Service: Gynecology;  Laterality: N/A;  . TUBAL LIGATION    . WISDOM TOOTH EXTRACTION      Family History  Problem Relation Age of Onset  . Obesity Mother   . Hyperlipidemia Mother   . Obesity Sister   . Obesity Brother   . Obesity Sister     Social History   Socioeconomic History  . Marital status: Significant Other    Spouse name: Not on file  . Number of children: 2  . Years of education: Not on file  . Highest education level: Some college, no degree  Occupational History  . Occupation: Multimedia programmer   Social Needs  . Financial resource strain: Patient refused  . Food insecurity:    Worry: Never true    Inability: Never true  . Transportation needs:    Medical: No    Non-medical: No  Tobacco Use  . Smoking status: Never Smoker  . Smokeless tobacco: Never Used  Substance and Sexual Activity  . Alcohol use: Yes    Comment: RARE  . Drug use: No  . Sexual activity: Yes    Partners: Male    Birth control/protection: Surgical  Lifestyle  . Physical activity:    Days per week: 0 days    Minutes per session: 0 min  .  Stress: Not at all  Relationships  . Social connections:    Talks on phone: More than three times a week    Gets together: More than three times a week    Attends religious service: More than 4 times per year    Active member of club or organization: No    Attends meetings of clubs or organizations: Never    Relationship status: Not on file  . Intimate partner violence:    Fear of current or ex partner: No    Emotionally abused: No    Physically abused: No    Forced sexual activity: No  Other Topics Concern  . Not on file  Social History Narrative      She also goes to school full time at [employer]    Outpatient Medications Prior to Visit  Medication Sig Dispense Refill  . Multiple Vitamin (MULTIVITAMIN) capsule Take 2 capsules by mouth daily.    . chlorpheniramine-HYDROcodone (TUSSIONEX PENNKINETIC ER) 10-8 MG/5ML SUER  Take 5 mLs by mouth every 12 (twelve) hours as needed for cough. (Patient not taking: Reported on 02/15/2019) 60 mL 0  . clotrimazole-betamethasone (LOTRISONE) cream Apply externally BID prn sx up to 2 wks (Patient not taking: Reported on 02/15/2019) 45 g 0  . Multiple Vitamins-Minerals (MULTIVITAMIN ADULT PO) Take by mouth.    . phentermine (ADIPEX-P) 37.5 MG tablet Take 37.5 mg by mouth every morning.  2  . rizatriptan (MAXALT-MLT) 10 MG disintegrating tablet Take 1 tablet (10 mg total) by mouth as needed for migraine. May repeat in 2 hours if needed (Patient not taking: Reported on 02/15/2019) 10 tablet 0  . SUMAtriptan (IMITREX) 20 MG/ACT nasal spray Place 1 spray (20 mg total) into the nose every 2 (two) hours as needed for migraine or headache. No more than 2 sprays in 24 hours (Patient not taking: Reported on 02/15/2019) 3 Inhaler 0  . topiramate (TOPAMAX) 100 MG tablet Take 100 mg by mouth 2 (two) times daily.    . Vitamin D, Ergocalciferol, (DRISDOL) 50000 units CAPS capsule Take 1 capsule (50,000 Units total) by mouth every 7 (seven) days. (Patient not taking: Reported on 02/15/2019) 12 capsule 0  . terconazole (TERAZOL 7) 0.4 % vaginal cream Place 1 applicator vaginally at bedtime. (Patient not taking: Reported on 02/15/2019) 45 g 0   No facility-administered medications prior to visit.       ROS:  Review of Systems  Constitutional: Negative for fatigue, fever and unexpected weight change.  Respiratory: Negative for cough, shortness of breath and wheezing.   Cardiovascular: Negative for chest pain, palpitations and leg swelling.  Gastrointestinal: Negative for blood in stool, constipation, diarrhea, nausea and vomiting.  Endocrine: Negative for cold intolerance, heat intolerance and polyuria.  Genitourinary: Positive for dyspareunia, pelvic pain, vaginal discharge and vaginal pain. Negative for dysuria, flank pain, frequency, genital sores, hematuria, menstrual problem, urgency and  vaginal bleeding.  Musculoskeletal: Negative for back pain, joint swelling and myalgias.  Skin: Negative for rash.  Neurological: Negative for dizziness, syncope, light-headedness, numbness and headaches.  Hematological: Negative for adenopathy.  Psychiatric/Behavioral: Negative for agitation, confusion, sleep disturbance and suicidal ideas. The patient is not nervous/anxious.     OBJECTIVE:   Vitals:  BP 122/74   Ht 5\' 4"  (1.626 m)   Wt 228 lb (103.4 kg)   BMI 39.14 kg/m   Physical Exam Vitals signs reviewed.  Constitutional:      Appearance: She is well-developed.  Neck:     Musculoskeletal: Normal range  of motion.  Pulmonary:     Effort: Pulmonary effort is normal.  Genitourinary:    Pubic Area: No rash.      Labia:        Right: Rash and tenderness present. No lesion.        Left: Rash and tenderness present. No lesion.      Vagina: Vaginal discharge present. No erythema or tenderness.     Cervix: Normal.     Uterus: Normal. Not enlarged and not tender.      Adnexa: Right adnexa normal and left adnexa normal.       Right: No mass or tenderness.         Left: No mass or tenderness.       Comments: BILAT LABIA MAJORA AND MINORA WITH ERYTHEMA, SWELLING, IRRITATION; NO D/C; NO LESIONS Musculoskeletal: Normal range of motion.  Skin:    General: Skin is warm and dry.  Neurological:     General: No focal deficit present.     Mental Status: She is alert and oriented to person, place, and time.  Psychiatric:        Mood and Affect: Mood normal.        Behavior: Behavior normal.        Thought Content: Thought content normal.        Judgment: Judgment normal.    Results:  ULTRASOUND REPORT  Location: [facility]  Date of Service: 02/15/2019   Indications:Pelvic Pain Findings:  The uterus is anteverted and measures 9.1 x 5.4 x 3.6cm. Echo texture is homogenous with evidence of focal masses. Within the uterus are multiple suspected fibroids measuring:  Fibroid 1:  1.0 x 0.9 x 1.0cm (RT/high, IM) Fibroid 2:  1.2 x 1.1 x 1.0cm (RT/high, IM)  The Endometrium measures 5.3 mm.  Right Ovary measures 3.0 x 1.8 x 1.6 cm. It is normal in appearance. Left Ovary measures 2.7 x 1.9 x 1.3 cm. It is normal in appearance. Survey of the adnexa demonstrates no adnexal masses. There is no free fluid in the cul de sac.  Impression: 1. Two small intramural fibroids, otherwise normal gyn ultrasound.  Recommendations: 1.Clinical correlation with the patient's History and Physical Exam.  Vita Barley, RT  Assessment/Plan: Pelvic pain - Neg GYN u/s except 2 small leio (not cause of sx). Check for UTI given UA results. Will call wiht results. May be cause of sx. - Plan: US PELVIS TRANSVANGINAL NON-OB (TV ONLY), POCT Urinalysis Dipstick, Urine Culture  Dyspareunia in female - Neg GYN u/s.  Candidal vaginitis - Pos exam/wet prep. Rx diflucan and terazol given severity. Cont probiotics. Check One Swab culture. May need maint tx. Will f/u with results. - Plan: POCT Wet Prep with KOH, Other/Misc lab test, fluconazole (DIFLUCAN) 150 MG tablet, terconazole (TERAZOL 7) 0.4 % vaginal cream, DISCONTINUED: fluconazole (DIFLUCAN) 150 MG tablet, DISCONTINUED: terconazole (TERAZOL 7) 0.4 % vaginal cream    Meds ordered this encounter  Medications  . DISCONTD: fluconazole (DIFLUCAN) 150 MG tablet    Sig: Take 1 tablet (150 mg total) by mouth once for 1 dose. Repeat after 3 days    Dispense:  2 tablet    Refill:  0    Order Specific Question:   Supervising Provider    Answer:   Gae Dry [[id number]]  . DISCONTD: terconazole (TERAZOL 7) 0.4 % vaginal cream    Sig: Place 1 applicator vaginally at bedtime.    Dispense:  45 g    Refill:  0    Order Specific Question:   Supervising Provider    Answer:   Gae Dry [[id number]]  . fluconazole (DIFLUCAN) 150 MG tablet    Sig: Take 1 tablet (150 mg total) by mouth once for 1 dose. Repeat after 3 days     Dispense:  2 tablet    Refill:  0    Order Specific Question:   Supervising Provider    Answer:   Gae Dry [[id number]]  . terconazole (TERAZOL 7) 0.4 % vaginal cream    Sig: Place 1 applicator vaginally at bedtime.    Dispense:  45 g    Refill:  0    Order Specific Question:   Supervising Provider    Answer:   Gae Dry [[id number]]      Return if symptoms worsen or fail to improve.  Alicia B. Copland, PA-C 02/16/2019 11:52 AM

## 2019-02-16 ENCOUNTER — Encounter: Payer: Self-pay | Admitting: Obstetrics and Gynecology

## 2019-02-16 LAB — POCT URINALYSIS DIPSTICK
Bilirubin, UA: NEGATIVE
Blood, UA: NEGATIVE
Glucose, UA: NEGATIVE
Ketones, UA: NEGATIVE
Nitrite, UA: NEGATIVE
Protein, UA: NEGATIVE
Spec Grav, UA: 1.025 (ref 1.010–1.025)
pH, UA: 6 (ref 5.0–8.0)

## 2019-02-16 LAB — POCT WET PREP WITH KOH
Clue Cells Wet Prep HPF POC: NEGATIVE
KOH Prep POC: NEGATIVE
Trichomonas, UA: NEGATIVE
Yeast Wet Prep HPF POC: POSITIVE

## 2019-02-16 NOTE — Patient Instructions (Signed)
I value your feedback and entrusting us with your care. If you get a [facility] patient survey, I would appreciate you taking the time to let us know about your experience today. Thank you! 

## 2019-02-17 LAB — URINE CULTURE

## 2019-02-17 NOTE — Progress Notes (Signed)
Pls let pt know C&S neg. Thx

## 2019-02-17 NOTE — Progress Notes (Signed)
Pt aware.

## 2019-02-23 ENCOUNTER — Encounter: Payer: Self-pay | Admitting: Obstetrics and Gynecology

## 2019-02-23 ENCOUNTER — Other Ambulatory Visit: Payer: Self-pay | Admitting: Obstetrics and Gynecology

## 2019-02-23 DIAGNOSIS — B373 Candidiasis of vulva and vagina: Secondary | ICD-10-CM | POA: Insufficient documentation

## 2019-02-23 DIAGNOSIS — B3731 Acute candidiasis of vulva and vagina: Secondary | ICD-10-CM

## 2019-02-23 HISTORY — DX: Candidiasis of vulva and vagina: B37.3

## 2019-02-23 HISTORY — DX: Acute candidiasis of vulva and vagina: B37.31

## 2019-02-23 MED ORDER — FLUCONAZOLE 150 MG PO TABS: ORAL_TABLET | ORAL | 0 refills | Status: DC

## 2019-02-23 MED ORDER — MOXIFLOXACIN HCL 400 MG PO TABS: 400.0000 mg | ORAL_TABLET | Freq: Every day | ORAL | 0 refills | Status: AC

## 2019-02-23 NOTE — Patient Instructions (Signed)
I value your feedback and entrusting us with your care. If you get a [facility] patient survey, I would appreciate you taking the time to let us know about your experience today. Thank you! 

## 2019-02-23 NOTE — Progress Notes (Signed)
Pt with Candida albicans and GBS (natural bacteria) on MDL One Swab culture. Pt with recurrent yeast vaginitis sx. Rx amp 500 mg TID for 7 days for GBS and Rx diflucan 150 mg Q3 days for 4 wks for recurrent yeast vag. Cont probiotics.

## 2019-02-23 NOTE — Progress Notes (Signed)
Pt aware.

## 2019-06-29 ENCOUNTER — Ambulatory Visit: Payer: Self-pay | Admitting: Family Medicine

## 2019-08-02 ENCOUNTER — Encounter: Payer: Self-pay | Admitting: Obstetrics and Gynecology

## 2019-08-02 ENCOUNTER — Ambulatory Visit (INDEPENDENT_AMBULATORY_CARE_PROVIDER_SITE_OTHER): Payer: BLUE CROSS/BLUE SHIELD | Admitting: Obstetrics and Gynecology

## 2019-08-02 ENCOUNTER — Other Ambulatory Visit: Payer: Self-pay

## 2019-08-02 VITALS — BP 120/80 | Ht 64.0 in | Wt 225.0 lb

## 2019-08-02 DIAGNOSIS — B3731 Acute candidiasis of vulva and vagina: Secondary | ICD-10-CM

## 2019-08-02 DIAGNOSIS — B373 Candidiasis of vulva and vagina: Secondary | ICD-10-CM | POA: Diagnosis not present

## 2019-08-02 LAB — POCT WET PREP WITH KOH
Clue Cells Wet Prep HPF POC: NEGATIVE
KOH Prep POC: NEGATIVE
Trichomonas, UA: NEGATIVE
Yeast Wet Prep HPF POC: POSITIVE

## 2019-08-02 MED ORDER — TERCONAZOLE 0.4 % VA CREA: 1.0000 | TOPICAL_CREAM | Freq: Every day | VAGINAL | 2 refills | Status: AC

## 2019-08-02 MED ORDER — FLUCONAZOLE 150 MG PO TABS: 150.0000 mg | ORAL_TABLET | Freq: Once | ORAL | 0 refills | Status: AC

## 2019-08-02 NOTE — Patient Instructions (Signed)
I value your feedback and entrusting us with your care. If you get a [facility] patient survey, I would appreciate you taking the time to let us know about your experience today. Thank you! 

## 2019-08-02 NOTE — Progress Notes (Signed)
Steele Sizer, MD   Chief Complaint  Patient presents with  . Vaginal Discharge    itchiness and irritation, no odor x 1 week    HPI:      [name] is a 33 y.o. [id number] who LMP was No LMP recorded. Patient has had an ablation., presents today for vaginal d/c with irritation, no fishy odor, for 1 wk. Hx of recurrent yeast vag for the past yr. We have treated several times with diflucan, terazol and lotrisone crm. Last treated with diflucan about a month ago. Sex seems to be trigger, whether using condoms or not.  Pt was taking probiotics but stopped for awhile. Restarted them recently. Pt uses dove sens skin soap, no dryer sheets, scented wipes, or bath bombs. Is wearing cotton thongs daily.  Normal DM screening 8/19. Pt is s/p ablation and TL. Occas has small amt of spotting. She is sex active, no new partners.  Patient Active Problem List   Diagnosis Date Noted  . Yeast vaginitis 02/23/2019  . Hair thinning 05/12/2018  . Morbid obesity ([place]) 06/04/2016  . PCOS (polycystic ovarian syndrome) 06/04/2016  . History of hypertension 06/04/2016  . History of hyperlipidemia 06/04/2016  . S/P bariatric surgery 05/25/2013  . Vitamin B12 deficiency anemia 10/22/2012  . H/O cesarean section complicating pregnancy [id number]  . Migraine without aura and without status migrainosus, not intractable 12/04/2011    Past Surgical History:  Procedure Laterality Date  . APPENDECTOMY    . BUNIONECTOMY    . CESAREAN SECTION     X2  . DIAGNOSTIC LAPAROSCOPY    . ENDOMETRIAL BIOPSY    . GASTRIC BYPASS    . LASIK    . MOUTH SURGERY    . NOVASURE ABLATION N/A 06/29/2015   Procedure: DILATION AND CURETTAGE, HYSTEROSCOPY, NOVASURE ABLATION;  Surgeon: Malachy Mood, MD;  Location: [facility];  Service: Gynecology;  Laterality: N/A;  . TUBAL LIGATION    . WISDOM TOOTH EXTRACTION      Family History  Problem Relation Age of Onset  . Obesity Mother   . Hyperlipidemia Mother   .  Obesity Sister   . Obesity Brother   . Obesity Sister     Social History   Socioeconomic History  . Marital status: Significant Other    Spouse name: Not on file  . Number of children: 2  . Years of education: Not on file  . Highest education level: Some college, no degree  Occupational History  . Occupation: Multimedia programmer   Social Needs  . Financial resource strain: Patient refused  . Food insecurity    Worry: Never true    Inability: Never true  . Transportation needs    Medical: No    Non-medical: No  Tobacco Use  . Smoking status: Never Smoker  . Smokeless tobacco: Never Used  Substance and Sexual Activity  . Alcohol use: Yes    Comment: RARE  . Drug use: No  . Sexual activity: Yes    Partners: Male    Birth control/protection: Surgical    Comment: Ablation  Lifestyle  . Physical activity    Days per week: 0 days    Minutes per session: 0 min  . Stress: Not at all  Relationships  . Social connections    Talks on phone: More than three times a week    Gets together: More than three times a week    Attends religious service: More than 4 times per year  Active member of club or organization: No    Attends meetings of clubs or organizations: Never    Relationship status: Not on file  . Intimate partner violence    Fear of current or ex partner: No    Emotionally abused: No    Physically abused: No    Forced sexual activity: No  Other Topics Concern  . Not on file  Social History Narrative      She also goes to school full time at [employer] Medications Prior to Visit  Medication Sig Dispense Refill  . phentermine (ADIPEX-P) 37.5 MG tablet Take 37.5 mg by mouth every morning.  2  . chlorpheniramine-HYDROcodone (TUSSIONEX PENNKINETIC ER) 10-8 MG/5ML SUER Take 5 mLs by mouth every 12 (twelve) hours as needed for cough. (Patient not taking: Reported on 02/15/2019) 60 mL 0  . clotrimazole-betamethasone (LOTRISONE) cream Apply externally BID  prn sx up to 2 wks (Patient not taking: Reported on 02/15/2019) 45 g 0  . fluconazole (DIFLUCAN) 150 MG tablet Take 1 tab Q3 days for 4 wks for recurrent yeast vaginitis sx 9 tablet 0  . Multiple Vitamin (MULTIVITAMIN) capsule Take 2 capsules by mouth daily.    . Multiple Vitamins-Minerals (MULTIVITAMIN ADULT PO) Take by mouth.    . rizatriptan (MAXALT-MLT) 10 MG disintegrating tablet Take 1 tablet (10 mg total) by mouth as needed for migraine. May repeat in 2 hours if needed (Patient not taking: Reported on 02/15/2019) 10 tablet 0  . SUMAtriptan (IMITREX) 20 MG/ACT nasal spray Place 1 spray (20 mg total) into the nose every 2 (two) hours as needed for migraine or headache. No more than 2 sprays in 24 hours (Patient not taking: Reported on 02/15/2019) 3 Inhaler 0  . terconazole (TERAZOL 7) 0.4 % vaginal cream Place 1 applicator vaginally at bedtime. 45 g 0  . topiramate (TOPAMAX) 100 MG tablet Take 100 mg by mouth 2 (two) times daily.    . Vitamin D, Ergocalciferol, (DRISDOL) 50000 units CAPS capsule Take 1 capsule (50,000 Units total) by mouth every 7 (seven) days. (Patient not taking: Reported on 02/15/2019) 12 capsule 0   No facility-administered medications prior to visit.       ROS:  Review of Systems  Constitutional: Negative for fever.  Gastrointestinal: Negative for blood in stool, constipation, diarrhea, nausea and vomiting.  Genitourinary: Positive for vaginal discharge and vaginal pain. Negative for dyspareunia, dysuria, flank pain, frequency, hematuria, urgency and vaginal bleeding.  Musculoskeletal: Negative for back pain.  Skin: Negative for rash.  BREAST: No symptoms   OBJECTIVE:   Vitals:  BP 120/80   Ht 5\' 4"  (1.626 m)   Wt 225 lb (102.1 kg)   BMI 38.62 kg/m   Physical Exam Vitals signs reviewed.  Constitutional:      Appearance: She is well-developed.  Neck:     Musculoskeletal: Normal range of motion.  Pulmonary:     Effort: Pulmonary effort is normal.   Genitourinary:    Pubic Area: No rash.      Labia:        Right: No rash, tenderness or lesion.        Left: No rash, tenderness or lesion.      Vagina: Normal. No vaginal discharge, erythema or tenderness.     Cervix: Normal.     Uterus: Normal. Not enlarged and not tender.      Adnexa: Right adnexa normal and left adnexa normal.       Right: No mass  or tenderness.         Left: No mass or tenderness.       Comments: BILAT LABIA MAJORA AND MINORA WITH ERYTHEMA, SWELLING; UP TO MONS AREA Musculoskeletal: Normal range of motion.  Skin:    General: Skin is warm and dry.  Neurological:     General: No focal deficit present.     Mental Status: She is alert and oriented to person, place, and time.  Psychiatric:        Mood and Affect: Mood normal.        Behavior: Behavior normal.        Thought Content: Thought content normal.        Judgment: Judgment normal.     Results: Results for orders placed or performed in visit on 08/02/19 (from the past 24 hour(s))  POCT Wet Prep with KOH     Status: Abnormal   Collection Time: 08/02/19  1:47 PM  Result Value Ref Range   Trichomonas, UA Negative    Clue Cells Wet Prep HPF POC neg    Epithelial Wet Prep HPF POC     Yeast Wet Prep HPF POC POS    Bacteria Wet Prep HPF POC     RBC Wet Prep HPF POC     WBC Wet Prep HPF POC     KOH Prep POC Negative Negative     Assessment/Plan: Candidal vaginitis - Plan: terconazole (TERAZOL 7) 0.4 % vaginal cream, fluconazole (DIFLUCAN) 150 MG tablet, POCT Wet Prep with KOH; REcurrent. Pos sx, exam and wet prep. Rx terazol and diflucan given severity of sx. Then start terazol once wkly as maintenance/prevention. Cont probiotics, no thong underwear. F/u prn.   Meds ordered this encounter  Medications  . terconazole (TERAZOL 7) 0.4 % vaginal cream    Sig: Place 1 applicator vaginally at bedtime for 7 days. Then once weekly as maintenance for 3 months    Dispense:  45 g    Refill:  2    Order  Specific Question:   Supervising Provider    Answer:   Gae Dry [id number]  . fluconazole (DIFLUCAN) 150 MG tablet    Sig: Take 1 tablet (150 mg total) by mouth once for 1 dose.    Dispense:  1 tablet    Refill:  0    Order Specific Question:   Supervising Provider    Answer:   Gae Dry [id number]      Return in about 4 weeks (around 99991111) for annual.   B. , PA-C 08/02/2019 1:48 PM

## 2019-09-13 ENCOUNTER — Ambulatory Visit: Payer: BLUE CROSS/BLUE SHIELD | Admitting: Obstetrics and Gynecology

## 2019-10-05 ENCOUNTER — Ambulatory Visit: Payer: BLUE CROSS/BLUE SHIELD | Attending: Internal Medicine

## 2019-12-29 ENCOUNTER — Encounter: Payer: Self-pay | Admitting: Obstetrics and Gynecology

## 2019-12-29 ENCOUNTER — Other Ambulatory Visit: Payer: Self-pay | Admitting: Obstetrics and Gynecology

## 2019-12-29 DIAGNOSIS — B373 Candidiasis of vulva and vagina: Secondary | ICD-10-CM

## 2019-12-29 DIAGNOSIS — B3731 Acute candidiasis of vulva and vagina: Secondary | ICD-10-CM

## 2019-12-29 MED ORDER — FLUCONAZOLE 150 MG PO TABS: 150.0000 mg | ORAL_TABLET | Freq: Once | ORAL | 1 refills | Status: DC

## 2019-12-29 NOTE — Progress Notes (Signed)
Rx RF diflucan for yeast vag sx 

## 2020-02-08 ENCOUNTER — Other Ambulatory Visit: Payer: Self-pay | Admitting: Obstetrics and Gynecology

## 2020-02-08 ENCOUNTER — Encounter: Payer: Self-pay | Admitting: Obstetrics and Gynecology

## 2020-02-08 DIAGNOSIS — B3731 Acute candidiasis of vulva and vagina: Secondary | ICD-10-CM

## 2020-02-08 MED ORDER — FLUCONAZOLE 150 MG PO TABS: 150.0000 mg | ORAL_TABLET | Freq: Once | ORAL | 1 refills | Status: DC

## 2020-02-08 NOTE — Progress Notes (Signed)
Rx RF diflucan for yeast vag °

## 2020-03-28 ENCOUNTER — Other Ambulatory Visit: Payer: Self-pay | Admitting: Obstetrics and Gynecology

## 2020-03-28 DIAGNOSIS — B3731 Acute candidiasis of vulva and vagina: Secondary | ICD-10-CM

## 2020-04-24 ENCOUNTER — Other Ambulatory Visit: Payer: Self-pay | Admitting: Obstetrics and Gynecology

## 2020-04-24 DIAGNOSIS — B3731 Acute candidiasis of vulva and vagina: Secondary | ICD-10-CM

## 2020-05-03 ENCOUNTER — Telehealth: Payer: Self-pay

## 2020-05-03 ENCOUNTER — Other Ambulatory Visit: Payer: Self-pay | Admitting: Obstetrics and Gynecology

## 2020-05-03 DIAGNOSIS — B3731 Acute candidiasis of vulva and vagina: Secondary | ICD-10-CM

## 2020-05-03 MED ORDER — FLUCONAZOLE 150 MG PO TABS: 150.0000 mg | ORAL_TABLET | Freq: Once | ORAL | 0 refills | Status: AC

## 2020-05-03 NOTE — Telephone Encounter (Signed)
Pt aware.

## 2020-05-03 NOTE — Telephone Encounter (Signed)
Called pt, no answer, LVMTRC. 

## 2020-05-03 NOTE — Progress Notes (Signed)
Rx RF diflucan for yeast vag sx 

## 2020-05-03 NOTE — Telephone Encounter (Signed)
Pt calling; is experiencing a lot of vaginal irritation - red, inflammed, itching; no availability until Mon. Pharm sent request but it was denied.  Can something be called please so she doesn't have to wait all weekend?  If before 5 send rx to [facility] in [place]; if after 5 [facility] at [employer].  [phone] ok to leave vm

## 2020-05-03 NOTE — Telephone Encounter (Signed)
Rx diflucan eRxd. No need for Mon appt if sx resolve. F/u prn.

## 2020-05-30 ENCOUNTER — Ambulatory Visit: Payer: BLUE CROSS/BLUE SHIELD | Admitting: Obstetrics and Gynecology

## 2020-06-15 ENCOUNTER — Other Ambulatory Visit: Payer: Self-pay | Admitting: Obstetrics and Gynecology

## 2020-06-15 ENCOUNTER — Encounter: Payer: Self-pay | Admitting: Obstetrics and Gynecology

## 2020-06-15 DIAGNOSIS — B3731 Acute candidiasis of vulva and vagina: Secondary | ICD-10-CM

## 2020-06-15 MED ORDER — FLUCONAZOLE 150 MG PO TABS: 150.0000 mg | ORAL_TABLET | Freq: Once | ORAL | 1 refills | Status: AC

## 2020-06-15 NOTE — Progress Notes (Signed)
Rx RF diflucan for yeast vag

## 2020-06-25 ENCOUNTER — Encounter: Payer: Self-pay | Admitting: Obstetrics and Gynecology

## 2020-06-25 ENCOUNTER — Other Ambulatory Visit: Payer: Self-pay

## 2020-06-25 ENCOUNTER — Ambulatory Visit (INDEPENDENT_AMBULATORY_CARE_PROVIDER_SITE_OTHER): Payer: BLUE CROSS/BLUE SHIELD | Admitting: Obstetrics and Gynecology

## 2020-06-25 VITALS — BP 110/80 | Ht 63.0 in | Wt 226.0 lb

## 2020-06-25 DIAGNOSIS — Z0000 Encounter for general adult medical examination without abnormal findings: Secondary | ICD-10-CM | POA: Diagnosis not present

## 2020-06-25 DIAGNOSIS — B373 Candidiasis of vulva and vagina: Secondary | ICD-10-CM

## 2020-06-25 DIAGNOSIS — Z1329 Encounter for screening for other suspected endocrine disorder: Secondary | ICD-10-CM

## 2020-06-25 DIAGNOSIS — B3731 Acute candidiasis of vulva and vagina: Secondary | ICD-10-CM

## 2020-06-25 DIAGNOSIS — Z01419 Encounter for gynecological examination (general) (routine) without abnormal findings: Secondary | ICD-10-CM

## 2020-06-25 DIAGNOSIS — Z9884 Bariatric surgery status: Secondary | ICD-10-CM

## 2020-06-25 DIAGNOSIS — Z131 Encounter for screening for diabetes mellitus: Secondary | ICD-10-CM

## 2020-06-25 DIAGNOSIS — Z1321 Encounter for screening for nutritional disorder: Secondary | ICD-10-CM

## 2020-06-25 MED ORDER — FLUCONAZOLE 150 MG PO TABS: 150.0000 mg | ORAL_TABLET | Freq: Once | ORAL | 0 refills | Status: AC

## 2020-06-25 NOTE — Patient Instructions (Signed)
I value your feedback and entrusting us with your care. If you get a [facility] patient survey, I would appreciate you taking the time to let us know about your experience today. Thank you!  As of September 08, 2019, your lab results will be released to your MyChart immediately, before I even have a chance to see them. Please give me time to review them and contact you if there are any abnormalities. Thank you for your patience.  

## 2020-06-25 NOTE — Progress Notes (Signed)
PCP:  Steele Sizer, MD   Chief Complaint  Patient presents with  . Gynecologic Exam     HPI:      [name] is a 34 y.o. [id number] whose LMP was No LMP recorded. Patient has had an ablation., presents today for her annual examination.  Her menses are random BTB, light spotting with wiping since endometrial ablation. Has occas dysmen, improved with NSAIDs.  Sex activity: single partner, contraception - tubal ligation.  Last Pap: 05/07/18  Results were: no abnormalities /neg HPV DNA Hx of STDs: HSV  Hx of recurrent yeast vag, UTIs in past. Is using dove sens skin soap, no dryer sheets, still wearing thongs daily, no wipes. Has done wkly diflucan in past with sx relief but she stopped doing it. Last treated 2 wks ago due to recurrent sx with sx relief. Not taking probiotics but plans to start.   There is no FH of breast cancer. There is a FH of ovarian cancer in her MGM in her 42s, then died with cancer mets in early 28s. Pt's mom almost certain it was ovarian and not cx. Genetic testing not done. The patient does not do self-breast exams.  Tobacco use: The patient denies current or previous tobacco use. Alcohol use: social drinker No drug use.  Exercise: not active  She does get adequate calcium but not Vitamin D in her diet. S/p bariatric surg, hasn't had recent labs. Wants letter for [facility] for MVI and probiotics.  Past Medical History:  Diagnosis Date  . B12 deficiency   . Family history of adverse reaction to anesthesia    MATERNAL AUNT-PT UNAWARE OF WHAT HAPPENED WITH ANESTHESIA BUT STATES HER AUNT HAS HAD PROBLEMS IN PAST   . Genital herpes   . Headache    MIGRAINE  . Hyperlipidemia   . Hypertension   . Migraine   . Obesity   . PONV (postoperative nausea and vomiting)   . Syncope 04-2015    Past Surgical History:  Procedure Laterality Date  . APPENDECTOMY    . BUNIONECTOMY    . CESAREAN SECTION     X2  . DIAGNOSTIC LAPAROSCOPY    . ENDOMETRIAL BIOPSY      . GASTRIC BYPASS    . LASIK    . MOUTH SURGERY    . NOVASURE ABLATION N/A 06/29/2015   Procedure: DILATION AND CURETTAGE, HYSTEROSCOPY, NOVASURE ABLATION;  Surgeon: Malachy Mood, MD;  Location: [facility] ORS;  Service: Gynecology;  Laterality: N/A;  . TUBAL LIGATION    . WISDOM TOOTH EXTRACTION      Family History  Problem Relation Age of Onset  . Obesity Mother   . Hyperlipidemia Mother   . Obesity Sister   . Obesity Brother   . Obesity Sister   . Ovarian cancer Maternal Grandmother 26       Cervical/Sqammacell    Social History   Socioeconomic History  . Marital status: Married    Spouse name: Not on file  . Number of children: 2  . Years of education: Not on file  . Highest education level: Some college, no degree  Occupational History  . Occupation: Multimedia programmer   Tobacco Use  . Smoking status: Never Smoker  . Smokeless tobacco: Never Used  Vaping Use  . Vaping Use: Never used  Substance and Sexual Activity  . Alcohol use: Yes    Comment: RARE  . Drug use: No  . Sexual activity: Yes    Partners: Male  Birth control/protection: Surgical    Comment: Ablation  Other Topics Concern  . Not on file  Social History Narrative      She also goes to school full time at [employer] Strain:   . Difficulty of Paying Living Expenses: Not on file  Food Insecurity:   . Worried About Charity fundraiser in the Last Year: Not on file  . Ran Out of Food in the Last Year: Not on file  Transportation Needs:   . Lack of Transportation (Medical): Not on file  . Lack of Transportation (Non-Medical): Not on file  Physical Activity:   . Days of Exercise per Week: Not on file  . Minutes of Exercise per Session: Not on file  Stress:   . Feeling of Stress : Not on file  Social Connections:   . Frequency of Communication with Friends and Family: Not on file  . Frequency of Social Gatherings with Friends and Family: Not  on file  . Attends Religious Services: Not on file  . Active Member of Clubs or Organizations: Not on file  . Attends Archivist Meetings: Not on file  . Marital Status: Not on file  Intimate Partner Violence:   . Fear of Current or Ex-Partner: Not on file  . Emotionally Abused: Not on file  . Physically Abused: Not on file  . Sexually Abused: Not on file     Current Outpatient Medications:  .  fluconazole (DIFLUCAN) 150 MG tablet, Take 1 tablet (150 mg total) by mouth once for 1 dose. Once weekly as preventive, Disp: 15 tablet, Rfl: 0     ROS:  Review of Systems  Constitutional: Negative for fatigue, fever and unexpected weight change.  Respiratory: Negative for cough, shortness of breath and wheezing.   Cardiovascular: Negative for chest pain, palpitations and leg swelling.  Gastrointestinal: Negative for blood in stool, constipation, diarrhea, nausea and vomiting.  Endocrine: Negative for cold intolerance, heat intolerance and polyuria.  Genitourinary: Negative for dyspareunia, dysuria, flank pain, frequency, genital sores, hematuria, menstrual problem, pelvic pain, urgency, vaginal bleeding, vaginal discharge and vaginal pain.  Musculoskeletal: Negative for back pain, joint swelling and myalgias.  Skin: Negative for rash.  Neurological: Positive for headaches. Negative for dizziness, syncope, light-headedness and numbness.  Hematological: Negative for adenopathy.  Psychiatric/Behavioral: Negative for agitation, confusion, sleep disturbance and suicidal ideas. The patient is not nervous/anxious.   BREAST: No symptoms   Objective: BP 110/80   Ht 5\' 3"  (1.6 m)   Wt 226 lb (102.5 kg)   BMI 40.03 kg/m    Physical Exam Constitutional:      Appearance: She is well-developed.  Genitourinary:     Vulva, vagina, cervix, uterus, right adnexa and left adnexa normal.     No vulval lesion or tenderness noted.     No vaginal discharge, erythema or tenderness.     No  cervical polyp.     Uterus is not enlarged or tender.     No right or left adnexal mass present.     Right adnexa not tender.     Left adnexa not tender.  Neck:     Thyroid: No thyromegaly.  Cardiovascular:     Rate and Rhythm: Normal rate and regular rhythm.     Heart sounds: Normal heart sounds. No murmur heard.   Pulmonary:     Effort: Pulmonary effort is normal.     Breath sounds: Normal breath sounds.  Chest:     Breasts:        Right: No mass, nipple discharge, skin change or tenderness.        Left: No mass, nipple discharge, skin change or tenderness.  Abdominal:     Palpations: Abdomen is soft.     Tenderness: There is no abdominal tenderness. There is no guarding.  Musculoskeletal:        General: Normal range of motion.     Cervical back: Normal range of motion.  Neurological:     General: No focal deficit present.     Mental Status: She is alert and oriented to person, place, and time.     Cranial Nerves: No cranial nerve deficit.  Skin:    General: Skin is warm and dry.  Psychiatric:        Mood and Affect: Mood normal.        Behavior: Behavior normal.        Thought Content: Thought content normal.        Judgment: Judgment normal.  Vitals reviewed.     Assessment/Plan: Encounter for annual routine gynecological examination  Yeast vaginitis - Plan: fluconazole (DIFLUCAN) 150 MG tablet; D/C thongs, start diflucan wkly for 3 mos. Rx eRxd.   Blood tests for routine general physical examination - Plan: CBC with Differential/Platelet, Comprehensive metabolic panel, Hgb Z7Q w/o eAG, Thyroid Panel With TSH, VITAMIN D 25 Hydroxy (Vit-D Deficiency, Fractures), B12 and Folate Panel, Iron, TIBC and Ferritin Panel  S/P bariatric surgery - Plan: CBC with Differential/Platelet, Comprehensive metabolic panel, Hgb B3A w/o eAG, Thyroid Panel With TSH, VITAMIN D 25 Hydroxy (Vit-D Deficiency, Fractures), B12 and Folate Panel, Iron, TIBC and Ferritin Panel  Encounter for  vitamin deficiency screening - Plan: VITAMIN D 25 Hydroxy (Vit-D Deficiency, Fractures), B12 and Folate Panel, Iron, TIBC and Ferritin Panel  Thyroid disorder screening - Plan: Thyroid Panel With TSH  Screening for diabetes mellitus - Plan: Hgb A1c w/o eAG  Meds ordered this encounter  Medications  . fluconazole (DIFLUCAN) 150 MG tablet    Sig: Take 1 tablet (150 mg total) by mouth once for 1 dose. Once weekly as preventive    Dispense:  15 tablet    Refill:  0    Order Specific Question:   Supervising Provider    Answer:   Gae Dry [[id number]]             GYN counsel adequate intake of calcium and vitamin D, diet and exercise     F/U  Return in about 1 year (around 06/25/2021).  Jean Griess B. Shaquille Murdy, PA-C 06/25/2020 4:54 PM

## 2020-06-26 ENCOUNTER — Encounter: Payer: Self-pay | Admitting: Obstetrics and Gynecology

## 2020-06-26 LAB — COMPREHENSIVE METABOLIC PANEL
ALT: 15 IU/L (ref 0–32)
AST: 22 IU/L (ref 0–40)
Albumin/Globulin Ratio: 1.6 (ref 1.2–2.2)
Albumin: 4.3 g/dL (ref 3.8–4.8)
Alkaline Phosphatase: 117 IU/L (ref 44–121)
BUN/Creatinine Ratio: 11 (ref 9–23)
BUN: 8 mg/dL (ref 6–20)
Bilirubin Total: 0.2 mg/dL (ref 0.0–1.2)
CO2: 26 mmol/L (ref 20–29)
Calcium: 9.9 mg/dL (ref 8.7–10.2)
Chloride: 102 mmol/L (ref 96–106)
Creatinine, Ser: 0.76 mg/dL (ref 0.57–1.00)
GFR calc Af Amer: 118 mL/min/{1.73_m2} (ref >59)
GFR calc non Af Amer: 103 mL/min/{1.73_m2} (ref >59)
Globulin, Total: 2.7 g/dL (ref 1.5–4.5)
Glucose: 91 mg/dL (ref 65–99)
Potassium: 4.3 mmol/L (ref 3.5–5.2)
Sodium: 140 mmol/L (ref 134–144)
Total Protein: 7 g/dL (ref 6.0–8.5)

## 2020-06-26 LAB — IRON,TIBC AND FERRITIN PANEL
Ferritin: 17 ng/mL (ref 15–150)
Iron Saturation: 23 % (ref 15–55)
Iron: 101 ug/dL (ref 27–159)
Total Iron Binding Capacity: 440 ug/dL (ref 250–450)
UIBC: 339 ug/dL (ref 131–425)

## 2020-06-26 LAB — CBC WITH DIFFERENTIAL/PLATELET
Basophils Absolute: 0.1 10*3/uL (ref 0.0–0.2)
Basos: 1 %
EOS (ABSOLUTE): 0.2 10*3/uL (ref 0.0–0.4)
Eos: 2 %
Hematocrit: 38.1 % (ref 34.0–46.6)
Hemoglobin: 12.7 g/dL (ref 11.1–15.9)
Immature Grans (Abs): 0 10*3/uL (ref 0.0–0.1)
Immature Granulocytes: 0 %
Lymphocytes Absolute: 2.4 10*3/uL (ref 0.7–3.1)
Lymphs: 33 %
MCH: 29.8 pg (ref 26.6–33.0)
MCHC: 33.3 g/dL (ref 31.5–35.7)
MCV: 89 fL (ref 79–97)
Monocytes Absolute: 0.5 10*3/uL (ref 0.1–0.9)
Monocytes: 6 %
Neutrophils Absolute: 4.2 10*3/uL (ref 1.4–7.0)
Neutrophils: 58 %
Platelets: 390 10*3/uL (ref 150–450)
RBC: 4.26 x10E6/uL (ref 3.77–5.28)
RDW: 12.4 % (ref 11.7–15.4)
WBC: 7.3 10*3/uL (ref 3.4–10.8)

## 2020-06-26 LAB — THYROID PANEL WITH TSH
Free Thyroxine Index: >4.1 (ref 1.2–4.9)
T3 Uptake Ratio: >56 % — ABNORMAL HIGH (ref 24–39)
T4, Total: 7.4 ug/dL (ref 4.5–12.0)
TSH: 1.51 u[IU]/mL (ref 0.450–4.500)

## 2020-06-26 LAB — HGB A1C W/O EAG: Hgb A1c MFr Bld: 5.3 % (ref 4.8–5.6)

## 2020-06-26 LAB — B12 AND FOLATE PANEL
Folate: 4.9 ng/mL (ref >3.0)
Vitamin B-12: 574 pg/mL (ref 232–1245)

## 2020-06-26 LAB — VITAMIN D 25 HYDROXY (VIT D DEFICIENCY, FRACTURES): Vit D, 25-Hydroxy: 22.3 ng/mL — ABNORMAL LOW (ref 30.0–100.0)

## 2020-07-13 ENCOUNTER — Telehealth: Payer: Self-pay

## 2020-07-13 NOTE — Telephone Encounter (Signed)
Received fax from [facility] stating test was cancelled due to genetic counseling not able to get in touch with pt. They tried 3 times. Is pt still interested? She needs to call [phone] to continue with test. Called pt, no answer, [facility]. Will send mychart msg also.

## 2020-08-07 ENCOUNTER — Encounter: Payer: Self-pay | Admitting: Obstetrics and Gynecology

## 2020-10-09 ENCOUNTER — Telehealth: Payer: Self-pay | Admitting: Family Medicine

## 2020-10-09 NOTE — Telephone Encounter (Signed)
Patient called in inquiring if she can be a new patient of Clearence Cheek. Pt husband Jean Pitts [birth year] is current patient and she feels it would be best to be seen by her too. Please advise if possible. Pt is concerned as she is currently positive for COVID. Test came back last night.

## 2020-10-09 NOTE — Telephone Encounter (Signed)
Yes, I am happy to accept her. We can add her on virtually for 10/10/20 at 1140.

## 2020-10-12 NOTE — Telephone Encounter (Signed)
Called patient and scheduled NP appt as per new PCP.

## 2020-10-31 ENCOUNTER — Other Ambulatory Visit: Payer: Self-pay

## 2020-10-31 ENCOUNTER — Encounter: Payer: Self-pay | Admitting: Primary Care

## 2020-10-31 ENCOUNTER — Ambulatory Visit (INDEPENDENT_AMBULATORY_CARE_PROVIDER_SITE_OTHER): Payer: BLUE CROSS/BLUE SHIELD | Admitting: Primary Care

## 2020-10-31 VITALS — BP 122/84 | HR 85 | Temp 98.4°F | Ht 63.5 in | Wt 247.0 lb

## 2020-10-31 DIAGNOSIS — D519 Vitamin B12 deficiency anemia, unspecified: Secondary | ICD-10-CM | POA: Diagnosis not present

## 2020-10-31 DIAGNOSIS — Z9884 Bariatric surgery status: Secondary | ICD-10-CM | POA: Diagnosis not present

## 2020-10-31 DIAGNOSIS — G43009 Migraine without aura, not intractable, without status migrainosus: Secondary | ICD-10-CM

## 2020-10-31 MED ORDER — TOPIRAMATE 50 MG PO TABS: 50.0000 mg | ORAL_TABLET | Freq: Every day | ORAL | 0 refills | Status: DC

## 2020-10-31 NOTE — Assessment & Plan Note (Signed)
B12 level from September 2021 stable. Continue to monitor.

## 2020-10-31 NOTE — Assessment & Plan Note (Signed)
Chronic for years occurring 1-2 times monthly, also with weekly headaches.  Agree to resume Topamax for prevention. Start with 50 mg HS. Titrate up slowly if needed. She was once on 200 mg but this caused too much drowsiness.  She will update. Continue PRN Imitrex injections.

## 2020-10-31 NOTE — Progress Notes (Signed)
Subjective:    Patient ID: Jean Pitts, female    DOB: April 27, 1986, 35 y.o.   MRN: [id number]  HPI  This visit occurred during the SARS-CoV-2 public health emergency.  Safety protocols were in place, including screening questions prior to the visit, additional usage of staff PPE, and extensive cleaning of exam room while observing appropriate contact time as indicated for disinfecting solutions.   Jean Pitts is a 35 year old female who presents today to establish care and discuss the problems mentioned below. Will obtain/review records.  1) Migraines: Diagnosed originally in teenage years, strong family history of migraines. Mostly experiences migraines to the frontal lobes, parietal lobes, "squeezing" sensation to her head, neck and shoulder stiffness. She does experience photophobia, phonophobia, nausea during migraines. Migraines occur once to several times monthly, headaches occur 2-3 times weekly.   She currently has Imitrex injections for which she uses now for abortive treatment. She was once managed on Topamax in the past for prevention which was effective, she would like to resume.   2) History of Bariatric Surgery/Vitamin B12 deficiency: Lap gastric bypass completed in August 2014. Has not seen her general surgeon. Has been off and on Phentermine, HCG injections for years, plans to resume soon, follows with [facility].  BP Readings from Last 3 Encounters:  10/31/20 122/84  06/25/20 110/80  08/02/19 120/80     Review of Systems  Constitutional: Negative for unexpected weight change.  Eyes:       Photophobia with migraines  Respiratory: Negative for shortness of breath.   Cardiovascular: Negative for chest pain.  Gastrointestinal:       Nausea with migraines   Neurological: Positive for headaches.       Past Medical History:  Diagnosis Date  . B12 deficiency   . Family history of adverse reaction to anesthesia    MATERNAL AUNT-PT UNAWARE OF WHAT HAPPENED  WITH ANESTHESIA BUT STATES HER AUNT HAS HAD PROBLEMS IN PAST   . Genital herpes   . Headache    MIGRAINE  . Hyperlipidemia   . Hypertension   . Migraine   . Obesity   . PONV (postoperative nausea and vomiting)   . Syncope 04-2015  . Vitamin D deficiency      Social History   Socioeconomic History  . Marital status: Married    Spouse name: Not on file  . Number of children: 2  . Years of education: Not on file  . Highest education level: Some college, no degree  Occupational History  . Occupation: Multimedia programmer   Tobacco Use  . Smoking status: Never Smoker  . Smokeless tobacco: Never Used  Vaping Use  . Vaping Use: Never used  Substance and Sexual Activity  . Alcohol use: Yes    Comment: RARE  . Drug use: No  . Sexual activity: Yes    Partners: Male    Birth control/protection: Surgical    Comment: Ablation  Other Topics Concern  . Not on file  Social History Narrative      She also goes to school full time at [place] [employer] Strain: Not on file  Food Insecurity: Not on file  Transportation Needs: Not on file  Physical Activity: Not on file  Stress: Not on file  Social Connections: Not on file  Intimate Partner Violence: Not on file    Past Surgical History:  Procedure Laterality Date  . APPENDECTOMY    . BUNIONECTOMY    .  CESAREAN SECTION     X2  . DIAGNOSTIC LAPAROSCOPY    . ENDOMETRIAL BIOPSY    . GASTRIC BYPASS    . LASIK    . MOUTH SURGERY     Tongue Cyst  . NOVASURE ABLATION N/A 06/29/2015   Procedure: DILATION AND CURETTAGE, HYSTEROSCOPY, NOVASURE ABLATION;  Surgeon: Malachy Mood, MD;  Location: [facility] ORS;  Service: Gynecology;  Laterality: N/A;  . TUBAL LIGATION    . WISDOM TOOTH EXTRACTION      Family History  Problem Relation Age of Onset  . Obesity Mother   . Hyperlipidemia Mother   . Obesity Sister   . Obesity Brother   . Obesity Sister   . Ovarian cancer Maternal Grandmother  26       Cervical/Sqammacell    Allergies  Allergen Reactions  . Vicodin [Hydrocodone-Acetaminophen] Nausea And Vomiting  . Codeine Other (See Comments)    Other reaction(s): Other (See Comments)  . Hydrocodone-Acetaminophen Nausea And Vomiting    Current Outpatient Medications on File Prior to Visit  Medication Sig Dispense Refill  . fluconazole (DIFLUCAN) 150 MG tablet Take 1 tablet by mouth once a week.    . SUMAtriptan Succinate Refill (IMITREX STATDOSE REFILL) 6 MG/0.5ML SOCT Inject into the skin.     No current facility-administered medications on file prior to visit.    BP 122/84   Pulse 85   Temp 98.4 F (36.9 C) (Temporal)   Ht 5' 3.5" (1.613 m)   Wt 247 lb (112 kg)   SpO2 98%   BMI 43.07 kg/m    Objective:   Physical Exam Constitutional:      Appearance: She is well-nourished.  Cardiovascular:     Rate and Rhythm: Normal rate and regular rhythm.  Pulmonary:     Effort: Pulmonary effort is normal.     Breath sounds: Normal breath sounds.  Musculoskeletal:     Cervical back: Neck supple.  Skin:    General: Skin is warm and dry.  Neurological:     Mental Status: She is oriented to person, place, and time.  Psychiatric:        Mood and Affect: Mood and affect and mood normal.            Assessment & Plan:

## 2020-10-31 NOTE — Assessment & Plan Note (Signed)
Labs completed in September 2021, overall stable. Discussed to start vitamin D 2000 IU daily.  Will repeat all labs at upcoming CPE.

## 2020-10-31 NOTE — Patient Instructions (Addendum)
Start topiramate (Topamax) 50 mg nightly for headaches. Increase the dose gradually by half tablet in 2 weeks if needed. Keep me updated.  Start vitamin D 2000 IU daily.   Please schedule a physical to meet with me in 2 months. You may also schedule a lab only appointment 3-4 days prior, but this is not required. We will discuss your lab results in detail during your physical.  It was a pleasure to meet you today! Please don't hesitate to call or message me with any questions. Welcome to [facility]!

## 2020-11-23 ENCOUNTER — Encounter: Payer: Self-pay | Admitting: Obstetrics and Gynecology

## 2020-11-23 ENCOUNTER — Other Ambulatory Visit: Payer: Self-pay | Admitting: Obstetrics and Gynecology

## 2020-11-23 MED ORDER — FLUCONAZOLE 150 MG PO TABS: 150.0000 mg | ORAL_TABLET | ORAL | 0 refills | Status: DC

## 2020-11-23 NOTE — Progress Notes (Signed)
Rx RF diflucan qwk as preventive.

## 2020-12-20 ENCOUNTER — Telehealth: Payer: Self-pay | Admitting: Obstetrics and Gynecology

## 2020-12-20 ENCOUNTER — Other Ambulatory Visit: Payer: Self-pay

## 2020-12-20 ENCOUNTER — Ambulatory Visit: Payer: BLUE CROSS/BLUE SHIELD | Admitting: Primary Care

## 2020-12-20 ENCOUNTER — Ambulatory Visit: Payer: BLUE CROSS/BLUE SHIELD | Admitting: Obstetrics and Gynecology

## 2020-12-20 ENCOUNTER — Encounter: Payer: Self-pay | Admitting: Primary Care

## 2020-12-20 VITALS — BP 118/72 | HR 88 | Temp 98.3°F | Wt 231.2 lb

## 2020-12-20 DIAGNOSIS — R1084 Generalized abdominal pain: Secondary | ICD-10-CM

## 2020-12-20 DIAGNOSIS — Z1159 Encounter for screening for other viral diseases: Secondary | ICD-10-CM

## 2020-12-20 DIAGNOSIS — K921 Melena: Secondary | ICD-10-CM

## 2020-12-20 HISTORY — DX: Generalized abdominal pain: R10.84

## 2020-12-20 HISTORY — DX: Melena: K92.1

## 2020-12-20 LAB — HEMOCCULT GUIAC POC 1CARD (OFFICE): Fecal Occult Blood, POC: POSITIVE — AB

## 2020-12-20 MED ORDER — PANTOPRAZOLE SODIUM 40 MG PO TBEC: 40.0000 mg | DELAYED_RELEASE_TABLET | Freq: Two times a day (BID) | ORAL | 0 refills | Status: DC

## 2020-12-20 NOTE — Assessment & Plan Note (Signed)
Acute for the last two days, noted on exam today. Positive hemoccult card today.  Differentials include upper GI bleed, H pylori ulcer, other gastric ulcer. Less likely diverticulitis, crohn's, ulcerative colitis, colitis as she doesn't appear to have an infectious process.   Checking labs today including CBC, CMP, Lipase, H pylori.   Start pantoprazole 40 mg BID, but not until she completes  H pylori sample. Discussed this today.  Stat referral placed to GI> await results. Strict ED precautions provided.

## 2020-12-20 NOTE — Patient Instructions (Incomplete)
I value your feedback and you entrusting us with your care. If you get a [facility] patient survey, I would appreciate you taking the time to let us know about your experience today. Thank you! ? ? ?

## 2020-12-20 NOTE — Patient Instructions (Addendum)
Stop by the lab prior to leaving today. I will notify you of your results once received.   Start pantoprazole 40 mg twice daily before meals for suspected stomach bleed. Do NOT start this until you've completed the stool sample.  Please meet with with either Ashtyn or Anastasiya regarding your referral to GI.  Go to the emergency department if you feel worse.  It was a pleasure to see you today!

## 2020-12-20 NOTE — Telephone Encounter (Signed)
Spoke with pt. Having mid to lower crampy abd pains for the past wk, some are sharp and cause pt to double over. Also with loose stools, some with red blood, having black tarry stools for the past 2 days. No fevers. Just now having nausea, a little dizziness today. Sx not relieved with midol.  Pt s/p endometrial ablation, so has occas light bleeding with wiping. Having some now. Suggested pt f/u with PCP. Pelvic pain most likely GI not GYN.

## 2020-12-20 NOTE — Progress Notes (Signed)
Subjective:    Patient ID: Jean Pitts, female    DOB: 11-21-1985, 35 y.o.   MRN: [id number]  HPI  Jean Pitts is a very pleasant 35 y.o. female with a history of PCOS, s/p bariatric surgery, PCOS, endometrial ablation who presents today with a chief complaint of abdominal pain.  Her abdominal pain is generalized for which she describes as cramping with associated nausea, and dizziness.   Since then her cramping, dizziness, nausea has progressed which has waxed and waned, more intense overall. Two nights ago she noticed a dark, tarry stool. Also with dark tarry stool last night and this morning. No diarrhea.   Last night her symptoms escalated. She woke from sleep at 4 am with intense abdominal cramping. Upon getting up for the day she felt dizzy but proceeded to work, unfortunately had leave work today because she was so dizzy. She noticed on occurrence of trace dark blood vaginally. Doesn't typically experience menses due to endometrial ablation.     She's taken Midol, Ibuprofen without improvement. She took a few chewable Pepto Bismol tablets four days ago without improvement. She denies constipation, diarrhea, bright red rectal bleeding recently, vomiting, recurrent NSAID use, increased stress. She does noticed bright red rectal bleeding on occasion with bowel movements, overall infrequent.   She's not eating much, is able to drink fluids. She is not taking vitamins for gastric bypass status.   Review of Systems  Constitutional: Positive for fatigue. Negative for fever.  Cardiovascular: Negative for palpitations.  Gastrointestinal: Positive for abdominal pain, blood in stool and nausea. Negative for constipation, diarrhea and vomiting.         Past Medical History:  Diagnosis Date  . B12 deficiency   . Family history of adverse reaction to anesthesia    MATERNAL AUNT-PT UNAWARE OF WHAT HAPPENED WITH ANESTHESIA BUT STATES HER AUNT HAS HAD PROBLEMS IN PAST   . Genital  herpes   . Headache    MIGRAINE  . Hyperlipidemia   . Hypertension   . Migraine   . Obesity   . PONV (postoperative nausea and vomiting)   . Syncope 04-2015  . Vitamin D deficiency     Social History   Socioeconomic History  . Marital status: Married    Spouse name: Not on file  . Number of children: 2  . Years of education: Not on file  . Highest education level: Some college, no degree  Occupational History  . Occupation: Multimedia programmer   Tobacco Use  . Smoking status: Never Smoker  . Smokeless tobacco: Never Used  Vaping Use  . Vaping Use: Never used  Substance and Sexual Activity  . Alcohol use: Yes    Comment: RARE  . Drug use: No  . Sexual activity: Yes    Partners: Male    Birth control/protection: Surgical    Comment: Ablation  Other Topics Concern  . Not on file  Social History Narrative      She also goes to school full time at [employer] Strain: Not on file  Food Insecurity: Not on file  Transportation Needs: Not on file  Physical Activity: Not on file  Stress: Not on file  Social Connections: Not on file  Intimate Partner Violence: Not on file    Past Surgical History:  Procedure Laterality Date  . APPENDECTOMY    . BUNIONECTOMY    . CESAREAN SECTION     X2  .  DIAGNOSTIC LAPAROSCOPY    . ENDOMETRIAL BIOPSY    . GASTRIC BYPASS    . LASIK    . MOUTH SURGERY     Tongue Cyst  . NOVASURE ABLATION N/A 06/29/2015   Procedure: DILATION AND CURETTAGE, HYSTEROSCOPY, NOVASURE ABLATION;  Surgeon: Malachy Mood, MD;  Location: [facility] ORS;  Service: Gynecology;  Laterality: N/A;  . TUBAL LIGATION    . WISDOM TOOTH EXTRACTION      Family History  Problem Relation Age of Onset  . Obesity Mother   . Hyperlipidemia Mother   . Obesity Sister   . Obesity Brother   . Obesity Sister   . Ovarian cancer Maternal Grandmother 26       Cervical/Sqammacell    Allergies  Allergen Reactions  .  Vicodin [Hydrocodone-Acetaminophen] Nausea And Vomiting  . Codeine Other (See Comments)    Other reaction(s): Other (See Comments)  . Hydrocodone-Acetaminophen Nausea And Vomiting    Current Outpatient Medications on File Prior to Visit  Medication Sig Dispense Refill  . fluconazole (DIFLUCAN) 150 MG tablet Take 1 tablet (150 mg total) by mouth once a week. As preventive 12 tablet 0  . SUMAtriptan Succinate Refill 6 MG/0.5ML SOCT Inject into the skin.    Marland Kitchen topiramate (TOPAMAX) 50 MG tablet Take 1 tablet (50 mg total) by mouth at bedtime. For headache prevention. 90 tablet 0   No current facility-administered medications on file prior to visit.    BP 118/72   Pulse 88   Temp 98.3 F (36.8 C) (Temporal)   Wt 231 lb 4 oz (104.9 kg)   SpO2 100%   BMI 40.32 kg/m  Objective:   Physical Exam Cardiovascular:     Rate and Rhythm: Normal rate and regular rhythm.  Abdominal:     General: Abdomen is flat.     Palpations: Abdomen is soft.     Tenderness: There is no abdominal tenderness. There is no guarding.  Skin:    Coloration: Skin is pale.  Neurological:     Mental Status: She is alert.           Assessment & Plan:      This visit occurred during the SARS-CoV-2 public health emergency.  Safety protocols were in place, including screening questions prior to the visit, additional usage of staff PPE, and extensive cleaning of exam room while observing appropriate contact time as indicated for disinfecting solutions.

## 2020-12-21 ENCOUNTER — Other Ambulatory Visit: Payer: BLUE CROSS/BLUE SHIELD

## 2020-12-21 ENCOUNTER — Telehealth: Payer: Self-pay

## 2020-12-21 ENCOUNTER — Ambulatory Visit
Admission: RE | Admit: 2020-12-21 | Discharge: 2020-12-21 | Disposition: A | Discharge status: Home / Self Care | Payer: BLUE CROSS/BLUE SHIELD | Source: Ambulatory Visit | Attending: Primary Care | Admitting: Primary Care

## 2020-12-21 DIAGNOSIS — K921 Melena: Secondary | ICD-10-CM | POA: Insufficient documentation

## 2020-12-21 DIAGNOSIS — R1084 Generalized abdominal pain: Secondary | ICD-10-CM

## 2020-12-21 LAB — COMPREHENSIVE METABOLIC PANEL
ALT: 14 U/L (ref 0–35)
AST: 18 U/L (ref 0–37)
Albumin: 4.3 g/dL (ref 3.5–5.2)
Alkaline Phosphatase: 73 U/L (ref 39–117)
BUN: 20 mg/dL (ref 6–23)
CO2: 25 mEq/L (ref 19–32)
Calcium: 9.1 mg/dL (ref 8.4–10.5)
Chloride: 107 mEq/L (ref 96–112)
Creatinine, Ser: 0.75 mg/dL (ref 0.40–1.20)
GFR: 103.8 mL/min (ref >60.00)
Glucose, Bld: 92 mg/dL (ref 70–99)
Potassium: 5 mEq/L (ref 3.5–5.1)
Sodium: 140 mEq/L (ref 135–145)
Total Bilirubin: 0.2 mg/dL (ref 0.2–1.2)
Total Protein: 6.9 g/dL (ref 6.0–8.3)

## 2020-12-21 LAB — CBC WITH DIFFERENTIAL/PLATELET
Basophils Absolute: 0.1 10*3/uL (ref 0.0–0.1)
Basophils Relative: 0.7 % (ref 0.0–3.0)
Eosinophils Absolute: 0.1 10*3/uL (ref 0.0–0.7)
Eosinophils Relative: 0.9 % (ref 0.0–5.0)
HCT: 30.6 % — ABNORMAL LOW (ref 36.0–46.0)
Hemoglobin: 10.5 g/dL — ABNORMAL LOW (ref 12.0–15.0)
Lymphocytes Relative: 29.5 % (ref 12.0–46.0)
Lymphs Abs: 2.1 10*3/uL (ref 0.7–4.0)
MCHC: 34.3 g/dL (ref 30.0–36.0)
MCV: 87.8 fl (ref 78.0–100.0)
Monocytes Absolute: 0.3 10*3/uL (ref 0.1–1.0)
Monocytes Relative: 4.1 % (ref 3.0–12.0)
Neutro Abs: 4.7 10*3/uL (ref 1.4–7.7)
Neutrophils Relative %: 64.8 % (ref 43.0–77.0)
Platelets: 332 10*3/uL (ref 150.0–400.0)
RBC: 3.48 Mil/uL — ABNORMAL LOW (ref 3.87–5.11)
RDW: 14.3 % (ref 11.5–15.5)
WBC: 7.3 10*3/uL (ref 4.0–10.5)

## 2020-12-21 LAB — IBC + FERRITIN
Ferritin: 7.8 ng/mL — ABNORMAL LOW (ref 10.0–291.0)
Iron: 35 ug/dL — ABNORMAL LOW (ref 42–145)
Saturation Ratios: 7.6 % — ABNORMAL LOW (ref 20.0–50.0)
Transferrin: 328 mg/dL (ref 212.0–360.0)

## 2020-12-21 LAB — LIPASE: Lipase: 37 U/L (ref 11.0–59.0)

## 2020-12-21 LAB — HEPATITIS C ANTIBODY
Hepatitis C Ab: NONREACTIVE
SIGNAL TO CUT-OFF: 0 (ref <1.00)

## 2020-12-21 IMAGING — CT CT ABD-PELV W/ CM
2 of 6 series · 17 of 46 positions shown, 19 images · IV contrast (APPLIED)
Comparison: None.

CLINICAL DATA: Dark tarry stools, hemoccult positive, generalized
abdominal cramping

EXAM:
CT ABDOMEN AND PELVIS WITH CONTRAST
TECHNIQUE: Multidetector CT imaging of the abdomen and pelvis was performed
using the standard protocol following bolus administration of
intravenous contrast.
CONTRAST:  100mL OMNIPAQUE IOHEXOL 300 MG/ML  SOLN

[Series 2: routine abd/pel with · axial · 0.98mm/px · z∈[-961,-571]mm · 14 of 90 slices shown, 16 images]
[im 6/90  soft-tissue]
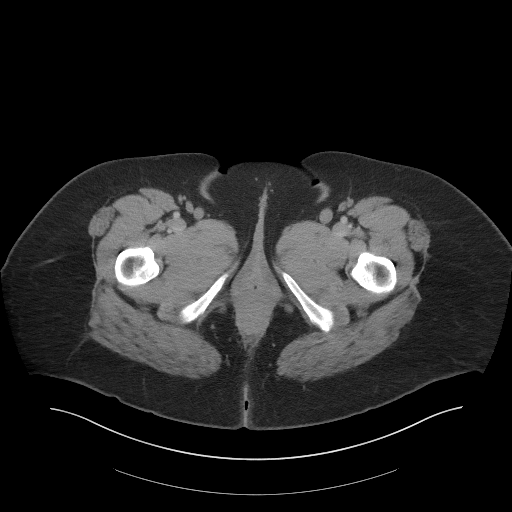
[im 6/90  bone]
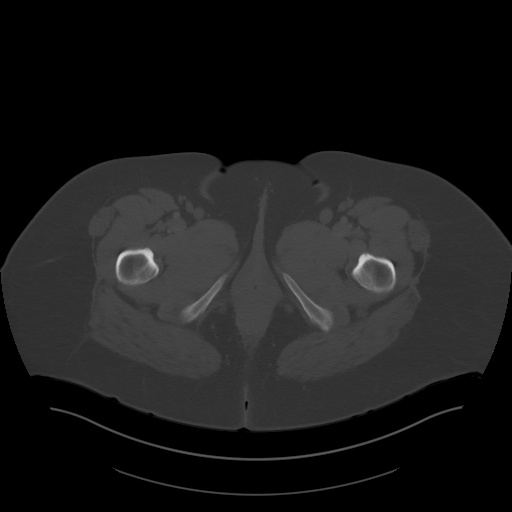
[im 11/90  soft-tissue]
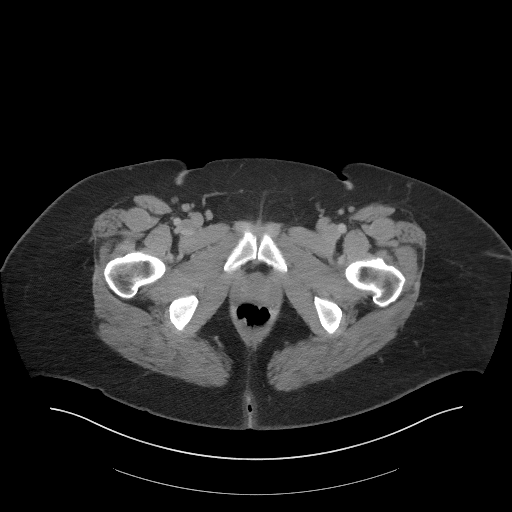
[im 16/90  soft-tissue]
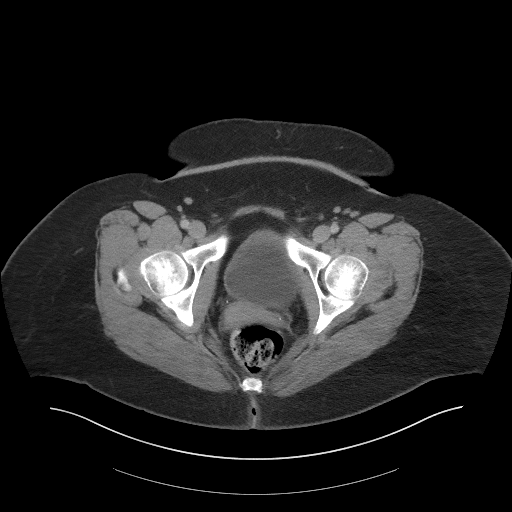
[im 27/90  soft-tissue]
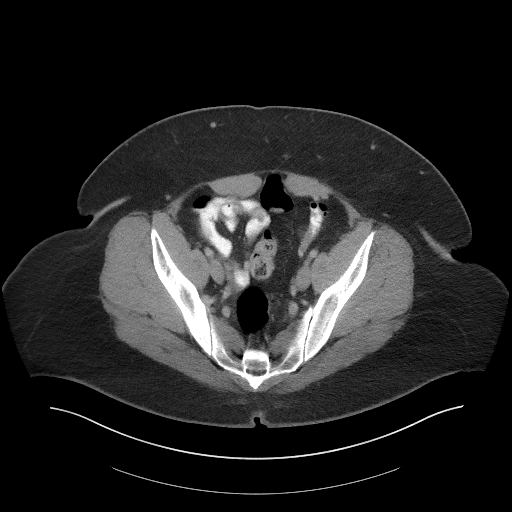
[im 32/90  soft-tissue]
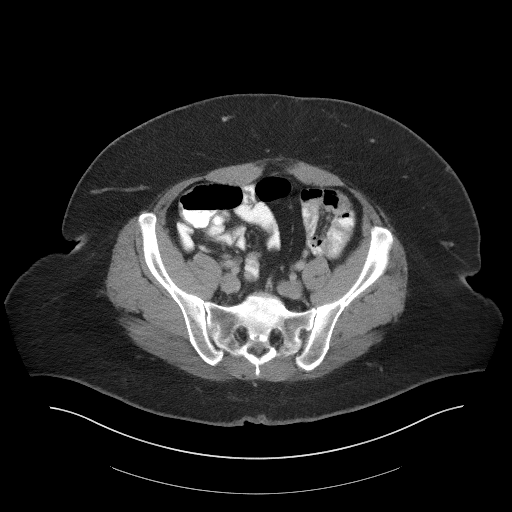
[im 37/90  soft-tissue]
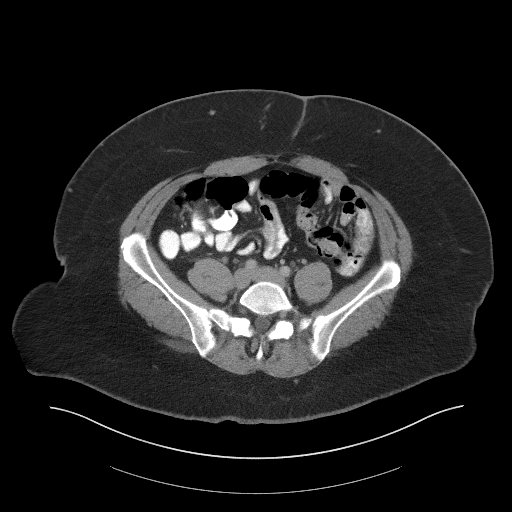
[im 42/90  soft-tissue]
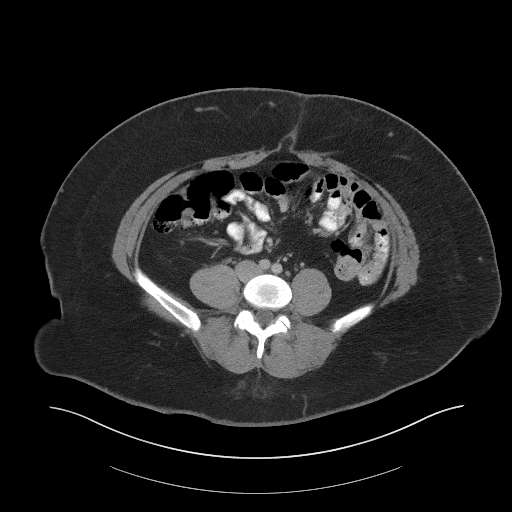
[im 48/90  soft-tissue]
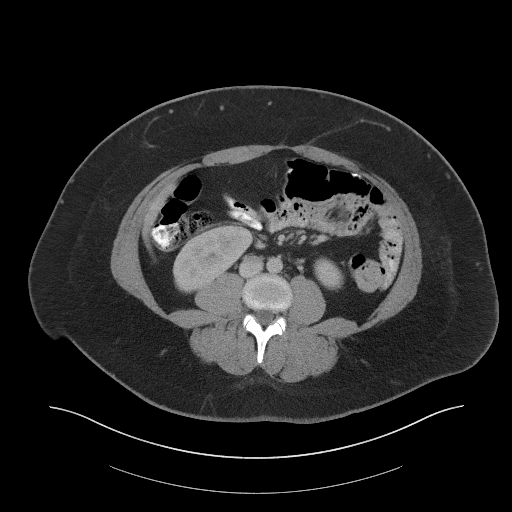
[im 53/90  soft-tissue]
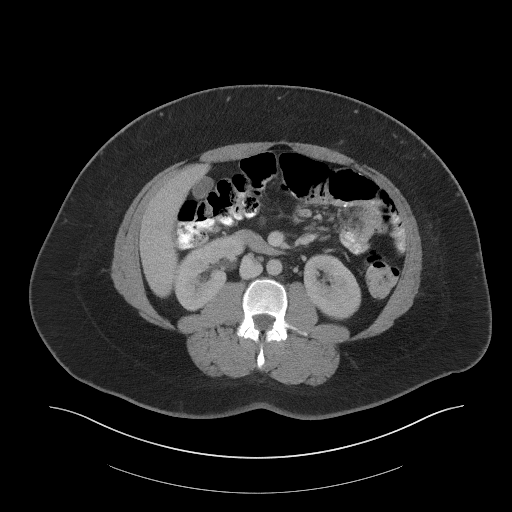
[im 53/90  bone]
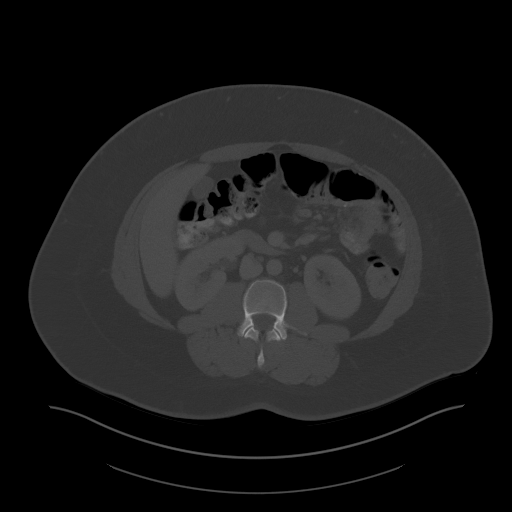
[im 58/90  soft-tissue]
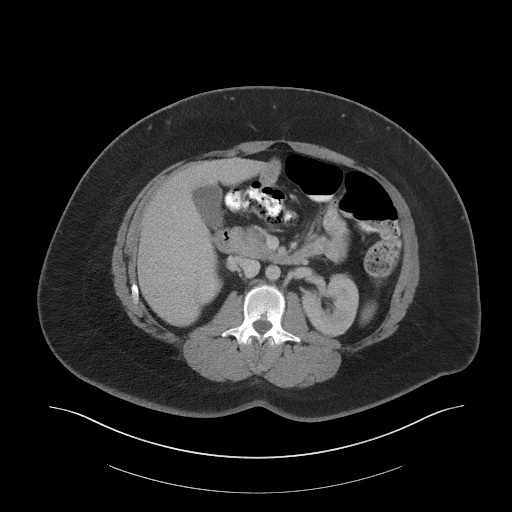
[im 69/90  soft-tissue]
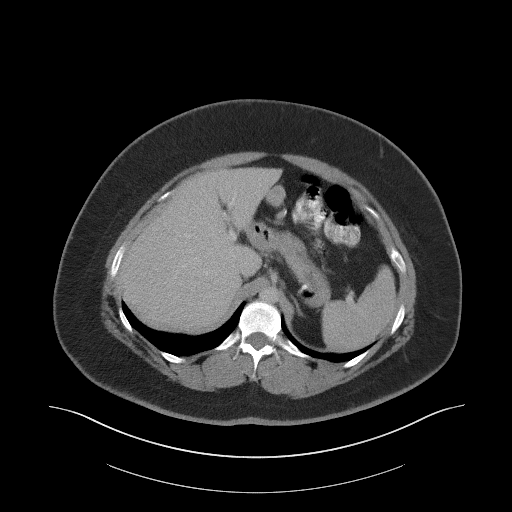
[im 74/90  soft-tissue]
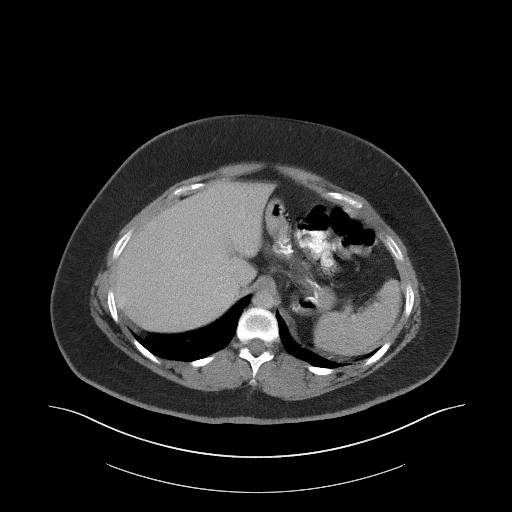
[im 79/90  soft-tissue]
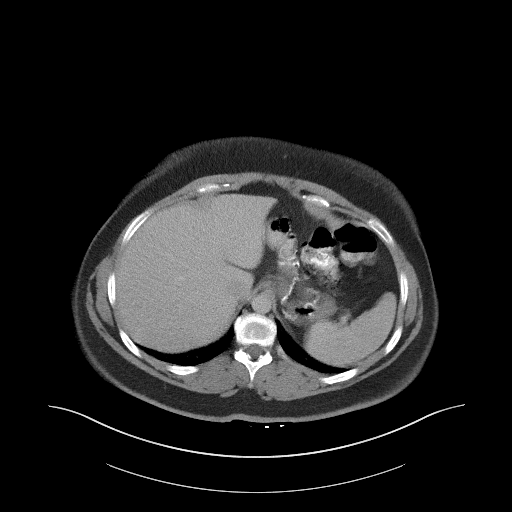
[im 84/90  soft-tissue]
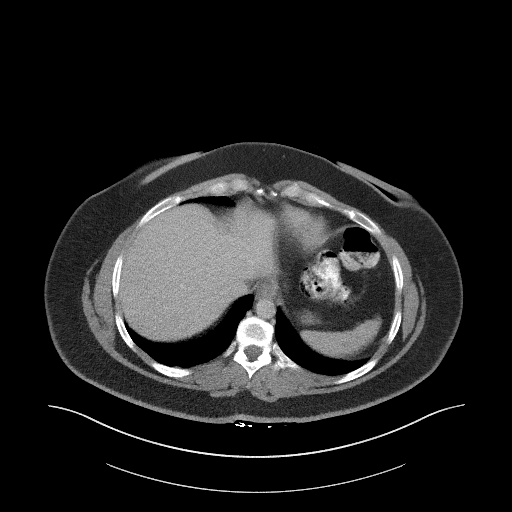

[Series 7: coronal st · coronal · 0.90mm/px · 3 of 102 slices shown]
[im 34/102  soft-tissue]
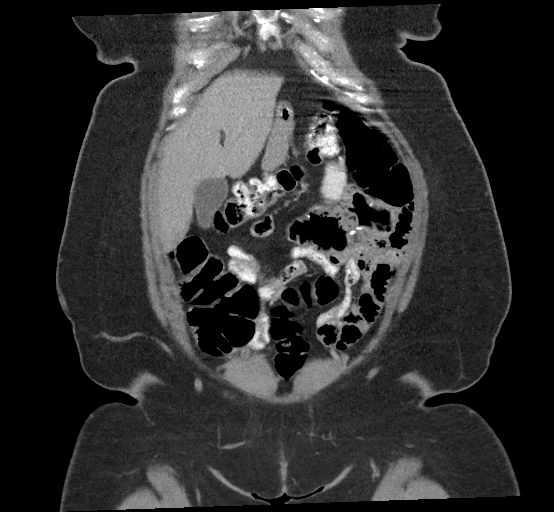
[im 45/102  soft-tissue]
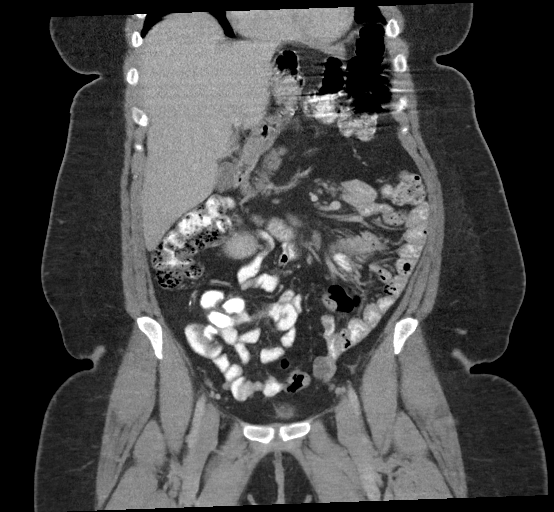
[im 57/102  soft-tissue]
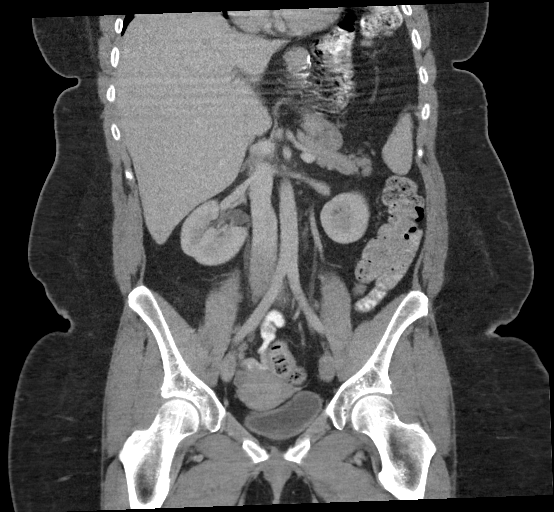

[17 of 46 positions shown; findings below may reference images not displayed]

FINDINGS: Lower chest: No acute pleural or parenchymal lung disease.

Hepatobiliary: 1.4 cm subcapsular cyst posterior right lobe liver.
The remainder of the liver is unremarkable. Gallbladder is grossly
normal. No biliary duct dilation.

Pancreas: Unremarkable. No pancreatic ductal dilatation or
surrounding inflammatory changes.

Spleen: Normal in size without focal abnormality.

Adrenals/Urinary Tract: Adrenal glands are unremarkable. Kidneys are
normal, without renal calculi, focal lesion, or hydronephrosis.
Bladder is unremarkable.

Stomach/Bowel: No bowel obstruction or ileus. The appendix is
surgically absent. No bowel wall thickening or inflammatory change.
Postsurgical changes from previous bariatric surgery.

Vascular/Lymphatic: No significant vascular findings are present. No
enlarged abdominal or pelvic lymph nodes.

Reproductive: 2 cm low-attenuation structure within the uterine
fundus consistent with intramural fibroid. No adnexal masses.

Other: No free fluid or free gas. Small fat containing umbilical
hernia. No bowel herniation.

Musculoskeletal: No acute or destructive bony lesions. Reconstructed
images demonstrate no additional findings.
IMPRESSION: 1. No acute intra-abdominal or intrapelvic process.
2. Uterine fibroid.
3. Small fat containing umbilical hernia.

## 2020-12-21 MED ORDER — IOHEXOL 300 MG/ML  SOLN
100.0000 mL | Freq: Once | INTRAMUSCULAR | Status: AC | PRN
  Administered 2020-12-21: 100 mL via INTRAVENOUS

## 2020-12-21 NOTE — Telephone Encounter (Signed)
I am happy to order a CT scan, but it is Friday afternoon and I do not believe I can get a stat CT ordered this late.  I think she needs evaluation by GI for endoscopy, potentially colonoscopy ASAP, this will be more helpful in finding the root cause. Labs do not show systemic infection.  Fortunately her labs are not in the critical range, but she needs to be seen soon.  If you can get [facility] on the phone I would be happy to talk with them.

## 2020-12-21 NOTE — Telephone Encounter (Signed)
Patient called this morning stating she thought more about her symptoms and spoke with her mom about it, she feels that her [facility] provider that did her bariatric surgery would be the one to really help her more, she rather go to [facility]. Her provider there is Dr Cammie Sickle. She called their office this morning and spoke with his nurse Lattie Haw about her symptoms and they were worried that she was having internal bleeding. Lattie Haw was going to speak with Dr Margart Sickles about patient and to try and get her in sooner than next Thursday. Lattie Haw wanted to see if Anda Kraft thought maybe patient needed colonoscopy or endoscopy. I advised patient that it looks like we are waiting on lab results to know the next steps besides at this point getting with GI for evaluation. Lattie Haw is suppose to call patient back with feedback from Dr Margart Sickles. Lisa's phone number if need to call or discuss anything is [phone].  Patient states no new symptoms but is having abdominal pain all over her stomach and dizziness with certain movements, like getting up fast. She is feeling very anxious. I advised patient that I would send note to Anda Kraft to get any other feedback. I asked patient to call me back if she hears anything from [place] so we know the update on this.

## 2020-12-21 NOTE — Telephone Encounter (Signed)
Called [facility], Lattie Haw, twice without answer.  Voicemail left.  Call patient and spoke with her, we decided that ultimately she needs an endoscopy and colonoscopy due to symptoms, but we will proceed with CT abdomen pelvis given her symptoms  Orders placed. Stat referral placed to [facility].  Helyn App, patient prefers to go through [facility], please cancel appointment for [facility].

## 2020-12-21 NOTE — Telephone Encounter (Signed)
Left message to return call to our office.  

## 2020-12-21 NOTE — Telephone Encounter (Signed)
Please notify patient that I have no preference on who she sees, but she needs to see somebody as I suspect she has a GI bleed.  Her labs show anemia, but not profound at this point.  This may worsen so she needs to get some follow-up quickly.  She most certainly needs a scope, probably both endoscopy and colonoscopy, this is why I referred her to GI.  Any update on getting her in with someone from [facility]? She has an appointment with GI through [facility] scheduled for next week.

## 2020-12-21 NOTE — Telephone Encounter (Signed)
Patient is wanting a call back / She is asking for a call to [facility] to discuss what Anda Kraft is suspecting and they are willing to expedite her to be seen. Their # is [phone] and ask for Lattie Haw. They are asking if she needs a Cat scan as well before her procedure. EM

## 2020-12-23 DIAGNOSIS — G43009 Migraine without aura, not intractable, without status migrainosus: Secondary | ICD-10-CM

## 2020-12-24 LAB — HELICOBACTER PYLORI  SPECIAL ANTIGEN
MICRO NUMBER:: 11693503
SPECIMEN QUALITY: ADEQUATE

## 2020-12-24 NOTE — Telephone Encounter (Signed)
I called Lattie Haw today also to try and speak with her in regards to the patient and make sure referral has all that they need. I was not able to get Lattie Haw on the phone. I went ahead and faxed referral over and the notes/labs/CT.  I spoke with patient and she said she heard from [place] and was told that she tried to call us back but could not get through-not sure what number was left for her. Lattie Haw told patient that she spoke with Dr Margart Sickles and he was comfortable with doing Endoscopy/Colonoscopy on the patient just needs orders. Advised patient that referral to Dr Ciro Backer was placed but for the symptoms and usually the GI provider would see the patient and discuss what testing needs to be done and order them, especially with patient been under 57. I advised patient I would ask Anda Kraft if she wanted to update referral or orders or to leave it to Dr Margart Sickles to evaluate patient and order what is needed from that point on.  Also patient did send a mychart message earlier in regards to questions she had. Thank you

## 2020-12-24 NOTE — Telephone Encounter (Signed)
Noted. Will await for any further feedback. Patient states she wants to keep Appointment with [place] office for now in case she can not see Dr Margart Sickles soon.

## 2020-12-24 NOTE — Telephone Encounter (Signed)
Covering for Allie Bossier while out of the office.   I suspect Anda Kraft would prefer for the patient to be seen by GI and necessary testing to be performed.   Also, it does appear she has an appoint schedule with  GI on 3/31 -- this may need to be canceled if she is planning to see Dr. Ciro Backer.   Pleas Koch, NP will also review this on her return

## 2020-12-25 MED ORDER — PROPRANOLOL HCL ER 80 MG PO CP24: 80.0000 mg | ORAL_CAPSULE | Freq: Every day | ORAL | 0 refills | Status: DC

## 2020-12-25 NOTE — Telephone Encounter (Signed)
Spoke with patient regarding concerns, agreed to provide work note to work from home.  I do not recommend she drives or is active for prolonged periods of time until see by GI.  Will attach to my chart portal.   Also discussed to start ferrous sulfate 325 mg and that she could purchase this OTC.  During her conversation she also mentioned that topiramate was causing too much drowsiness so she would like to discontinue.  We discussed a trial of propanolol ER 80 mg daily for migraine prevention, she agreed to try.  Prescription for propanolol ER 80 mg sent to pharmacy.  She will update in a few weeks. I did recommend that she visit with GI first before starting.

## 2020-12-25 NOTE — Telephone Encounter (Signed)
Spoke with Jean Pitts about this message. Routing to [place] if she has time to call if not she will send to [facility] to follow up. Thank you

## 2020-12-25 NOTE — Telephone Encounter (Signed)
Left message to return call to our office.  

## 2020-12-25 NOTE — Telephone Encounter (Signed)
I recommend patient see whomever can get her in the soonest letter that be [place] [facility] as scheduled, or Dr. Margart Sickles.  Agree the patient should keep [facility] appointment.

## 2020-12-25 NOTE — Telephone Encounter (Signed)
Spoke with patient regarding concerns, see other MyChart message.

## 2020-12-26 ENCOUNTER — Other Ambulatory Visit: Payer: BLUE CROSS/BLUE SHIELD

## 2020-12-26 ENCOUNTER — Other Ambulatory Visit: Payer: Self-pay | Admitting: Internal Medicine

## 2020-12-26 DIAGNOSIS — Z0000 Encounter for general adult medical examination without abnormal findings: Secondary | ICD-10-CM

## 2020-12-27 ENCOUNTER — Ambulatory Visit: Payer: BLUE CROSS/BLUE SHIELD | Admitting: Gastroenterology

## 2020-12-27 ENCOUNTER — Other Ambulatory Visit: Payer: Self-pay

## 2020-12-27 ENCOUNTER — Encounter: Payer: Self-pay | Admitting: Gastroenterology

## 2020-12-27 ENCOUNTER — Other Ambulatory Visit: Payer: Self-pay | Admitting: Gastroenterology

## 2020-12-27 VITALS — BP 129/80 | HR 80 | Temp 98.1°F | Ht 63.5 in | Wt 239.0 lb

## 2020-12-27 DIAGNOSIS — K921 Melena: Secondary | ICD-10-CM

## 2020-12-27 DIAGNOSIS — D508 Other iron deficiency anemias: Secondary | ICD-10-CM | POA: Diagnosis not present

## 2020-12-27 NOTE — Progress Notes (Addendum)
Jean Pitts [address]  [place]  [place], [place] [address]  Main: [phone]  Fax: [phone]   Gastroenterology Consultation  Referring Provider:     Pleas Koch, NP Primary Care Physician:  Pleas Koch, NP Reason for Consultation:     Abdominal pain        HPI:    Chief Complaint  Patient presents with  . Abdominal Pain    Jean Pitts is a 35 y.o. y/o female referred for consultation & management  by Dr. Carlis Abbott, Leticia Penna, NP.  Patient reports 1 to 2-week history of abdominal pain, accompanied with melena.  Patient took a picture of her stool] with her today.  Reviewed the picture and it is that of a black stool.  Patient denies any NSAID use.  Hemoglobin checked 7 days ago by PCP showed a drop in hemoglobin to 10.5, compared to 12.7 at baseline.  No hematemesis.  Patient has been placed on tonics 40 mg twice daily by PCP and has been taking it for 1 week.  She states her bowel movement yesterday was brown, but was again black today.  FOBT was positive.  Patient denies any previous history of EGD, colonoscopy, GI bleed.  No family history of colon cancer.  Past Medical History:  Diagnosis Date  . B12 deficiency   . Family history of adverse reaction to anesthesia    MATERNAL AUNT-PT UNAWARE OF WHAT HAPPENED WITH ANESTHESIA BUT STATES HER AUNT HAS HAD PROBLEMS IN PAST   . Genital herpes   . Headache    MIGRAINE  . Hyperlipidemia   . Hypertension   . Migraine   . Obesity   . PONV (postoperative nausea and vomiting)   . Syncope 04-2015  . Vitamin D deficiency     Past Surgical History:  Procedure Laterality Date  . APPENDECTOMY    . BUNIONECTOMY    . CESAREAN SECTION     X2  . DIAGNOSTIC LAPAROSCOPY    . ENDOMETRIAL BIOPSY    . GASTRIC BYPASS    . LASIK    . MOUTH SURGERY     Tongue Cyst  . NOVASURE ABLATION N/A 06/29/2015   Procedure: DILATION AND CURETTAGE, HYSTEROSCOPY, NOVASURE ABLATION;  Surgeon: Malachy Mood, MD;   Location: [facility] ORS;  Service: Gynecology;  Laterality: N/A;  . TUBAL LIGATION    . WISDOM TOOTH EXTRACTION      Prior to Admission medications   Medication Sig Start Date End Date Taking? Authorizing Provider  pantoprazole (PROTONIX) 40 MG tablet Take 1 tablet (40 mg total) by mouth 2 (two) times daily before a meal. For GI bleed. 12/20/20  Yes Pleas Koch, NP  fluconazole (DIFLUCAN) 150 MG tablet Take 1 tablet (150 mg total) by mouth once a week. As preventive Patient not taking: Reported on 12/27/2020 12/30/45   Copland, Elmo Putt B, PA-C  propranolol ER (INDERAL LA) 80 MG 24 hr capsule Take 1 capsule (80 mg total) by mouth daily. For headache prevention. Patient not taking: Reported on 12/27/2020 12/25/20   Pleas Koch, NP  SUMAtriptan Succinate Refill 6 MG/0.5ML SOCT Inject into the skin. Patient not taking: Reported on 12/27/2020    [provider]  topiramate (TOPAMAX) 100 MG tablet Take 1 tablet by mouth 2 (two) times daily. Patient not taking: Reported on 12/27/2020 06/21/12   [provider]    Family History  Problem Relation Age of Onset  . Obesity Mother   . Hyperlipidemia Mother   .  Obesity Sister   . Obesity Brother   . Obesity Sister   . Ovarian cancer Maternal Grandmother 26       Cervical/Sqammacell     Social History   Tobacco Use  . Smoking status: Never Smoker  . Smokeless tobacco: Never Used  Vaping Use  . Vaping Use: Never used  Substance Use Topics  . Alcohol use: Yes    Comment: RARE  . Drug use: No    Allergies as of 12/27/2020 - Review Complete 12/27/2020  Allergen Reaction Noted  . Vicodin [hydrocodone-acetaminophen] Nausea And Vomiting 06/26/2015  . Codeine Other (See Comments) 04/12/2014  . Hydrocodone-acetaminophen Nausea And Vomiting 06/26/2015    Review of Systems:    All systems reviewed and negative except where noted in HPI.   Physical Exam:  BP 129/80   Pulse 80   Temp 98.1 F (36.7 C) (Oral)   Ht 5'  3.5" (1.613 m)   Wt 239 lb (108.4 kg)   BMI 41.67 kg/m  No LMP recorded. Patient has had an ablation. Psych:  Alert and cooperative. Normal mood and affect. General:   Alert,  Well-developed, well-nourished, pleasant and cooperative in NAD Head:  Normocephalic and atraumatic. Eyes:  Sclera clear, no icterus.   Conjunctiva pink. Ears:  Normal auditory acuity. Nose:  No deformity, discharge, or lesions. Mouth:  No deformity or lesions,oropharynx pink & moist. Neck:  Supple; no masses or thyromegaly. Abdomen:  Normal bowel sounds.  No bruits.  Soft, non-tender and non-distended without masses, hepatosplenomegaly or hernias noted.  No guarding or rebound tenderness.    Msk:  Symmetrical without gross deformities. Good, equal movement & strength bilaterally. Pulses:  Normal pulses noted. Extremities:  No clubbing or edema.  No cyanosis. Neurologic:  Alert and oriented x3;  grossly normal neurologically. Skin:  Intact without significant lesions or rashes. No jaundice. Lymph Nodes:  No significant cervical adenopathy. Psych:  Alert and cooperative. Normal mood and affect.   Labs: CBC    Component Value Date/Time   WBC 7.3 12/20/2020 1645   RBC 3.48 (L) 12/20/2020 1645   HGB 10.5 (L) 12/20/2020 1645   HGB 12.7 06/25/2020 1521   HCT 30.6 (L) 12/20/2020 1645   HCT 38.1 06/25/2020 1521   PLT 332.0 12/20/2020 1645   PLT 390 06/25/2020 1521   MCV 87.8 12/20/2020 1645   MCV 89 06/25/2020 1521   MCH 29.8 06/25/2020 1521   MCHC 34.3 12/20/2020 1645   RDW 14.3 12/20/2020 1645   RDW 12.4 06/25/2020 1521   LYMPHSABS 2.1 12/20/2020 1645   LYMPHSABS 2.4 06/25/2020 1521   MONOABS 0.3 12/20/2020 1645   EOSABS 0.1 12/20/2020 1645   EOSABS 0.2 06/25/2020 1521   BASOSABS 0.1 12/20/2020 1645   BASOSABS 0.1 06/25/2020 1521   CMP     Component Value Date/Time   NA 140 12/20/2020 1645   NA 140 06/25/2020 1521   K 5.0 12/20/2020 1645   CL 107 12/20/2020 1645   CO2 25 12/20/2020 1645    GLUCOSE 92 12/20/2020 1645   BUN 20 12/20/2020 1645   BUN 8 06/25/2020 1521   CREATININE 0.75 12/20/2020 1645   CALCIUM 9.1 12/20/2020 1645   PROT 6.9 12/20/2020 1645   PROT 7.0 06/25/2020 1521   ALBUMIN 4.3 12/20/2020 1645   ALBUMIN 4.3 06/25/2020 1521   AST 18 12/20/2020 1645   ALT 14 12/20/2020 1645   ALKPHOS 73 12/20/2020 1645   BILITOT 0.2 12/20/2020 1645   BILITOT 0.2 06/25/2020 1521  GFRNONAA 103 06/25/2020 1521   GFRAA 118 06/25/2020 1521    Imaging Studies: CT Abdomen Pelvis W Contrast  Result Date: 12/21/2020 CLINICAL DATA:  Dark tarry stools, hemoccult positive, generalized abdominal cramping EXAM: CT ABDOMEN AND PELVIS WITH CONTRAST TECHNIQUE: Multidetector CT imaging of the abdomen and pelvis was performed using the standard protocol following bolus administration of intravenous contrast. CONTRAST:  123mL OMNIPAQUE IOHEXOL 300 MG/ML  SOLN COMPARISON:  None. FINDINGS: Lower chest: No acute pleural or parenchymal lung disease. Hepatobiliary: 1.4 cm subcapsular cyst posterior right lobe liver. The remainder of the liver is unremarkable. Gallbladder is grossly normal. No biliary duct dilation. Pancreas: Unremarkable. No pancreatic ductal dilatation or surrounding inflammatory changes. Spleen: Normal in size without focal abnormality. Adrenals/Urinary Tract: Adrenal glands are unremarkable. Kidneys are normal, without renal calculi, focal lesion, or hydronephrosis. Bladder is unremarkable. Stomach/Bowel: No bowel obstruction or ileus. The appendix is surgically absent. No bowel wall thickening or inflammatory change. Postsurgical changes from previous bariatric surgery. Vascular/Lymphatic: No significant vascular findings are present. No enlarged abdominal or pelvic lymph nodes. Reproductive: 2 cm low-attenuation structure within the uterine fundus consistent with intramural fibroid. No adnexal masses. Other: No free fluid or free gas. Small fat containing umbilical hernia. No bowel  herniation. Musculoskeletal: No acute or destructive bony lesions. Reconstructed images demonstrate no additional findings. IMPRESSION: 1. No acute intra-abdominal or intrapelvic process. 2. Uterine fibroid. 3. Small fat containing umbilical hernia. Electronically Signed   By: Randa Ngo M.D.   On: 12/21/2020 20:19    Assessment and Plan:   Jean Pitts is a 35 y.o. y/o female has been referred for abdominal pain, melena, anemia  I will repeat stat hemoglobin today and if it is continuing to drop significantly, patient will need an urgent endoscopy.  Currently, as per protocol, Covid testing prior to endoscopy is being required for the procedure.  Today is thursday, tomorrow is Friday.  The Covid testing availability for procedures being done tomorrow, closed at 10 AM today, and it is now 3 PM this afternoon.  If hemoglobin is continuing to drop to where she is in need of a transfusion, we may have to have the patient admitted to be able to do an urgent upper endoscopy for melena and anemia.  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  However, she understands that if hemoglobin continues to drop significantly, we may need to perform upper endoscopy more quickly tomorrow, instead of being able to do a colonoscopy prep for colonoscopy next week   Dr Jean Pitts  Speech recognition software was used to dictate the above note.

## 2020-12-27 NOTE — Progress Notes (Signed)
I called the patient just now at 10 pm. She has not had any further bowel movements since the one this morning which was melanotic. She denies any dizziness, weakness, nausea vomiting, hematemesis. I informed her that we still do not have the lab results from her blood work today even though it was ordered STAT. I have advised her to go to the ED if she has any melena overnight or any N/V, abdominal pain, dizziness, weakness or concerni ng symptoms. I have advised her to remain NPO after midnight except meds with sips of water and we would call her again tomorrow morning and discuss further steps. She verbalized understanding. I have informed the [facility] on call provider tonight about the patient as well

## 2020-12-28 ENCOUNTER — Other Ambulatory Visit: Payer: Self-pay

## 2020-12-28 ENCOUNTER — Other Ambulatory Visit: Payer: Self-pay | Admitting: Gastroenterology

## 2020-12-28 ENCOUNTER — Other Ambulatory Visit
Admission: RE | Admit: 2020-12-28 | Discharge: 2020-12-28 | Disposition: A | Discharge status: Home / Self Care | Payer: BLUE CROSS/BLUE SHIELD | Source: Ambulatory Visit | Attending: Gastroenterology | Admitting: Gastroenterology

## 2020-12-28 ENCOUNTER — Encounter: Payer: Self-pay | Admitting: Gastroenterology

## 2020-12-28 ENCOUNTER — Telehealth: Payer: Self-pay | Admitting: Gastroenterology

## 2020-12-28 DIAGNOSIS — Z9884 Bariatric surgery status: Secondary | ICD-10-CM | POA: Diagnosis not present

## 2020-12-28 DIAGNOSIS — Z01812 Encounter for preprocedural laboratory examination: Secondary | ICD-10-CM | POA: Insufficient documentation

## 2020-12-28 DIAGNOSIS — Z980 Intestinal bypass and anastomosis status: Secondary | ICD-10-CM | POA: Diagnosis not present

## 2020-12-28 DIAGNOSIS — K921 Melena: Secondary | ICD-10-CM

## 2020-12-28 DIAGNOSIS — D509 Iron deficiency anemia, unspecified: Secondary | ICD-10-CM | POA: Diagnosis not present

## 2020-12-28 DIAGNOSIS — Z8349 Family history of other endocrine, nutritional and metabolic diseases: Secondary | ICD-10-CM | POA: Diagnosis not present

## 2020-12-28 DIAGNOSIS — R195 Other fecal abnormalities: Secondary | ICD-10-CM | POA: Diagnosis not present

## 2020-12-28 DIAGNOSIS — Z20822 Contact with and (suspected) exposure to covid-19: Secondary | ICD-10-CM | POA: Insufficient documentation

## 2020-12-28 DIAGNOSIS — D508 Other iron deficiency anemias: Secondary | ICD-10-CM

## 2020-12-28 DIAGNOSIS — I10 Essential (primary) hypertension: Secondary | ICD-10-CM | POA: Diagnosis not present

## 2020-12-28 DIAGNOSIS — D122 Benign neoplasm of ascending colon: Secondary | ICD-10-CM | POA: Diagnosis not present

## 2020-12-28 DIAGNOSIS — Z79899 Other long term (current) drug therapy: Secondary | ICD-10-CM | POA: Diagnosis not present

## 2020-12-28 DIAGNOSIS — Z885 Allergy status to narcotic agent status: Secondary | ICD-10-CM | POA: Diagnosis not present

## 2020-12-28 LAB — CBC
Hematocrit: 28.8 % — ABNORMAL LOW (ref 34.0–46.6)
Hemoglobin: 9.5 g/dL — ABNORMAL LOW (ref 11.1–15.9)
MCH: 30.2 pg (ref 26.6–33.0)
MCHC: 33 g/dL (ref 31.5–35.7)
MCV: 91 fL (ref 79–97)
Platelets: 391 10*3/uL (ref 150–450)
RBC: 3.15 x10E6/uL — ABNORMAL LOW (ref 3.77–5.28)
RDW: 13.6 % (ref 11.7–15.4)
WBC: 6.4 10*3/uL (ref 3.4–10.8)

## 2020-12-28 LAB — SPECIMEN STATUS REPORT

## 2020-12-28 MED ORDER — SUCRALFATE 1 GM/10ML PO SUSP: 1.0000 g | Freq: Four times a day (QID) | ORAL | 0 refills | Status: DC

## 2020-12-28 MED ORDER — PEG 3350-KCL-NA BICARB-NACL 420 G PO SOLR: ORAL | 0 refills | Status: DC

## 2020-12-28 NOTE — Telephone Encounter (Signed)
I called the patient again this morning and she states she has not had any further melena. She has not had a BM since yesterday morning before being seen in clinic. She denies N/V, hematochezia, melena. She feels well except for a headache that she states her PCP has prescribed migraine medication for.   She is continuing to take PPI BID  Her Hgb level was finally obtained this morning after we called [facility] and it is 9.5.   Therefore, she is not in need for inpatient admission or blood transfusion based on clinical picture and hemodynamic stability and no further melena for 24 hrs.   Pt is available to do the procedure Monday with pre-procedure COVID testing to be done today.   Given her history of RYGB and possibility of anastomosis ulcer, I will also start sucralfate 4 times a day. I have asked her to start this today. But to hold the last two doses of the day on Sunday with her procedure being Monday. She verbalized understanding.  I have advised her to call us back at the on-call GI service line if her clinical status changes, or if she has any new symptoms. However, if things acutely worsen I have advised her to go to the ER.  Her symptoms have been ongoing for a few weeks and she is relatively stable at this time.

## 2020-12-28 NOTE — Discharge Instructions (Signed)

## 2020-12-29 LAB — SARS CORONAVIRUS 2 (TAT 6-24 HRS): SARS Coronavirus 2: NEGATIVE

## 2020-12-31 ENCOUNTER — Encounter
Admission: RE | Disposition: A | Discharge status: Home / Self Care | Payer: Self-pay | Source: Home / Self Care | Attending: Gastroenterology

## 2020-12-31 ENCOUNTER — Encounter: Payer: Self-pay | Admitting: Gastroenterology

## 2020-12-31 ENCOUNTER — Ambulatory Visit
Admission: RE | Admit: 2020-12-31 | Discharge: 2020-12-31 | Disposition: A | Discharge status: Home / Self Care | Payer: BLUE CROSS/BLUE SHIELD | Attending: Gastroenterology | Admitting: Gastroenterology

## 2020-12-31 ENCOUNTER — Ambulatory Visit: Payer: BLUE CROSS/BLUE SHIELD | Admitting: Anesthesiology

## 2020-12-31 ENCOUNTER — Other Ambulatory Visit: Payer: Self-pay | Admitting: Obstetrics and Gynecology

## 2020-12-31 ENCOUNTER — Encounter: Payer: Self-pay | Admitting: Obstetrics and Gynecology

## 2020-12-31 ENCOUNTER — Other Ambulatory Visit: Payer: Self-pay

## 2020-12-31 DIAGNOSIS — I10 Essential (primary) hypertension: Secondary | ICD-10-CM | POA: Insufficient documentation

## 2020-12-31 DIAGNOSIS — K635 Polyp of colon: Secondary | ICD-10-CM | POA: Diagnosis not present

## 2020-12-31 DIAGNOSIS — Z79899 Other long term (current) drug therapy: Secondary | ICD-10-CM | POA: Insufficient documentation

## 2020-12-31 DIAGNOSIS — Z980 Intestinal bypass and anastomosis status: Secondary | ICD-10-CM | POA: Insufficient documentation

## 2020-12-31 DIAGNOSIS — Z885 Allergy status to narcotic agent status: Secondary | ICD-10-CM | POA: Insufficient documentation

## 2020-12-31 DIAGNOSIS — R195 Other fecal abnormalities: Secondary | ICD-10-CM

## 2020-12-31 DIAGNOSIS — D122 Benign neoplasm of ascending colon: Secondary | ICD-10-CM | POA: Insufficient documentation

## 2020-12-31 DIAGNOSIS — Z9884 Bariatric surgery status: Secondary | ICD-10-CM | POA: Insufficient documentation

## 2020-12-31 DIAGNOSIS — K289 Gastrojejunal ulcer, unspecified as acute or chronic, without hemorrhage or perforation: Secondary | ICD-10-CM | POA: Diagnosis not present

## 2020-12-31 DIAGNOSIS — D649 Anemia, unspecified: Secondary | ICD-10-CM

## 2020-12-31 DIAGNOSIS — K921 Melena: Secondary | ICD-10-CM

## 2020-12-31 DIAGNOSIS — Z20822 Contact with and (suspected) exposure to covid-19: Secondary | ICD-10-CM | POA: Insufficient documentation

## 2020-12-31 DIAGNOSIS — D508 Other iron deficiency anemias: Secondary | ICD-10-CM

## 2020-12-31 DIAGNOSIS — Z8349 Family history of other endocrine, nutritional and metabolic diseases: Secondary | ICD-10-CM | POA: Insufficient documentation

## 2020-12-31 DIAGNOSIS — D509 Iron deficiency anemia, unspecified: Secondary | ICD-10-CM | POA: Insufficient documentation

## 2020-12-31 HISTORY — PX: COLONOSCOPY WITH PROPOFOL: SHX5780

## 2020-12-31 HISTORY — PX: ESOPHAGOGASTRODUODENOSCOPY (EGD) WITH PROPOFOL: SHX5813

## 2020-12-31 SURGERY — ESOPHAGOGASTRODUODENOSCOPY (EGD) WITH PROPOFOL
Anesthesia: General

## 2020-12-31 MED ORDER — LIDOCAINE HCL (CARDIAC) PF 100 MG/5ML IV SOSY
PREFILLED_SYRINGE | INTRAVENOUS | Status: DC | PRN
  Administered 2020-12-31: 100 mg via INTRAVENOUS

## 2020-12-31 MED ORDER — LACTATED RINGERS IV SOLN: INTRAVENOUS | Status: DC | PRN

## 2020-12-31 MED ORDER — PROPOFOL 10 MG/ML IV BOLUS
INTRAVENOUS | Status: DC | PRN
  Administered 2020-12-31 (×3): 50 mg via INTRAVENOUS
  Administered 2020-12-31: 20 mg via INTRAVENOUS
  Administered 2020-12-31 (×2): 50 mg via INTRAVENOUS
  Administered 2020-12-31: 100 mg via INTRAVENOUS
  Administered 2020-12-31 (×2): 50 mg via INTRAVENOUS
  Administered 2020-12-31: 20 mg via INTRAVENOUS

## 2020-12-31 MED ORDER — FLUCONAZOLE 150 MG PO TABS: 150.0000 mg | ORAL_TABLET | ORAL | 0 refills | Status: DC

## 2020-12-31 MED ORDER — SODIUM CHLORIDE 0.9 % IV SOLN: INTRAVENOUS | Status: DC

## 2020-12-31 MED ORDER — ACETAMINOPHEN 10 MG/ML IV SOLN
1000.0000 mg | Freq: Once | INTRAVENOUS | Status: AC
  Administered 2020-12-31: 1000 mg via INTRAVENOUS

## 2020-12-31 MED ORDER — GLYCOPYRROLATE 0.2 MG/ML IJ SOLN
INTRAMUSCULAR | Status: DC | PRN
  Administered 2020-12-31: .1 mg via INTRAVENOUS

## 2020-12-31 MED ORDER — STERILE WATER FOR IRRIGATION IR SOLN
Status: DC | PRN
  Administered 2020-12-31: 150 mL

## 2020-12-31 SURGICAL SUPPLY — 43 items
BALLN DILATOR 10-12 8 (BALLOONS)
BALLN DILATOR 12-15 8 (BALLOONS)
BALLN DILATOR 15-18 8 (BALLOONS)
BALLN DILATOR CRE 0-12 8 (BALLOONS)
BALLN DILATOR ESOPH 8 10 CRE (MISCELLANEOUS) IMPLANT
BALLOON DILATOR 12-15 8 (BALLOONS) IMPLANT
BALLOON DILATOR 15-18 8 (BALLOONS) IMPLANT
BALLOON DILATOR CRE 0-12 8 (BALLOONS) IMPLANT
BLOCK BITE 60FR ADLT L/F GRN (MISCELLANEOUS) ×2 IMPLANT
CLIP HMST 235XBRD CATH ROT (MISCELLANEOUS) ×1 IMPLANT
CLIP RESOLUTION 360 11X235 (MISCELLANEOUS) ×2
ELECT REM PT RETURN 9FT ADLT (ELECTROSURGICAL)
ELECTRODE REM PT RTRN 9FT ADLT (ELECTROSURGICAL) IMPLANT
ELEVIEW  INJECTABLE COMP 10 (MISCELLANEOUS) ×1
FCP ESCP3.2XJMB 240X2.8X (MISCELLANEOUS)
FORCEPS BIOP RAD 4 LRG CAP 4 (CUTTING FORCEPS) ×2 IMPLANT
FORCEPS BIOP RJ4 240 W/NDL (MISCELLANEOUS)
FORCEPS ESCP3.2XJMB 240X2.8X (MISCELLANEOUS) IMPLANT
GOWN CVR UNV OPN BCK APRN NK (MISCELLANEOUS) ×2 IMPLANT
GOWN ISOL THUMB LOOP REG UNIV (MISCELLANEOUS) ×4
INJECTABLE ELEVIEW COMP 10 (MISCELLANEOUS) ×1 IMPLANT
INJECTOR VARIJECT VIN23 (MISCELLANEOUS) IMPLANT
KIT DEFENDO VALVE AND CONN (KITS) IMPLANT
KIT PRC NS LF DISP ENDO (KITS) ×1 IMPLANT
KIT PROCEDURE OLYMPUS (KITS) ×2
MANIFOLD NEPTUNE II (INSTRUMENTS) ×2 IMPLANT
MARKER SPOT ENDO TATTOO 5ML (MISCELLANEOUS) ×1 IMPLANT
NEEDLE CARR LOCKE SCLERO (NEEDLE) ×2 IMPLANT
PROBE APC STR FIRE (PROBE) IMPLANT
RETRIEVER NET PLAT FOOD (MISCELLANEOUS) IMPLANT
RETRIEVER NET ROTH 2.5X230 LF (MISCELLANEOUS) IMPLANT
SNARE COLD EXACTO (MISCELLANEOUS) ×2 IMPLANT
SNARE LASSO HEX 3 IN 1 (INSTRUMENTS) ×2 IMPLANT
SNARE SHORT THROW 13M SML OVAL (MISCELLANEOUS) IMPLANT
SNARE SHORT THROW 30M LRG OVAL (MISCELLANEOUS) IMPLANT
SNARE SNG USE RND 15MM (INSTRUMENTS) IMPLANT
SPOT EX ENDOSCOPIC TATTOO (MISCELLANEOUS) ×1
SYR 10ML LL (SYRINGE) ×2 IMPLANT
SYR INFLATION 60ML (SYRINGE) IMPLANT
TRAP ETRAP POLY (MISCELLANEOUS) ×2 IMPLANT
VARIJECT INJECTOR VIN23 (MISCELLANEOUS)
WATER STERILE IRR 250ML POUR (IV SOLUTION) ×2 IMPLANT
WIRE CRE 18-20MM 8CM F G (MISCELLANEOUS) IMPLANT

## 2020-12-31 NOTE — Op Note (Signed)
[facility] Patient Name: Jean Pitts Procedure Date: 12/31/2020 12:35 PM MRN: [id number] Account #: [id number] Date of Birth: [birth year] Admit Type: Outpatient Age: 35 Room: [facility] OR ROOM 01 Gender: Female Note Status: Finalized Procedure:             Colonoscopy Indications:           Positive fecal immunochemical test Providers:             Admiral Marcucci B. Bonna Gains MD, MD Medicines:             Monitored Anesthesia Care Complications:         No immediate complications. Procedure:             Pre-Anesthesia Assessment:                        - ASA Grade Assessment: II - A patient with mild                         systemic disease.                        - Prior to the procedure, a History and Physical was                         performed, and patient medications, allergies and                         sensitivities were reviewed. The patient's tolerance                         of previous anesthesia was reviewed.                        - The risks and benefits of the procedure and the                         sedation options and risks were discussed with the                         patient. All questions were answered and informed                         consent was obtained.                        - Patient identification and proposed procedure were                         verified prior to the procedure by the physician, the                         nurse, the anesthesiologist, the anesthetist and the                         technician. The procedure was verified in the                         procedure room.  After obtaining informed consent, the colonoscope was                         passed under direct vision. Throughout the procedure,                         the patient's blood pressure, pulse, and oxygen                         saturations were monitored continuously. The was                         introduced through  the anus and advanced to the the                         cecum, identified by appendiceal orifice and ileocecal                         valve. The colonoscopy was performed with ease. The                         patient tolerated the procedure well. The quality of                         the bowel preparation was good. Findings:      The perianal and digital rectal examinations were normal.      A 15 mm polyp was found in the ascending colon. The polyp was flat.       Preparations were made for mucosal resection. NBI was done to mark the       borders of the lesion. Eleview was injected to raise the lesion. Forceps       and snare mucosal resection was performed. Resection and retrieval were       complete. Area was tattooed with an injection of Spot (carbon black).       Tattoo was placed due to piecemeal resection, for localization during       future procedures. To stop active bleeding, one hemostatic clip was       successfully placed. There was no bleeding at the end of the procedure.      The exam was otherwise without abnormality.      The rectum, sigmoid colon, descending colon, transverse colon, ascending       colon and cecum appeared normal.      The retroflexed view of the distal rectum and anal verge was normal and       showed no anal or rectal abnormalities. Impression:            - One 15 mm polyp in the ascending colon, removed with                         mucosal resection. Resected and retrieved. Tattooed.                         Clip was placed.                        - The examination was otherwise normal.                        -  The rectum, sigmoid colon, descending colon,                         transverse colon, ascending colon and cecum are normal.                        - The distal rectum and anal verge are normal on                         retroflexion view.                        - Mucosal resection was performed. Resection and                          retrieval were complete. Recommendation:        - Await pathology results.                        - Discharge patient to home (with escort).                        - Advance diet as tolerated.                        - Continue present medications.                        - Repeat colonoscopy date to be determined after                         pending pathology results are reviewed.                        - The findings and recommendations were discussed with                         the patient.                        - The findings and recommendations were discussed with                         the patient's family.                        - Return to primary care physician as previously                         scheduled. Procedure Code(s):     --- Professional ---                        [phone], Colonoscopy, flexible; with endoscopic mucosal                         resection Diagnosis Code(s):     --- Professional ---                        K63.5, Polyp of colon  R19.5, Other fecal abnormalities CPT copyright 2019 American Medical Association. All rights reserved. The codes documented in this report are preliminary and upon coder review may  be revised to meet current compliance requirements.  [name], MD Margretta Sidle B. Bonna Gains MD, MD 12/31/2020 1:49:48 PM This report has been signed electronically. Number of Addenda: 0 Note Initiated On: 12/31/2020 12:35 PM Scope Withdrawal Time: 0 hours 37 minutes 36 seconds  Total Procedure Duration: 0 hours 40 minutes 42 seconds  Estimated Blood Loss:  Estimated blood loss: none.      [facility]

## 2020-12-31 NOTE — Anesthesia Procedure Notes (Signed)
Procedure Name: MAC Date/Time: 12/31/2020 12:46 PM Performed by: Jeannene Patella, CRNA Pre-anesthesia Checklist: Patient identified, Emergency Drugs available, Suction available, Timeout performed and Patient being monitored Patient Re-evaluated:Patient Re-evaluated prior to induction Oxygen Delivery Method: Nasal cannula Placement Confirmation: positive ETCO2

## 2020-12-31 NOTE — H&P (Signed)
[name], MD [address], [place], [place], [place], [address] [address], [place], [place], [place], [address] Phone: [phone]  Fax: [phone]  Primary Care Physician:  Pleas Koch, NP   Pre-Procedure History & Physical: HPI:  Jean Pitts is a 35 y.o. female is here for a colonoscopy and EGD.   Past Medical History:  Diagnosis Date  . B12 deficiency   . Family history of adverse reaction to anesthesia    MATERNAL AUNT-PT UNAWARE OF WHAT HAPPENED WITH ANESTHESIA BUT STATES HER AUNT HAS HAD PROBLEMS IN PAST   . Genital herpes   . Headache    MIGRAINE  . Hyperlipidemia   . Hypertension   . Migraine   . Obesity   . PONV (postoperative nausea and vomiting)   . Syncope 04-2015  . Vitamin D deficiency     Past Surgical History:  Procedure Laterality Date  . APPENDECTOMY    . BUNIONECTOMY    . CESAREAN SECTION     X2  . DIAGNOSTIC LAPAROSCOPY    . ENDOMETRIAL BIOPSY    . GASTRIC BYPASS    . LASIK    . MOUTH SURGERY     Tongue Cyst  . NOVASURE ABLATION N/A 06/29/2015   Procedure: DILATION AND CURETTAGE, HYSTEROSCOPY, NOVASURE ABLATION;  Surgeon: Malachy Mood, MD;  Location: [facility] ORS;  Service: Gynecology;  Laterality: N/A;  . TUBAL LIGATION    . WISDOM TOOTH EXTRACTION      Prior to Admission medications   Medication Sig Start Date End Date Taking? Authorizing Provider  fluconazole (DIFLUCAN) 150 MG tablet Take 1 tablet (150 mg total) by mouth once a week. As preventive 0/2/72  Yes Copland, Elmo Putt B, PA-C  pantoprazole (PROTONIX) 40 MG tablet Take 1 tablet (40 mg total) by mouth 2 (two) times daily before a meal. For GI bleed. 12/20/20  Yes Pleas Koch, NP  propranolol ER (INDERAL LA) 80 MG 24 hr capsule Take 1 capsule (80 mg total) by mouth daily. For headache prevention. 12/25/20  Yes Pleas Koch, NP  sucralfate (CARAFATE) 1 GM/10ML suspension Take 10 mLs (1 g total) by mouth 4 (four) times daily. 12/28/20 01/27/21 Yes  Virgel Manifold, MD  polyethylene glycol-electrolytes (NULYTELY) 420 g solution Prepare according to package instructions. Starting at 5:00 PM: Drink one 8 oz glass of mixture every 15 minutes until you finish half of the jug. Five hours prior to procedure, drink 8 oz glass of mixture every 15 minutes until it is all gone. Make sure you do not drink anything 4 hours prior to your procedure. 12/28/20   Virgel Manifold, MD  SUMAtriptan Succinate Refill 6 MG/0.5ML SOCT Inject into the skin. Patient not taking: Reported on 12/27/2020    [provider]  topiramate (TOPAMAX) 100 MG tablet Take 1 tablet by mouth 2 (two) times daily. Patient not taking: Reported on 12/27/2020 06/21/12   [provider]    Allergies as of 12/28/2020 - Review Complete 12/28/2020  Allergen Reaction Noted  . Vicodin [hydrocodone-acetaminophen] Nausea And Vomiting 06/26/2015  . Codeine Other (See Comments) 04/12/2014  . Hydrocodone-acetaminophen Nausea And Vomiting 06/26/2015    Family History  Problem Relation Age of Onset  . Obesity Mother   . Hyperlipidemia Mother   . Obesity Sister   . Obesity Brother   . Obesity Sister   . Ovarian cancer Maternal Grandmother 26       Cervical/Sqammacell    Social History   Socioeconomic History  .  Marital status: Married    Spouse name: Not on file  . Number of children: 2  . Years of education: Not on file  . Highest education level: Some college, no degree  Occupational History  . Occupation: Multimedia programmer   Tobacco Use  . Smoking status: Never Smoker  . Smokeless tobacco: Never Used  Vaping Use  . Vaping Use: Never used  Substance and Sexual Activity  . Alcohol use: Yes    Comment: RARE  . Drug use: No  . Sexual activity: Yes    Partners: Male    Birth control/protection: Surgical    Comment: Ablation  Other Topics Concern  . Not on file  Social History Narrative      She also goes to school full time at [employer] Strain: Not on file  Food Insecurity: Not on file  Transportation Needs: Not on file  Physical Activity: Not on file  Stress: Not on file  Social Connections: Not on file  Intimate Partner Violence: Not on file    Review of Systems: See HPI, otherwise negative ROS  Physical Exam: BP (!) 106/50   Pulse 60   Temp 97.8 F (36.6 C) (Temporal)   Ht 5\' 4"  (1.626 m)   Wt 108.4 kg   SpO2 97%   BMI 41.02 kg/m  General:   Alert,  pleasant and cooperative in NAD Head:  Normocephalic and atraumatic. Neck:  Supple; no masses or thyromegaly. Lungs:  Clear throughout to auscultation, normal respiratory effort.    Heart:  +S1, +S2, Regular rate and rhythm, No edema. Abdomen:  Soft, nontender and nondistended. Normal bowel sounds, without guarding, and without rebound.   Neurologic:  Alert and  oriented x4;  grossly normal neurologically.  Impression/Plan: Jean Pitts is here for a colonoscopy to be performed for  FOBT positive and EGD for melena, iron deficiency anemia.  Risks, benefits, limitations, and alternatives regarding the procedures have been reviewed with the patient.  Questions have been answered.  All parties agreeable.   Virgel Manifold, MD  12/31/2020, 12:29 PM

## 2020-12-31 NOTE — Anesthesia Postprocedure Evaluation (Signed)
Anesthesia Post Note  Patient: Jean Pitts  Procedure(s) Performed: ESOPHAGOGASTRODUODENOSCOPY (EGD) WITH PROPOFOL (N/A ) COLONOSCOPY WITH PROPOFOL. eleview and spot ink tattoo used. (N/A )     Patient location during evaluation: PACU Anesthesia Type: General Level of consciousness: awake and alert Pain management: pain level controlled Vital Signs Assessment: post-procedure vital signs reviewed and stable Respiratory status: spontaneous breathing, nonlabored ventilation, respiratory function stable and patient connected to nasal cannula oxygen Cardiovascular status: blood pressure returned to baseline and stable Postop Assessment: no apparent nausea or vomiting Anesthetic complications: no   No complications documented.  Adele Barthel Kamica Florance

## 2020-12-31 NOTE — Op Note (Signed)
[facility] Patient Name: Jean Pitts Procedure Date: 12/31/2020 12:37 PM MRN: [id number] Account #: [id number] Date of Birth: Sep 11, 1986 Admit Type: Outpatient Age: 35 Room: [facility] OR ROOM 01 Gender: Female Note Status: Finalized Procedure:             Upper GI endoscopy Indications:           Melena Providers:             Meldrick Buttery B. Bonna Gains MD, MD Referring MD:          Pleas Koch (Referring MD) Medicines:             Monitored Anesthesia Care Complications:         No immediate complications. Procedure:             Pre-Anesthesia Assessment:                        - The risks and benefits of the procedure and the                         sedation options and risks were discussed with the                         patient. All questions were answered and informed                         consent was obtained.                        - Patient identification and proposed procedure were                         verified prior to the procedure.                        - ASA Grade Assessment: II - A patient with mild                         systemic disease.                        After obtaining informed consent, the endoscope was                         passed under direct vision. Throughout the procedure,                         the patient's blood pressure, pulse, and oxygen                         saturations were monitored continuously. The Endoscope                         was introduced through the mouth, and advanced to the                         second part of duodenum. The upper GI endoscopy was                         accomplished with ease. The  patient tolerated the                         procedure well. Findings:      The examined esophagus was normal.      Evidence of a Roux-en-Y gastrojejunostomy was found. The gastrojejunal       anastomosis was characterized by ulceration.      The exam of the stomach was otherwise normal.       Exam of the jejunum was otherwise normal. Impression:            - Normal esophagus.                        - Roux-en-Y gastrojejunostomy with gastrojejunal                         anastomosis characterized by ulceration.                        - No specimens collected.                        - No evidence of active or recent bleeding seen                         throughout the exam. Recommendation:        - Use Protonix (pantoprazole) 40 mg PO BID.                        - Continue present medications.                        - Avoid NSAIDs except Aspirin if medically indicated                         by PCP                        - Return to my office in 4 weeks.                        - The findings and recommendations were discussed with                         the patient.                        - The findings and recommendations were discussed with                         the patient's family.                        - Return to primary care physician as previously                         scheduled. Procedure Code(s):     --- Professional ---                        [phone], Esophagogastroduodenoscopy, flexible,  transoral; diagnostic, including collection of                         specimen(s) by brushing or washing, when performed                         (separate procedure) Diagnosis Code(s):     --- Professional ---                        Z98.0, Intestinal bypass and anastomosis status                        K92.1, Melena (includes Hematochezia) CPT copyright 2019 American Medical Association. All rights reserved. The codes documented in this report are preliminary and upon coder review may  be revised to meet current compliance requirements.  [name], MD Margretta Sidle B. Bonna Gains MD, MD 12/31/2020 1:01:29 PM This report has been signed electronically. Number of Addenda: 0 Note Initiated On: 12/31/2020 12:37 PM Total Procedure Duration: 0 hours 5  minutes 26 seconds       [facility]

## 2020-12-31 NOTE — Anesthesia Preprocedure Evaluation (Signed)
Anesthesia Evaluation  Patient identified by MRN, date of birth, ID band Patient awake    History of Anesthesia Complications (+) PONV, Family history of anesthesia reaction and history of anesthetic complications (history of maternal aunt with some type of complication, patient not able to give details but sounds like related to underlying pulmonary disease)  Airway Mallampati: II  TM Distance: >3 FB Neck ROM: Full    Dental no notable dental hx.    Pulmonary    Pulmonary exam normal        Cardiovascular Exercise Tolerance: Good hypertension, Normal cardiovascular exam     Neuro/Psych  Headaches,    GI/Hepatic negative GI ROS, Neg liver ROS,   Endo/Other  Morbid obesity (BMI 41)  Renal/GU negative Renal ROS     Musculoskeletal   Abdominal   Peds  Hematology negative hematology ROS (+)   Anesthesia Other Findings   Reproductive/Obstetrics                             Anesthesia Physical Anesthesia Plan  ASA: III  Anesthesia Plan: General   Post-op Pain Management:    Induction: Intravenous  PONV Risk Score and Plan: 4 or greater and Propofol infusion, TIVA and Treatment may vary due to age or medical condition  Airway Management Planned: Nasal Cannula and Natural Airway  Additional Equipment: None  Intra-op Plan:   Post-operative Plan:   Informed Consent: I have reviewed the patients History and Physical, chart, labs and discussed the procedure including the risks, benefits and alternatives for the proposed anesthesia with the patient or authorized representative who has indicated his/her understanding and acceptance.       Plan Discussed with: CRNA  Anesthesia Plan Comments:         Anesthesia Quick Evaluation

## 2020-12-31 NOTE — Transfer of Care (Signed)
Immediate Anesthesia Transfer of Care Note  Patient: Jean Pitts  Procedure(s) Performed: ESOPHAGOGASTRODUODENOSCOPY (EGD) WITH PROPOFOL (N/A ) COLONOSCOPY WITH PROPOFOL. eleview and spot ink tattoo used. (N/A )  Patient Location: PACU  Anesthesia Type: General  Level of Consciousness: awake, alert  and patient cooperative  Airway and Oxygen Therapy: Patient Spontanous Breathing and Patient connected to supplemental oxygen  Post-op Assessment: Post-op Vital signs reviewed, Patient's Cardiovascular Status Stable, Respiratory Function Stable, Patent Airway and No signs of Nausea or vomiting  Post-op Vital Signs: Reviewed and stable  Complications: No complications documented.

## 2020-12-31 NOTE — Progress Notes (Signed)
Rx RF diflucan for once wkly dosing to prevent yeast vag sx.

## 2021-01-01 ENCOUNTER — Encounter: Payer: Self-pay | Admitting: Gastroenterology

## 2021-01-02 ENCOUNTER — Encounter: Payer: BLUE CROSS/BLUE SHIELD | Admitting: Primary Care

## 2021-01-02 LAB — SURGICAL PATHOLOGY

## 2021-01-04 ENCOUNTER — Encounter: Payer: Self-pay | Admitting: Gastroenterology

## 2021-01-08 ENCOUNTER — Other Ambulatory Visit: Payer: BLUE CROSS/BLUE SHIELD

## 2021-01-11 ENCOUNTER — Other Ambulatory Visit: Payer: Self-pay | Admitting: Primary Care

## 2021-01-11 DIAGNOSIS — K921 Melena: Secondary | ICD-10-CM

## 2021-01-17 ENCOUNTER — Encounter: Payer: Self-pay | Admitting: Primary Care

## 2021-01-17 ENCOUNTER — Other Ambulatory Visit: Payer: Self-pay | Admitting: Primary Care

## 2021-01-17 ENCOUNTER — Other Ambulatory Visit: Payer: Self-pay

## 2021-01-17 ENCOUNTER — Ambulatory Visit (INDEPENDENT_AMBULATORY_CARE_PROVIDER_SITE_OTHER): Payer: BLUE CROSS/BLUE SHIELD | Admitting: Primary Care

## 2021-01-17 VITALS — BP 118/78 | HR 91 | Temp 97.7°F | Ht 64.0 in | Wt 238.0 lb

## 2021-01-17 DIAGNOSIS — Z9884 Bariatric surgery status: Secondary | ICD-10-CM

## 2021-01-17 DIAGNOSIS — Z0000 Encounter for general adult medical examination without abnormal findings: Secondary | ICD-10-CM | POA: Insufficient documentation

## 2021-01-17 DIAGNOSIS — G43009 Migraine without aura, not intractable, without status migrainosus: Secondary | ICD-10-CM | POA: Diagnosis not present

## 2021-01-17 DIAGNOSIS — D519 Vitamin B12 deficiency anemia, unspecified: Secondary | ICD-10-CM

## 2021-01-17 DIAGNOSIS — K921 Melena: Secondary | ICD-10-CM

## 2021-01-17 DIAGNOSIS — E282 Polycystic ovarian syndrome: Secondary | ICD-10-CM

## 2021-01-17 DIAGNOSIS — R42 Dizziness and giddiness: Secondary | ICD-10-CM | POA: Insufficient documentation

## 2021-01-17 DIAGNOSIS — E785 Hyperlipidemia, unspecified: Secondary | ICD-10-CM

## 2021-01-17 DIAGNOSIS — K635 Polyp of colon: Secondary | ICD-10-CM

## 2021-01-17 DIAGNOSIS — Z0001 Encounter for general adult medical examination with abnormal findings: Secondary | ICD-10-CM | POA: Insufficient documentation

## 2021-01-17 MED ORDER — PANTOPRAZOLE SODIUM 40 MG PO TBEC: 40.0000 mg | DELAYED_RELEASE_TABLET | Freq: Two times a day (BID) | ORAL | 0 refills | Status: DC

## 2021-01-17 NOTE — Assessment & Plan Note (Signed)
Likely not absorbing vitamins and iron. Repeat vitamin levels pending.

## 2021-01-17 NOTE — Assessment & Plan Note (Signed)
Immunizations UTD. Pap smear due this year, follows with GYN. Colonoscopy due in 2023.  Discussed the importance of a healthy diet and regular exercise in order for weight loss, and to reduce the risk of any potential medical problems.  Exam today stable. Labs pending.

## 2021-01-17 NOTE — Assessment & Plan Note (Signed)
Likely secondary to anemia from recent GI ulcer.  Agree to provide extension to work accommodations. She will work from home through May 2022.

## 2021-01-17 NOTE — Assessment & Plan Note (Signed)
Discussed the importance of a healthy diet and regular exercise in order for weight loss, and to reduce the risk of any potential medical problems.  Repeat lipid panel pending.

## 2021-01-17 NOTE — Assessment & Plan Note (Signed)
No improvement with propranolol ER 80 mg. Has failed treatment on amitriptyline and Topamax.  Referral placed to neurology for management.

## 2021-01-17 NOTE — Progress Notes (Signed)
Subjective:    Patient ID: Jean Pitts, female    DOB: 20-Aug-1986, 35 y.o.   MRN: [id number]  HPI  GLANDA SPANBAUER is a very pleasant 35 y.o. female who presents today for complete physical.  She is also requesting an updated letter for work accommodations given her ongoing dizziness and fatigue. She doesn't feel comfortable driving and has been working from home. She has no symptoms of dizziness if sitting or remaining still. She has to have supervision when showering. She is needing an extension through early June.   She also would like to discuss headaches. She is currently managed on propranolol ER 80 mg which has not helped. She's also tried Topamax which causes drowsiness and amitriptyline which was ineffective. She is experiencing headaches nearly daily. She would like to see neurology.   Immunizations: -Tetanus: 2013 -Influenza: Did not complete this season  -Covid-19: Completed J&J in 2021  Diet: She endorses a fair diet.  Exercise: She is not exercising.  Eye exam:  No recent exam Dental exam: Completes annually   Pap Smear: August 2019, follows with GYN Colonoscopy: Completed in 2022, due in 2023   BP Readings from Last 3 Encounters:  01/17/21 118/78  12/31/20 105/76  12/27/20 129/80      Review of Systems  Constitutional: Positive for fatigue. Negative for unexpected weight change.  HENT: Negative for rhinorrhea.   Eyes: Negative for visual disturbance.  Respiratory: Negative for cough and shortness of breath.   Cardiovascular: Positive for palpitations. Negative for chest pain.  Gastrointestinal: Negative for constipation and diarrhea.  Genitourinary: Negative for difficulty urinating.  Musculoskeletal: Negative for arthralgias.  Skin: Negative for rash.  Allergic/Immunologic: Negative for environmental allergies.  Neurological: Positive for dizziness and headaches.  Psychiatric/Behavioral: The patient is not nervous/anxious.          Past  Medical History:  Diagnosis Date  . B12 deficiency   . Family history of adverse reaction to anesthesia    MATERNAL AUNT-PT UNAWARE OF WHAT HAPPENED WITH ANESTHESIA BUT STATES HER AUNT HAS HAD PROBLEMS IN PAST   . Genital herpes   . Headache    MIGRAINE  . Hyperlipidemia   . Hypertension   . Migraine   . Obesity   . PONV (postoperative nausea and vomiting)   . Syncope 04-2015  . Vitamin D deficiency     Social History   Socioeconomic History  . Marital status: Married    Spouse name: Not on file  . Number of children: 2  . Years of education: Not on file  . Highest education level: Some college, no degree  Occupational History  . Occupation: Multimedia programmer   Tobacco Use  . Smoking status: Never Smoker  . Smokeless tobacco: Never Used  Vaping Use  . Vaping Use: Never used  Substance and Sexual Activity  . Alcohol use: Yes    Comment: RARE  . Drug use: No  . Sexual activity: Yes    Partners: Male    Birth control/protection: Surgical    Comment: Ablation  Other Topics Concern  . Not on file  Social History Narrative      She also goes to school full time at [employer] Strain: Not on file  Food Insecurity: Not on file  Transportation Needs: Not on file  Physical Activity: Not on file  Stress: Not on file  Social Connections: Not on file  Intimate Partner Violence: Not on file  Past Surgical History:  Procedure Laterality Date  . APPENDECTOMY    . BUNIONECTOMY    . CESAREAN SECTION     X2  . COLONOSCOPY WITH PROPOFOL N/A 12/31/2020   Procedure: COLONOSCOPY WITH PROPOFOL. eleview and spot ink tattoo used.;  Surgeon: Virgel Manifold, MD;  Location: [facility];  Service: Endoscopy;  Laterality: N/A;  . DIAGNOSTIC LAPAROSCOPY    . ENDOMETRIAL BIOPSY    . ESOPHAGOGASTRODUODENOSCOPY (EGD) WITH PROPOFOL N/A 12/31/2020   Procedure: ESOPHAGOGASTRODUODENOSCOPY (EGD) WITH PROPOFOL;  Surgeon:  Virgel Manifold, MD;  Location: [facility];  Service: Endoscopy;  Laterality: N/A;  . GASTRIC BYPASS    . LASIK    . MOUTH SURGERY     Tongue Cyst  . NOVASURE ABLATION N/A 06/29/2015   Procedure: DILATION AND CURETTAGE, HYSTEROSCOPY, NOVASURE ABLATION;  Surgeon: Malachy Mood, MD;  Location: [facility];  Service: Gynecology;  Laterality: N/A;  . TUBAL LIGATION    . WISDOM TOOTH EXTRACTION      Family History  Problem Relation Age of Onset  . Obesity Mother   . Hyperlipidemia Mother   . Obesity Sister   . Obesity Brother   . Obesity Sister   . Ovarian cancer Maternal Grandmother 26       Cervical/Sqammacell    Allergies  Allergen Reactions  . Vicodin [Hydrocodone-Acetaminophen] Nausea And Vomiting  . Codeine Other (See Comments)    Other reaction(s): Other (See Comments)  . Hydrocodone-Acetaminophen Nausea And Vomiting    Current Outpatient Medications on File Prior to Visit  Medication Sig Dispense Refill  . fluconazole (DIFLUCAN) 150 MG tablet Take 1 tablet (150 mg total) by mouth once a week. As preventive 12 tablet 0  . pantoprazole (PROTONIX) 40 MG tablet TAKE 1 TABLET (40 MG TOTAL) BY MOUTH 2 (TWO) TIMES DAILY BEFORE A MEAL. FOR GI BLEED. 60 tablet 0  . propranolol ER (INDERAL LA) 80 MG 24 hr capsule Take 1 capsule (80 mg total) by mouth daily. For headache prevention. 30 capsule 0  . sucralfate (CARAFATE) 1 GM/10ML suspension Take 10 mLs (1 g total) by mouth 4 (four) times daily. 1200 mL 0  . SUMAtriptan Succinate Refill 6 MG/0.5ML SOCT Inject into the skin.     No current facility-administered medications on file prior to visit.    BP 118/78 (BP Location: Right Arm, Patient Position: Sitting, Cuff Size: Large)   Pulse 91   Temp 97.7 F (36.5 C) (Temporal)   Ht 5\' 4"  (1.626 m)   Wt 238 lb (108 kg)   SpO2 99%   BMI 40.85 kg/m  Objective:   Physical Exam HENT:     Right Ear: Tympanic membrane and ear canal normal.     Left Ear: Tympanic membrane  and ear canal normal.     Nose: Nose normal.  Eyes:     Conjunctiva/sclera: Conjunctivae normal.     Pupils: Pupils are equal, round, and reactive to light.  Neck:     Thyroid: No thyromegaly.  Cardiovascular:     Rate and Rhythm: Normal rate and regular rhythm.     Heart sounds: No murmur heard.   Pulmonary:     Effort: Pulmonary effort is normal.     Breath sounds: Normal breath sounds. No rales.  Abdominal:     General: Bowel sounds are normal.     Palpations: Abdomen is soft.     Tenderness: There is no abdominal tenderness.  Musculoskeletal:        General:  Normal range of motion.     Cervical back: Neck supple.  Lymphadenopathy:     Cervical: No cervical adenopathy.  Skin:    General: Skin is warm and dry.     Coloration: Skin is pale.     Findings: No rash.  Neurological:     Mental Status: She is alert and oriented to person, place, and time.     Cranial Nerves: No cranial nerve deficit.     Deep Tendon Reflexes: Reflexes are normal and symmetric.  Psychiatric:        Mood and Affect: Mood normal.           Assessment & Plan:      This visit occurred during the SARS-CoV-2 public health emergency.  Safety protocols were in place, including screening questions prior to the visit, additional usage of staff PPE, and extensive cleaning of exam room while observing appropriate contact time as indicated for disinfecting solutions.

## 2021-01-17 NOTE — Telephone Encounter (Signed)
Will you call to find out what's going on and what may be covered?

## 2021-01-17 NOTE — Assessment & Plan Note (Signed)
Compliant to pantoprazole 40 mg BID, ferrous sulfate 325 mg BID, and Carafate. Continue all.  Colonoscopy and endoscopy reviewed.

## 2021-01-17 NOTE — Assessment & Plan Note (Signed)
Large polyp removed, due for repeat colonoscopy in 2023.

## 2021-01-17 NOTE — Patient Instructions (Signed)
You will be contacted regarding your referral to neurology for headache management.  Please let us know if you have not been contacted within two weeks.   Schedule a lab appointment to return fasting four hours.   Continue pantoprazole 40 mg twice daily and oral Iron as recommended.   It was a pleasure to see you today!   Preventive Care 37-35 Years Old, Female Preventive care refers to lifestyle choices and visits with your health care provider that can promote health and wellness. This includes:  A yearly physical exam. This is also called an annual wellness visit.  Regular dental and eye exams.  Immunizations.  Screening for certain conditions.  Healthy lifestyle choices, such as: ? Eating a healthy diet. ? Getting regular exercise. ? Not using drugs or products that contain nicotine and tobacco. ? Limiting alcohol use. What can I expect for my preventive care visit? Physical exam Your health care provider may check your:  Height and weight. These may be used to calculate your BMI (body mass index). BMI is a measurement that tells if you are at a healthy weight.  Heart rate and blood pressure.  Body temperature.  Skin for abnormal spots. Counseling Your health care provider may ask you questions about your:  Past medical problems.  Family's medical history.  Alcohol, tobacco, and drug use.  Emotional well-being.  Home life and relationship well-being.  Sexual activity.  Diet, exercise, and sleep habits.  Work and work Statistician.  Access to firearms.  Method of birth control.  Menstrual cycle.  Pregnancy history. What immunizations do I need? Vaccines are usually given at various ages, according to a schedule. Your health care provider will recommend vaccines for you based on your age, medical history, and lifestyle or other factors, such as travel or where you work.   What tests do I need? Blood tests  Lipid and cholesterol levels. These may be  checked every 5 years starting at age 4.  Hepatitis C test.  Hepatitis B test. Screening  Diabetes screening. This is done by checking your blood sugar (glucose) after you have not eaten for a while (fasting).  STD (sexually transmitted disease) testing, if you are at risk.  BRCA-related cancer screening. This may be done if you have a family history of breast, ovarian, tubal, or peritoneal cancers.  Pelvic exam and Pap test. This may be done every 3 years starting at age 24. Starting at age 70, this may be done every 5 years if you have a Pap test in combination with an HPV test. Talk with your health care provider about your test results, treatment options, and if necessary, the need for more tests.   Follow these instructions at home: Eating and drinking  Eat a healthy diet that includes fresh fruits and vegetables, whole grains, lean protein, and low-fat dairy products.  Take vitamin and mineral supplements as recommended by your health care provider.  Do not drink alcohol if: ? Your health care provider tells you not to drink. ? You are pregnant, may be pregnant, or are planning to become pregnant.  If you drink alcohol: ? Limit how much you have to 0-1 drink a day. ? Be aware of how much alcohol is in your drink. In the [place] one drink equals one 12 oz bottle of beer (355 mL), one 5 oz glass of wine (148 mL), or one 1 oz glass of hard liquor (44 mL).   Lifestyle  Take daily care of your teeth and  gums. Brush your teeth every morning and night with fluoride toothpaste. Floss one time each day.  Stay active. Exercise for at least 30 minutes 5 or more days each week.  Do not use any products that contain nicotine or tobacco, such as cigarettes, e-cigarettes, and chewing tobacco. If you need help quitting, ask your health care provider.  Do not use drugs.  If you are sexually active, practice safe sex. Use a condom or other form of protection to prevent STIs (sexually  transmitted infections).  If you do not wish to become pregnant, use a form of birth control. If you plan to become pregnant, see your health care provider for a prepregnancy visit.  Find healthy ways to cope with stress, such as: ? Meditation, yoga, or listening to music. ? Journaling. ? Talking to a trusted person. ? Spending time with friends and family. Safety  Always wear your seat belt while driving or riding in a vehicle.  Do not drive: ? If you have been drinking alcohol. Do not ride with someone who has been drinking. ? When you are tired or distracted. ? While texting.  Wear a helmet and other protective equipment during sports activities.  If you have firearms in your house, make sure you follow all gun safety procedures.  Seek help if you have been physically or sexually abused. What's next?  Go to your health care provider once a year for an annual wellness visit.  Ask your health care provider how often you should have your eyes and teeth checked.  Stay up to date on all vaccines. This information is not intended to replace advice given to you by your health care provider. Make sure you discuss any questions you have with your health care provider. Document Revised: 05/13/2020 Document Reviewed: 05/27/2018 Elsevier Patient Education  2021 [employer].

## 2021-01-17 NOTE — Assessment & Plan Note (Signed)
Following with GYN

## 2021-01-18 ENCOUNTER — Encounter: Payer: Self-pay | Admitting: Primary Care

## 2021-01-18 ENCOUNTER — Other Ambulatory Visit (INDEPENDENT_AMBULATORY_CARE_PROVIDER_SITE_OTHER): Payer: BLUE CROSS/BLUE SHIELD

## 2021-01-18 ENCOUNTER — Other Ambulatory Visit: Payer: Self-pay | Admitting: Primary Care

## 2021-01-18 ENCOUNTER — Other Ambulatory Visit: Payer: Self-pay

## 2021-01-18 DIAGNOSIS — E559 Vitamin D deficiency, unspecified: Secondary | ICD-10-CM

## 2021-01-18 DIAGNOSIS — Z0000 Encounter for general adult medical examination without abnormal findings: Secondary | ICD-10-CM | POA: Diagnosis not present

## 2021-01-18 DIAGNOSIS — Z9884 Bariatric surgery status: Secondary | ICD-10-CM | POA: Diagnosis not present

## 2021-01-18 DIAGNOSIS — D519 Vitamin B12 deficiency anemia, unspecified: Secondary | ICD-10-CM

## 2021-01-18 LAB — COMPREHENSIVE METABOLIC PANEL
ALT: 18 U/L (ref 0–35)
AST: 20 U/L (ref 0–37)
Albumin: 3.9 g/dL (ref 3.5–5.2)
Alkaline Phosphatase: 99 U/L (ref 39–117)
BUN: 10 mg/dL (ref 6–23)
CO2: 28 mEq/L (ref 19–32)
Calcium: 9.2 mg/dL (ref 8.4–10.5)
Chloride: 105 mEq/L (ref 96–112)
Creatinine, Ser: 0.64 mg/dL (ref 0.40–1.20)
GFR: 115.16 mL/min (ref >60.00)
Glucose, Bld: 81 mg/dL (ref 70–99)
Potassium: 4.2 mEq/L (ref 3.5–5.1)
Sodium: 140 mEq/L (ref 135–145)
Total Bilirubin: 0.4 mg/dL (ref 0.2–1.2)
Total Protein: 6.8 g/dL (ref 6.0–8.3)

## 2021-01-18 LAB — LIPID PANEL
Cholesterol: 204 mg/dL — ABNORMAL HIGH (ref 0–200)
HDL: 51.3 mg/dL (ref >39.00)
LDL Cholesterol: 129 mg/dL — ABNORMAL HIGH (ref 0–99)
NonHDL: 152.48
Total CHOL/HDL Ratio: 4
Triglycerides: 116 mg/dL (ref 0.0–149.0)
VLDL: 23.2 mg/dL (ref 0.0–40.0)

## 2021-01-18 LAB — IBC + FERRITIN
Ferritin: 4.1 ng/mL — ABNORMAL LOW (ref 10.0–291.0)
Iron: 30 ug/dL — ABNORMAL LOW (ref 42–145)
Saturation Ratios: 6.3 % — ABNORMAL LOW (ref 20.0–50.0)
Transferrin: 341 mg/dL (ref 212.0–360.0)

## 2021-01-18 LAB — CBC
HCT: 28.6 % — ABNORMAL LOW (ref 36.0–46.0)
Hemoglobin: 9.9 g/dL — ABNORMAL LOW (ref 12.0–15.0)
MCHC: 34.7 g/dL (ref 30.0–36.0)
MCV: 83.8 fl (ref 78.0–100.0)
Platelets: 419 10*3/uL — ABNORMAL HIGH (ref 150.0–400.0)
RBC: 3.41 Mil/uL — ABNORMAL LOW (ref 3.87–5.11)
RDW: 14.3 % (ref 11.5–15.5)
WBC: 7.1 10*3/uL (ref 4.0–10.5)

## 2021-01-18 LAB — HEMOGLOBIN A1C: Hgb A1c MFr Bld: 5.3 % (ref 4.6–6.5)

## 2021-01-18 LAB — VITAMIN B12: Vitamin B-12: 332 pg/mL (ref 211–911)

## 2021-01-18 LAB — FOLATE: Folate: 13.7 ng/mL (ref >5.9)

## 2021-01-18 LAB — VITAMIN D 25 HYDROXY (VIT D DEFICIENCY, FRACTURES): VITD: 15.07 ng/mL — ABNORMAL LOW (ref 30.00–100.00)

## 2021-01-18 LAB — TSH: TSH: 1.29 u[IU]/mL (ref 0.35–4.50)

## 2021-01-18 MED ORDER — VITAMIN D (ERGOCALCIFEROL) 1.25 MG (50000 UNIT) PO CAPS: ORAL_CAPSULE | ORAL | 0 refills | Status: DC

## 2021-01-19 ENCOUNTER — Other Ambulatory Visit: Payer: Self-pay | Admitting: Primary Care

## 2021-01-19 DIAGNOSIS — G43009 Migraine without aura, not intractable, without status migrainosus: Secondary | ICD-10-CM

## 2021-01-21 ENCOUNTER — Telehealth: Payer: Self-pay

## 2021-01-21 NOTE — Telephone Encounter (Signed)
Patient called and stated that starting Saturday she has been experiencing worsening abdominal pain, black/tarry stools, decreased appetite, nausea, and dizziness. Patient denies BRB in stool, vomiting, diarrhea, SOB or chest pain. Advised patient to call GI doctor and speak with a triage nurse and if symptoms worsen to go to ED. Patient verbalized understanding.

## 2021-01-21 NOTE — Telephone Encounter (Signed)
Pt has f/u with GI per other message sent do you still want me to call or hold off on until she has been seen.

## 2021-01-21 NOTE — Telephone Encounter (Signed)
Patient would like to be seen asap due to stomach cramps and black stool. Scheduled her for office visit on Tues 01/22/21 @1 :30pm/[place]

## 2021-01-22 ENCOUNTER — Ambulatory Visit: Payer: BLUE CROSS/BLUE SHIELD | Admitting: Gastroenterology

## 2021-01-22 ENCOUNTER — Other Ambulatory Visit: Payer: Self-pay

## 2021-01-22 ENCOUNTER — Other Ambulatory Visit
Admission: RE | Admit: 2021-01-22 | Discharge: 2021-01-22 | Disposition: A | Discharge status: Home / Self Care | Payer: BLUE CROSS/BLUE SHIELD | Source: Home / Self Care | Attending: Gastroenterology | Admitting: Gastroenterology

## 2021-01-22 ENCOUNTER — Encounter: Payer: Self-pay | Admitting: Gastroenterology

## 2021-01-22 VITALS — BP 111/79 | HR 74 | Temp 97.5°F | Ht 64.0 in | Wt 243.4 lb

## 2021-01-22 DIAGNOSIS — K921 Melena: Secondary | ICD-10-CM | POA: Diagnosis not present

## 2021-01-22 DIAGNOSIS — K284 Chronic or unspecified gastrojejunal ulcer with hemorrhage: Secondary | ICD-10-CM | POA: Diagnosis not present

## 2021-01-22 DIAGNOSIS — D500 Iron deficiency anemia secondary to blood loss (chronic): Secondary | ICD-10-CM | POA: Insufficient documentation

## 2021-01-22 DIAGNOSIS — D509 Iron deficiency anemia, unspecified: Secondary | ICD-10-CM

## 2021-01-22 DIAGNOSIS — Z885 Allergy status to narcotic agent status: Secondary | ICD-10-CM | POA: Diagnosis not present

## 2021-01-22 DIAGNOSIS — D62 Acute posthemorrhagic anemia: Secondary | ICD-10-CM | POA: Diagnosis not present

## 2021-01-22 DIAGNOSIS — Z980 Intestinal bypass and anastomosis status: Secondary | ICD-10-CM | POA: Diagnosis not present

## 2021-01-22 DIAGNOSIS — Z886 Allergy status to analgesic agent status: Secondary | ICD-10-CM | POA: Diagnosis not present

## 2021-01-22 DIAGNOSIS — Z9884 Bariatric surgery status: Secondary | ICD-10-CM | POA: Diagnosis not present

## 2021-01-22 DIAGNOSIS — Z79899 Other long term (current) drug therapy: Secondary | ICD-10-CM | POA: Diagnosis not present

## 2021-01-22 DIAGNOSIS — Z9049 Acquired absence of other specified parts of digestive tract: Secondary | ICD-10-CM | POA: Diagnosis not present

## 2021-01-22 LAB — CBC
HCT: 27.4 % — ABNORMAL LOW (ref 36.0–46.0)
Hemoglobin: 8.6 g/dL — ABNORMAL LOW (ref 12.0–15.0)
MCH: 27 pg (ref 26.0–34.0)
MCHC: 31.4 g/dL (ref 30.0–36.0)
MCV: 86.2 fL (ref 80.0–100.0)
Platelets: 435 10*3/uL — ABNORMAL HIGH (ref 150–400)
RBC: 3.18 MIL/uL — ABNORMAL LOW (ref 3.87–5.11)
RDW: 13.2 % (ref 11.5–15.5)
WBC: 5.3 10*3/uL (ref 4.0–10.5)
nRBC: 0 % (ref 0.0–0.2)

## 2021-01-22 NOTE — Progress Notes (Signed)
[name], MD [address]  [place]  [place],  [address]  Main: [phone]  Fax: [phone]   Primary Care Physician: Pleas Koch, NP   Chief Complaint  Patient presents with  . Melena    HPI: Jean Pitts is a 35 y.o. female with 1 day history of recurrent melena.  Symptoms started yesterday morning around 8 AM.  Patient had 3-4 episodes of melanotic stool at home yesterday, with last episode being yesterday evening and none since then.  No emesis.  No hematemesis.  Patient denies any NSAID use.  She has been compliant with her Protonix twice daily, and sucralfate.  Recent upper endoscopy on 12/31/2020 with anastomotic ulcer seen.  Colonoscopy with 15 mm colon polyp removed and clip placed at the site.  Patient has not had any bright red blood per rectum since the procedure.  Current Outpatient Medications  Medication Sig Dispense Refill  . ferrous sulfate 325 (65 FE) MG tablet Take 2 tablets by mouth in the morning and at bedtime.    . fluconazole (DIFLUCAN) 150 MG tablet Take 1 tablet (150 mg total) by mouth once a week. As preventive 12 tablet 0  . pantoprazole (PROTONIX) 40 MG tablet Take 1 tablet (40 mg total) by mouth 2 (two) times daily before a meal. For GI bleed. 180 tablet 0  . propranolol ER (INDERAL LA) 80 MG 24 hr capsule TAKE 1 CAPSULE (80 MG TOTAL) BY MOUTH DAILY. FOR HEADACHE PREVENTION. 90 capsule 0  . sucralfate (CARAFATE) 1 GM/10ML suspension Take 10 mLs (1 g total) by mouth 4 (four) times daily. 1200 mL 0  . SUMAtriptan Succinate Refill 6 MG/0.5ML SOCT Inject into the skin.    . Vitamin D, Ergocalciferol, (DRISDOL) 1.25 MG (50000 UNIT) CAPS capsule Take 1 capsule by mouth once weekly for 12 weeks. 12 capsule 0   No current facility-administered medications for this visit.    Allergies as of 01/22/2021 - Review Complete 01/22/2021  Allergen Reaction Noted  . Vicodin [hydrocodone-acetaminophen] Nausea And Vomiting 06/26/2015   . Codeine Other (See Comments) 04/12/2014  . Hydrocodone-acetaminophen Nausea And Vomiting 06/26/2015    ROS:  General: Negative for anorexia, weight loss, fever, chills, fatigue, weakness. ENT: Negative for hoarseness, difficulty swallowing , nasal congestion. CV: Negative for chest pain, angina, palpitations, dyspnea on exertion, peripheral edema.  Respiratory: Negative for dyspnea at rest, dyspnea on exertion, cough, sputum, wheezing.  GI: See history of present illness. GU:  Negative for dysuria, hematuria, urinary incontinence, urinary frequency, nocturnal urination.  Endo: Negative for unusual weight change.    Physical Examination:   BP 111/79   Pulse 74   Temp (!) 97.5 F (36.4 C) (Oral)   Ht 5\' 4"  (1.626 m)   Wt 243 lb 6.4 oz (110.4 kg)   BMI 41.78 kg/m   General: Well-nourished, well-developed in no acute distress.  Eyes: No icterus. Conjunctivae pink. Mouth: Oropharyngeal mucosa moist and pink , no lesions erythema or exudate. Neck: Supple, Trachea midline Abdomen: Bowel sounds are normal, nontender, nondistended, no hepatosplenomegaly or masses, no abdominal bruits or hernia , no rebound or guarding.   Extremities: No lower extremity edema. No clubbing or deformities. Neuro: Alert and oriented x 3.  Grossly intact. Skin: Warm and dry, no jaundice.   Psych: Alert and cooperative, normal mood and affect.   Labs: CMP     Component Value Date/Time   NA 140 01/18/2021 0841   NA 140 06/25/2020 1521   K  4.2 01/18/2021 0841   CL 105 01/18/2021 0841   CO2 28 01/18/2021 0841   GLUCOSE 81 01/18/2021 0841   BUN 10 01/18/2021 0841   BUN 8 06/25/2020 1521   CREATININE 0.64 01/18/2021 0841   CALCIUM 9.2 01/18/2021 0841   PROT 6.8 01/18/2021 0841   PROT 7.0 06/25/2020 1521   ALBUMIN 3.9 01/18/2021 0841   ALBUMIN 4.3 06/25/2020 1521   AST 20 01/18/2021 0841   ALT 18 01/18/2021 0841   ALKPHOS 99 01/18/2021 0841   BILITOT 0.4 01/18/2021 0841   BILITOT 0.2  06/25/2020 1521   GFRNONAA 103 06/25/2020 1521   GFRAA 118 06/25/2020 1521   Lab Results  Component Value Date   WBC 5.3 01/22/2021   HGB 8.6 (L) 01/22/2021   HCT 27.4 (L) 01/22/2021   MCV 86.2 01/22/2021   PLT 435 (H) 01/22/2021    Imaging Studies: No results found.  Assessment and Plan:   Jean Pitts is a 35 y.o. y/o female here for evaluation of melena and  Stat labs today does show decline in hemoglobin to 8.6, compared to 9.9, 4 days ago, and 10.5, 67-month ago  Patient now has recurrent melena that had stopped after her last visit with [place] on 12/27/2020.    The ulcer seen on her upper endoscopy at the gastrojejunal anastomosis site was clean-based and is at low risk of rebleeding.  I do not suspect this to be the source of her recurrent melena  If she was bleeding from the post polypectomy site in her colon, I would expect to be red blood per rectum, and not melena.  Therefore, given the above, I question if patient has another ulcer at the jejuno jejunal anastomosis site, that was not visualized during her upper endoscopy.  Given that she is hemodynamically stable with reassuring vital signs, and no further bowel movement since yesterday evening, we can proceed with an outpatient push enteroscopy tomorrow for further evaluation of her ongoing melena  However, if she continues to have recurrent episodes of melena, or any other signs of GI bleeding which were discussed in detail with her, I have advised her to go to the ER as she would benefit from inpatient admission  Continue PPI twice daily Continue to avoid NSAIDs  I have discussed alternative options, risks & benefits,  which include, but are not limited to, bleeding, infection, perforation,respiratory complication & drug reaction.  The patient agrees with this plan & written consent will be obtained.    Pt states PCP is working on setting up IV iron replacement, and I would agree with this plan  Dr [name]

## 2021-01-22 NOTE — Telephone Encounter (Signed)
Noted, patient has appointment today (04/26)

## 2021-01-22 NOTE — Patient Instructions (Signed)
To find out your arrival time, call [facility] at [phone] between 1-3 pm the day before your procedure.   Please do not eat or drink after midnight.

## 2021-01-23 ENCOUNTER — Ambulatory Visit
Admission: RE | Admit: 2021-01-23 | Discharge: 2021-01-23 | Disposition: A | Discharge status: Home / Self Care | Payer: BLUE CROSS/BLUE SHIELD | Attending: Gastroenterology | Admitting: Gastroenterology

## 2021-01-23 ENCOUNTER — Ambulatory Visit: Payer: BLUE CROSS/BLUE SHIELD | Admitting: Anesthesiology

## 2021-01-23 ENCOUNTER — Encounter: Payer: Self-pay | Admitting: Gastroenterology

## 2021-01-23 ENCOUNTER — Encounter
Admission: RE | Disposition: A | Discharge status: Home / Self Care | Payer: Self-pay | Source: Home / Self Care | Attending: Gastroenterology

## 2021-01-23 DIAGNOSIS — K289 Gastrojejunal ulcer, unspecified as acute or chronic, without hemorrhage or perforation: Secondary | ICD-10-CM

## 2021-01-23 DIAGNOSIS — Z9884 Bariatric surgery status: Secondary | ICD-10-CM | POA: Insufficient documentation

## 2021-01-23 DIAGNOSIS — Z886 Allergy status to analgesic agent status: Secondary | ICD-10-CM | POA: Insufficient documentation

## 2021-01-23 DIAGNOSIS — T85898A Other specified complication of other internal prosthetic devices, implants and grafts, initial encounter: Secondary | ICD-10-CM

## 2021-01-23 DIAGNOSIS — D62 Acute posthemorrhagic anemia: Secondary | ICD-10-CM | POA: Insufficient documentation

## 2021-01-23 DIAGNOSIS — Z980 Intestinal bypass and anastomosis status: Secondary | ICD-10-CM | POA: Insufficient documentation

## 2021-01-23 DIAGNOSIS — Z885 Allergy status to narcotic agent status: Secondary | ICD-10-CM | POA: Insufficient documentation

## 2021-01-23 DIAGNOSIS — Z79899 Other long term (current) drug therapy: Secondary | ICD-10-CM | POA: Insufficient documentation

## 2021-01-23 DIAGNOSIS — K921 Melena: Secondary | ICD-10-CM | POA: Insufficient documentation

## 2021-01-23 DIAGNOSIS — K284 Chronic or unspecified gastrojejunal ulcer with hemorrhage: Secondary | ICD-10-CM | POA: Insufficient documentation

## 2021-01-23 DIAGNOSIS — Z9049 Acquired absence of other specified parts of digestive tract: Secondary | ICD-10-CM | POA: Insufficient documentation

## 2021-01-23 HISTORY — DX: Gastro-esophageal reflux disease without esophagitis: K21.9

## 2021-01-23 HISTORY — PX: ENTEROSCOPY: SHX5533

## 2021-01-23 SURGERY — ENTEROSCOPY
Anesthesia: General

## 2021-01-23 MED ORDER — ONDANSETRON HCL 4 MG/2ML IJ SOLN
INTRAMUSCULAR | Status: DC | PRN
  Administered 2021-01-23: 4 mg via INTRAVENOUS

## 2021-01-23 MED ORDER — PROPOFOL 500 MG/50ML IV EMUL
INTRAVENOUS | Status: AC
  Filled 2021-01-23: qty 50

## 2021-01-23 MED ORDER — PROPOFOL 10 MG/ML IV BOLUS
INTRAVENOUS | Status: DC | PRN
  Administered 2021-01-23: 20 mg via INTRAVENOUS
  Administered 2021-01-23: 50 mg via INTRAVENOUS

## 2021-01-23 MED ORDER — LIDOCAINE HCL (CARDIAC) PF 100 MG/5ML IV SOSY
PREFILLED_SYRINGE | INTRAVENOUS | Status: DC | PRN
  Administered 2021-01-23: 100 mg via INTRAVENOUS

## 2021-01-23 MED ORDER — PROPOFOL 10 MG/ML IV BOLUS
INTRAVENOUS | Status: AC
  Filled 2021-01-23: qty 20

## 2021-01-23 MED ORDER — OMEPRAZOLE 40 MG PO CPDR: 40.0000 mg | DELAYED_RELEASE_CAPSULE | Freq: Two times a day (BID) | ORAL | 0 refills | Status: DC

## 2021-01-23 MED ORDER — MIDAZOLAM HCL 2 MG/2ML IJ SOLN
INTRAMUSCULAR | Status: AC
  Filled 2021-01-23: qty 2

## 2021-01-23 MED ORDER — MIDAZOLAM HCL 2 MG/2ML IJ SOLN
INTRAMUSCULAR | Status: DC | PRN
  Administered 2021-01-23 (×2): .5 mg via INTRAVENOUS

## 2021-01-23 MED ORDER — PROPOFOL 500 MG/50ML IV EMUL
INTRAVENOUS | Status: DC | PRN
  Administered 2021-01-23: 150 ug/kg/min via INTRAVENOUS

## 2021-01-23 MED ORDER — SODIUM CHLORIDE 0.9 % IV SOLN: INTRAVENOUS | Status: DC

## 2021-01-23 MED ORDER — DEXAMETHASONE SODIUM PHOSPHATE 10 MG/ML IJ SOLN
INTRAMUSCULAR | Status: DC | PRN
  Administered 2021-01-23: 8 mg via INTRAVENOUS

## 2021-01-23 MED ORDER — SUMATRIPTAN SUCCINATE 6 MG/0.5ML ~~LOC~~ SOLN
6.0000 mg | Freq: Once | SUBCUTANEOUS | Status: AC
  Administered 2021-01-23: 6 mg via SUBCUTANEOUS
  Filled 2021-01-23: qty 0.5

## 2021-01-23 NOTE — H&P (Signed)
Jean Antigua, MD [address] [place], [place], [place], [id number], [place], [place], [place], [address] Phone: [phone]  Fax: [phone]  Primary Care Physician:  Pleas Koch, NP   Pre-Procedure History & Physical: HPI:  Jean Pitts is a 35 y.o. female is here for a push enteroscopy.   Past Medical History:  Diagnosis Date  . Abnormal glucose tolerance test in pregnancy, antepartum 06/04/2012   Formatting of this note might be different from the original. Needs 3h GTT  . B12 deficiency   . Family history of adverse reaction to anesthesia    MATERNAL AUNT-PT UNAWARE OF WHAT HAPPENED WITH ANESTHESIA BUT STATES HER AUNT HAS HAD PROBLEMS IN PAST   . Fecal occult blood test positive   . Genital herpes   . GERD (gastroesophageal reflux disease)   . H/O cesarean section complicating pregnancy 02/04/3266  . Hair thinning 05/12/2018  . Headache    MIGRAINE  . Heartburn 10/26/2018  . Hyperlipidemia   . Hypertension   . Migraine   . Obesity   . PONV (postoperative nausea and vomiting)   . Syncope 04-2015  . Vitamin D deficiency   . Yeast vaginitis 02/23/2019   Candida albicans on culture    Past Surgical History:  Procedure Laterality Date  . APPENDECTOMY    . BUNIONECTOMY    . CESAREAN SECTION     X2  . COLONOSCOPY WITH PROPOFOL N/A 12/31/2020   Procedure: COLONOSCOPY WITH PROPOFOL. eleview and spot ink tattoo used.;  Surgeon: Virgel Manifold, MD;  Location: [facility];  Service: Endoscopy;  Laterality: N/A;  . DIAGNOSTIC LAPAROSCOPY    . ENDOMETRIAL BIOPSY    . ESOPHAGOGASTRODUODENOSCOPY (EGD) WITH PROPOFOL N/A 12/31/2020   Procedure: ESOPHAGOGASTRODUODENOSCOPY (EGD) WITH PROPOFOL;  Surgeon: Virgel Manifold, MD;  Location: [facility];  Service: Endoscopy;  Laterality: N/A;  . GASTRIC BYPASS    . LASIK    . MOUTH SURGERY     Tongue Cyst  . NOVASURE ABLATION N/A 06/29/2015   Procedure: DILATION AND CURETTAGE,  HYSTEROSCOPY, NOVASURE ABLATION;  Surgeon: Malachy Mood, MD;  Location: [facility];  Service: Gynecology;  Laterality: N/A;  . TUBAL LIGATION    . WISDOM TOOTH EXTRACTION      Prior to Admission medications   Medication Sig Start Date End Date Taking? Authorizing Provider  ferrous sulfate 325 (65 FE) MG tablet Take 2 tablets by mouth in the morning and at bedtime.   Yes [provider]  fluconazole (DIFLUCAN) 150 MG tablet Take 1 tablet (150 mg total) by mouth once a week. As preventive 10/01/43  Yes Copland, Elmo Putt B, PA-C  pantoprazole (PROTONIX) 40 MG tablet Take 1 tablet (40 mg total) by mouth 2 (two) times daily before a meal. For GI bleed. 01/17/21  Yes Pleas Koch, NP  propranolol ER (INDERAL LA) 80 MG 24 hr capsule TAKE 1 CAPSULE (80 MG TOTAL) BY MOUTH DAILY. FOR HEADACHE PREVENTION. 01/20/21  Yes Pleas Koch, NP  sucralfate (CARAFATE) 1 GM/10ML suspension Take 10 mLs (1 g total) by mouth 4 (four) times daily. 12/28/20 01/27/21 Yes Virgel Manifold, MD  Vitamin D, Ergocalciferol, (DRISDOL) 1.25 MG (50000 UNIT) CAPS capsule Take 1 capsule by mouth once weekly for 12 weeks. 01/18/21  Yes Pleas Koch, NP  SUMAtriptan Succinate Refill 6 MG/0.5ML SOCT Inject into the skin.    [provider]    Allergies as of 01/22/2021 - Review Complete 01/22/2021  Allergen Reaction  Noted  . Vicodin [hydrocodone-acetaminophen] Nausea And Vomiting 06/26/2015  . Codeine Other (See Comments) 04/12/2014  . Hydrocodone-acetaminophen Nausea And Vomiting 06/26/2015    Family History  Problem Relation Age of Onset  . Obesity Mother   . Hyperlipidemia Mother   . Obesity Sister   . Obesity Brother   . Obesity Sister   . Ovarian cancer Maternal Grandmother 26       Cervical/[place]ell    Social History   Socioeconomic History  . Marital status: Married    Spouse name: Not on file  . Number of children: 2  . Years of education: Not on file  . Highest education  level: Some college, no degree  Occupational History  . Occupation: Multimedia programmer   Tobacco Use  . Smoking status: Never Smoker  . Smokeless tobacco: Never Used  Vaping Use  . Vaping Use: Never used  Substance and Sexual Activity  . Alcohol use: Yes    Comment: RARE  . Drug use: No  . Sexual activity: Yes    Partners: Male    Birth control/protection: Surgical    Comment: Ablation  Other Topics Concern  . Not on file  Social History Narrative      She also goes to school full time at [employer] Strain: Not on file  Food Insecurity: Not on file  Transportation Needs: Not on file  Physical Activity: Not on file  Stress: Not on file  Social Connections: Not on file  Intimate Partner Violence: Not on file    Review of Systems: See HPI, otherwise negative ROS  Physical Exam: BP 133/75   Pulse 64   Temp 97.9 F (36.6 C) (Temporal)   Resp 18   Ht 5\' 4"  (1.626 m)   Wt 110.4 kg   SpO2 98%   BMI 41.78 kg/m  General:   Alert,  pleasant and cooperative in NAD Head:  Normocephalic and atraumatic. Neck:  Supple; no masses or thyromegaly. Lungs:  Clear throughout to auscultation, normal respiratory effort.    Heart:  +S1, +S2, Regular rate and rhythm, No edema. Abdomen:  Soft, nontender and nondistended. Normal bowel sounds, without guarding, and without rebound.   Neurologic:  Alert and  oriented x4;  grossly normal neurologically.  Impression/Plan: Jean Pitts is here for an EGD for a push enteroscopy for melena. Last BM was 48 hrs ago. No emesis  Risks, benefits, limitations, and alternatives regarding the procedure have been reviewed with the patient.  Questions have been answered.  All parties agreeable.   Virgel Manifold, MD  01/23/2021, 11:42 AM

## 2021-01-23 NOTE — Anesthesia Procedure Notes (Signed)
Date/Time: 01/23/2021 12:00 PM Performed by: Allean Found, CRNA Pre-anesthesia Checklist: Patient identified, Emergency Drugs available, Suction available, Patient being monitored and Timeout performed Oxygen Delivery Method: Nasal cannula Placement Confirmation: positive ETCO2

## 2021-01-23 NOTE — Transfer of Care (Signed)
Immediate Anesthesia Transfer of Care Note  Patient: Jean Pitts  Procedure(s) Performed: ENTEROSCOPY (N/A )  Patient Location: PACU  Anesthesia Type:General  Level of Consciousness: awake, alert  and oriented  Airway & Oxygen Therapy: Patient Spontanous Breathing and Patient connected to nasal cannula oxygen  Post-op Assessment: Report given to RN and Post -op Vital signs reviewed and stable  Post vital signs: Reviewed and stable  Last Vitals:  Vitals Value Taken Time  BP 124/85 01/23/21 1217  Temp 35.9 C 01/23/21 1216  Pulse 69 01/23/21 1221  Resp 20 01/23/21 1221  SpO2 100 % 01/23/21 1221  Vitals shown include unvalidated device data.  Last Pain:  Vitals:   01/23/21 1216  TempSrc: Temporal  PainSc:          Complications: No complications documented.

## 2021-01-23 NOTE — Op Note (Signed)
[facility] Patient Name: Jean Pitts Procedure Date: 01/23/2021 11:40 AM MRN: [id number] Account #: [id number] Date of Birth: 06/03/1986 Admit Type: Outpatient Age: 35 Room: [facility] ENDO ROOM 3 Gender: Female Note Status: Finalized Procedure:             Small bowel enteroscopy Indications:           Acute post hemorrhagic anemia, Melena, Patient's upper                         endoscopy showed a clean based anastomotic ulcer.                         Melena had resolved but reoccured 3 days ago and is                         associated with further drop in hemoglobin. Melena has                         not reoccured in 2 days. This procedure is to                         re-evaluate the previously seen ulcer site and to                         assess for any other sources of melena more distally                         in her small bowel and assess the more distal                         anastomosis site as well. Providers:             Lennette Bihari. Bonna Gains MD, MD Referring MD:          Pleas Koch (Referring MD) Medicines:             General Anesthesia Complications:         No immediate complications. Procedure:             Pre-Anesthesia Assessment:                        - Prior to the procedure, a History and Physical was                         performed, and patient medications and allergies were                         reviewed. The patient is competent. The risks and                         benefits of the procedure and the sedation options and                         risks were discussed with the patient. All questions                         were answered and informed consent was obtained.  Patient identification and proposed procedure were                         verified by the physician, the nurse, the                         anesthesiologist, the anesthetist and the technician                         in  the pre-procedure area in the procedure room in the                         endoscopy suite. Mental Status Examination: alert and                         oriented. Airway Examination: normal oropharyngeal                         airway and neck mobility. Respiratory Examination:                         clear to auscultation. CV Examination: normal.                         Prophylactic Antibiotics: The patient does not require                         prophylactic antibiotics. Prior Anticoagulants: The                         patient has taken no previous anticoagulant or                         antiplatelet agents. ASA Grade Assessment: II - A                         patient with mild systemic disease. After reviewing                         the risks and benefits, the patient was deemed in                         satisfactory condition to undergo the procedure. The                         anesthesia plan was to use general anesthesia.                         Immediately prior to administration of medications,                         the patient was re-assessed for adequacy to receive                         sedatives. The heart rate, respiratory rate, oxygen                         saturations, blood pressure, adequacy of pulmonary  ventilation, and response to care were monitored                         throughout the procedure. The physical status of the                         patient was re-assessed after the procedure.                        After obtaining informed consent, the endoscope was                         passed under direct vision. Throughout the procedure,                         the patient's blood pressure, pulse, and oxygen                         saturations were monitored continuously. The                         Colonoscope was introduced through the mouth and                         advanced to the mid-jejunum. The small bowel                          enteroscopy was accomplished with ease. The patient                         tolerated the procedure well. Findings:      The examined esophagus was normal.      Evidence of a Roux-en-Y gastrojejunostomy was found. The gastrojejunal       anastomosis was characterized by ulceration. This was traversed. The       pouch-to-jejunum limb was characterized by an intact staple line. The       jejunojejunal anastomosis was characterized by healthy appearing mucosa.       The ulcer previously noted at the gastro-jejunal anatomosis was larger       in size than before. It was clean based on this exam. The scope could       not advanced any further than the extent of the exam due to looping.      There was no evidence of significant pathology in the entire examined       portion of jejunum. Impression:            - Normal esophagus.                        - Roux-en-Y gastrojejunostomy with gastrojejunal                         anastomosis characterized by ulceration.                        - The examined portion of the jejunum was normal.                        - No specimens collected.                        -  Given that the anastomotic ulcer has visibly                         increased in size from last upper endoscopy, this is                         the likely source of patient's recurrent melena 3 days                         ago. It was clean based and did not need any                         therapeutic intervention today. Recommendation:        - Change PPI from protonix to omeprazole 40mg  BID                         since previously seen ulcer has increased in size.                        - Continue Sucralfate four times a day                        - Pt has not had any melena in 2 days. Continue                         expectant management if no further episodes of                         bleeding occur. If active bleeding occurs, pt advised                         to come to the  ER. If rebleeding occurs, further steps                         may include CTA, RBC scan, small bowel capsule study                         depending on clinical symptoms. Pt may need surgical                         referral to discuss revision surgery if anastamotic                         ulceration does not heal as expected                        - The findings and recommendations were discussed with                         the patient.                        - The findings and recommendations were discussed with                         the patient's family.                        -  Return to my office in 2 weeks.                        - Avoid NSAIDs except Aspirin if medically indicated                         by PCP Procedure Code(s):     --- Professional ---                        [phone], Small intestinal endoscopy, enteroscopy beyond                         second portion of duodenum, not including ileum;                         diagnostic, including collection of specimen(s) by                         brushing or washing, when performed (separate                         procedure) Diagnosis Code(s):     --- Professional ---                        Z98.0, Intestinal bypass and anastomosis status                        D62, Acute posthemorrhagic anemia                        K92.1, Melena (includes Hematochezia) CPT copyright 2019 American Medical Association. All rights reserved. The codes documented in this report are preliminary and upon coder review may  be revised to meet current compliance requirements.  [name], MD Margretta Sidle B. Bonna Gains MD, MD 01/23/2021 12:33:41 PM This report has been signed electronically. Number of Addenda: 0 Note Initiated On: 01/23/2021 11:40 AM Estimated Blood Loss:  Estimated blood loss: none.      [facility]

## 2021-01-23 NOTE — Anesthesia Preprocedure Evaluation (Signed)
Anesthesia Evaluation  Patient identified by MRN, date of birth, ID band Patient awake    History of Anesthesia Complications (+) PONV, Family history of anesthesia reaction and history of anesthetic complications (history of maternal aunt with some type of complication, patient not able to give details but sounds like related to underlying pulmonary disease)  Airway Mallampati: II  TM Distance: >3 FB Neck ROM: Full    Dental no notable dental hx. (+) Chipped   Pulmonary    Pulmonary exam normal        Cardiovascular Exercise Tolerance: Good hypertension, Normal cardiovascular exam     Neuro/Psych  Headaches,    GI/Hepatic negative GI ROS, Neg liver ROS,   Endo/Other  Morbid obesity (BMI 41)  Renal/GU negative Renal ROS     Musculoskeletal   Abdominal   Peds  Hematology negative hematology ROS (+)   Anesthesia Other Findings Past Medical History: 06/04/2012: Abnormal glucose tolerance test in pregnancy, antepartum     Comment:  Formatting of this note might be different from the               original. Needs 3h GTT No date: B12 deficiency No date: Family history of adverse reaction to anesthesia     Comment:  MATERNAL AUNT-PT UNAWARE OF WHAT HAPPENED WITH               ANESTHESIA BUT STATES HER AUNT HAS HAD PROBLEMS IN PAST  No date: Fecal occult blood test positive No date: Genital herpes 04/19/2012: H/O cesarean section complicating pregnancy 5/68/1275: Hair thinning No date: Headache     Comment:  MIGRAINE 10/26/2018: Heartburn No date: Hyperlipidemia No date: Hypertension No date: Migraine No date: Obesity No date: PONV (postoperative nausea and vomiting) 04-2015: Syncope No date: Vitamin D deficiency 02/23/2019: Yeast vaginitis     Comment:  Candida albicans on culture   Reproductive/Obstetrics                             Anesthesia Physical  Anesthesia Plan  ASA:  III  Anesthesia Plan: General   Post-op Pain Management:    Induction: Intravenous  PONV Risk Score and Plan: 4 or greater and Propofol infusion, TIVA and Treatment may vary due to age or medical condition  Airway Management Planned: Nasal Cannula and Natural Airway  Additional Equipment: None  Intra-op Plan:   Post-operative Plan:   Informed Consent: I have reviewed the patients History and Physical, chart, labs and discussed the procedure including the risks, benefits and alternatives for the proposed anesthesia with the patient or authorized representative who has indicated his/her understanding and acceptance.     Dental Advisory Given  Plan Discussed with: CRNA  Anesthesia Plan Comments: (Patient consented for risks of anesthesia including but not limited to:  - adverse reactions to medications - risk of airway placement if required - damage to eyes, teeth, lips or other oral mucosa - nerve damage due to positioning  - sore throat or hoarseness - Damage to heart, brain, nerves, lungs, other parts of body or loss of life  Patient voiced understanding.)        Anesthesia Quick Evaluation

## 2021-01-24 ENCOUNTER — Other Ambulatory Visit: Payer: Self-pay | Admitting: Gastroenterology

## 2021-01-24 ENCOUNTER — Encounter: Payer: Self-pay | Admitting: Gastroenterology

## 2021-01-24 NOTE — Anesthesia Postprocedure Evaluation (Signed)
Anesthesia Post Note  Patient: Jean Pitts  Procedure(s) Performed: ENTEROSCOPY (N/A )  Patient location during evaluation: Endoscopy Anesthesia Type: General Level of consciousness: awake and alert Pain management: pain level controlled Vital Signs Assessment: post-procedure vital signs reviewed and stable Respiratory status: spontaneous breathing, nonlabored ventilation, respiratory function stable and patient connected to nasal cannula oxygen Cardiovascular status: blood pressure returned to baseline and stable Postop Assessment: no apparent nausea or vomiting Anesthetic complications: no   No complications documented.   Last Vitals:  Vitals:   01/23/21 1054 01/23/21 1216  BP: 133/75 124/85  Pulse: 64   Resp: 18   Temp: 36.6 C (!) 35.9 C  SpO2: 98%     Last Pain:  Vitals:   01/23/21 1216  TempSrc: Temporal  PainSc:                  Precious Haws Zainah Steven

## 2021-01-27 ENCOUNTER — Other Ambulatory Visit: Payer: Self-pay | Admitting: Primary Care

## 2021-01-27 DIAGNOSIS — G43009 Migraine without aura, not intractable, without status migrainosus: Secondary | ICD-10-CM

## 2021-01-29 ENCOUNTER — Other Ambulatory Visit: Payer: Self-pay

## 2021-01-29 DIAGNOSIS — D519 Vitamin B12 deficiency anemia, unspecified: Secondary | ICD-10-CM

## 2021-01-30 ENCOUNTER — Encounter: Payer: Self-pay | Admitting: Family Medicine

## 2021-01-30 ENCOUNTER — Ambulatory Visit: Payer: BLUE CROSS/BLUE SHIELD | Admitting: Family Medicine

## 2021-01-30 ENCOUNTER — Telehealth: Payer: Self-pay | Admitting: *Deleted

## 2021-01-30 ENCOUNTER — Encounter (HOSPITAL_COMMUNITY): Payer: Self-pay

## 2021-01-30 ENCOUNTER — Other Ambulatory Visit: Payer: Self-pay

## 2021-01-30 ENCOUNTER — Inpatient Hospital Stay (HOSPITAL_COMMUNITY)
Admission: EM | Admit: 2021-01-30 | Discharge: 2021-02-04 | DRG: 812 | Disposition: A | Discharge status: Home / Self Care | Payer: BLUE CROSS/BLUE SHIELD | Attending: Internal Medicine | Admitting: Internal Medicine

## 2021-01-30 VITALS — BP 120/84 | HR 92 | Temp 98.4°F | Ht 64.0 in | Wt 234.0 lb

## 2021-01-30 DIAGNOSIS — R000 Tachycardia, unspecified: Secondary | ICD-10-CM | POA: Diagnosis present

## 2021-01-30 DIAGNOSIS — Z20822 Contact with and (suspected) exposure to covid-19: Secondary | ICD-10-CM | POA: Diagnosis present

## 2021-01-30 DIAGNOSIS — Z888 Allergy status to other drugs, medicaments and biological substances status: Secondary | ICD-10-CM

## 2021-01-30 DIAGNOSIS — D649 Anemia, unspecified: Secondary | ICD-10-CM | POA: Diagnosis present

## 2021-01-30 DIAGNOSIS — D509 Iron deficiency anemia, unspecified: Principal | ICD-10-CM | POA: Diagnosis present

## 2021-01-30 DIAGNOSIS — Z9884 Bariatric surgery status: Secondary | ICD-10-CM

## 2021-01-30 DIAGNOSIS — Z8041 Family history of malignant neoplasm of ovary: Secondary | ICD-10-CM

## 2021-01-30 DIAGNOSIS — T183XXA Foreign body in small intestine, initial encounter: Secondary | ICD-10-CM

## 2021-01-30 DIAGNOSIS — K284 Chronic or unspecified gastrojejunal ulcer with hemorrhage: Secondary | ICD-10-CM

## 2021-01-30 DIAGNOSIS — D508 Other iron deficiency anemias: Secondary | ICD-10-CM

## 2021-01-30 DIAGNOSIS — K259 Gastric ulcer, unspecified as acute or chronic, without hemorrhage or perforation: Secondary | ICD-10-CM | POA: Diagnosis present

## 2021-01-30 DIAGNOSIS — K921 Melena: Secondary | ICD-10-CM | POA: Diagnosis present

## 2021-01-30 DIAGNOSIS — E559 Vitamin D deficiency, unspecified: Secondary | ICD-10-CM

## 2021-01-30 DIAGNOSIS — Z83438 Family history of other disorder of lipoprotein metabolism and other lipidemia: Secondary | ICD-10-CM

## 2021-01-30 DIAGNOSIS — Z79899 Other long term (current) drug therapy: Secondary | ICD-10-CM

## 2021-01-30 DIAGNOSIS — R55 Syncope and collapse: Secondary | ICD-10-CM | POA: Diagnosis not present

## 2021-01-30 DIAGNOSIS — D519 Vitamin B12 deficiency anemia, unspecified: Secondary | ICD-10-CM

## 2021-01-30 DIAGNOSIS — K289 Gastrojejunal ulcer, unspecified as acute or chronic, without hemorrhage or perforation: Secondary | ICD-10-CM

## 2021-01-30 DIAGNOSIS — G43709 Chronic migraine without aura, not intractable, without status migrainosus: Secondary | ICD-10-CM | POA: Diagnosis present

## 2021-01-30 DIAGNOSIS — Z885 Allergy status to narcotic agent status: Secondary | ICD-10-CM

## 2021-01-30 DIAGNOSIS — G43009 Migraine without aura, not intractable, without status migrainosus: Secondary | ICD-10-CM | POA: Diagnosis present

## 2021-01-30 DIAGNOSIS — I10 Essential (primary) hypertension: Secondary | ICD-10-CM | POA: Diagnosis present

## 2021-01-30 DIAGNOSIS — Z6841 Body Mass Index (BMI) 40.0 and over, adult: Secondary | ICD-10-CM

## 2021-01-30 DIAGNOSIS — I951 Orthostatic hypotension: Secondary | ICD-10-CM | POA: Diagnosis present

## 2021-01-30 DIAGNOSIS — K219 Gastro-esophageal reflux disease without esophagitis: Secondary | ICD-10-CM | POA: Diagnosis present

## 2021-01-30 LAB — CBC WITH DIFFERENTIAL/PLATELET
Abs Immature Granulocytes: 0.03 10*3/uL (ref 0.00–0.07)
Basophils Absolute: 0.1 10*3/uL (ref 0.0–0.1)
Basophils Relative: 1 %
Eosinophils Absolute: 0.2 10*3/uL (ref 0.0–0.5)
Eosinophils Relative: 1 %
HCT: 33.6 % — ABNORMAL LOW (ref 36.0–46.0)
Hemoglobin: 10.6 g/dL — ABNORMAL LOW (ref 12.0–15.0)
Immature Granulocytes: 0 %
Lymphocytes Relative: 21 %
Lymphs Abs: 2.2 10*3/uL (ref 0.7–4.0)
MCH: 27 pg (ref 26.0–34.0)
MCHC: 31.5 g/dL (ref 30.0–36.0)
MCV: 85.7 fL (ref 80.0–100.0)
Monocytes Absolute: 0.7 10*3/uL (ref 0.1–1.0)
Monocytes Relative: 7 %
Neutro Abs: 7.3 10*3/uL (ref 1.7–7.7)
Neutrophils Relative %: 70 %
Platelets: 503 10*3/uL — ABNORMAL HIGH (ref 150–400)
RBC: 3.92 MIL/uL (ref 3.87–5.11)
RDW: 13.5 % (ref 11.5–15.5)
WBC: 10.5 10*3/uL (ref 4.0–10.5)
nRBC: 0 % (ref 0.0–0.2)

## 2021-01-30 LAB — COMPREHENSIVE METABOLIC PANEL
ALT: 32 U/L (ref 0–44)
AST: 36 U/L (ref 15–41)
Albumin: 4.3 g/dL (ref 3.5–5.0)
Alkaline Phosphatase: 100 U/L (ref 38–126)
Anion gap: 7 (ref 5–15)
BUN: 7 mg/dL (ref 6–20)
CO2: 25 mmol/L (ref 22–32)
Calcium: 9.7 mg/dL (ref 8.9–10.3)
Chloride: 105 mmol/L (ref 98–111)
Creatinine, Ser: 0.72 mg/dL (ref 0.44–1.00)
GFR, Estimated: >60 mL/min (ref >60)
Glucose, Bld: 111 mg/dL — ABNORMAL HIGH (ref 70–99)
Potassium: 3.6 mmol/L (ref 3.5–5.1)
Sodium: 137 mmol/L (ref 135–145)
Total Bilirubin: 0.2 mg/dL — ABNORMAL LOW (ref 0.3–1.2)
Total Protein: 8.3 g/dL — ABNORMAL HIGH (ref 6.5–8.1)

## 2021-01-30 LAB — TYPE AND SCREEN
ABO/RH(D): A POS
Antibody Screen: NEGATIVE

## 2021-01-30 NOTE — ED Triage Notes (Addendum)
Pt reports abnormal labs for over a month due to an issue with gastric ulcer. Pt sts medications are not helping. Scheduled tomorrow for iron infusion. Labs on 4/26 show hgb of 8.6 and dropping over the last month. Also c/o dizziness and orthostatic tachycardia earlier today.

## 2021-01-30 NOTE — ED Notes (Signed)
Called to update vital signs no answer. 

## 2021-01-30 NOTE — ED Provider Notes (Signed)
Emergency Medicine Provider Triage Evaluation Note  Jean Pitts , a 35 y.o. female  was evaluated in triage.  Pt complains of GI bleeding.  Has nonbleeding ulcer, hemoglobin has been steadily dropping and she has been feeling more weak and fatigued.  Was seen at PCP office and noted to be tachycardic and orthostatic.  Was previously having melena, is not seeing melena or gross blood in her stool now but is concern for continued bleeding.  Is scheduled for iron infusion tomorrow but was sent to the ED for more acute lab work to determine if she also needs blood transfusion.  Review of Systems  Positive: Abdominal pain, lightheadedness, fatigue Negative: Fever, vomiting, bright red blood per rectum  Physical Exam  BP (!) 155/92 (BP Location: Left Arm)   Pulse (!) 113   Temp 98.6 F (37 C) (Oral)   Resp 16   Ht 5' 3.5" (1.613 m)   Wt 106.1 kg   SpO2 100%   BMI 40.80 kg/m  Gen:   Awake, no distress , pale Resp:  Normal effort  MSK:   Moves extremities without difficulty   Medical Decision Making  Medically screening exam initiated at 7:28 PM.  Appropriate orders placed.  [name] was informed that the remainder of the evaluation will be completed by another provider, this initial triage assessment does not replace that evaluation, and the importance of remaining in the ED until their evaluation is complete.     Jacqlyn Larsen, PA-C 01/30/21 1930    Wyvonnia Dusky, MD 01/31/21 1155

## 2021-01-30 NOTE — Progress Notes (Signed)
[name]. [name], MD, [place]  Primary Care and Sports Medicine [facility] at [facility] [place], [address]  Phone: [phone]  FAX: [phone]  Jean Pitts - 35 y.o. female  MRN [id number]  Date of Birth: September 05, 1986  Date: 01/30/2021  PCP: Pleas Koch, NP  Referral: Pleas Koch, NP  Chief Complaint  Patient presents with  . Hypertension    This visit occurred during the SARS-CoV-2 public health emergency.  Safety protocols were in place, including screening questions prior to the visit, additional usage of staff PPE, and extensive cleaning of exam room while observing appropriate contact time as indicated for disinfecting solutions.   Subjective:   Jean Pitts is a 35 y.o. very pleasant female patient with Body mass index is 40.17 kg/m. who presents with the following:  BP Readings from Last 3 Encounters:  01/30/21 120/84  01/23/21 124/85  01/22/21 111/79   She has a known GI bleed.  Her last hemoglobin was 8.  This is from January 18, 2021.  A gastrojejunal ulcer was visualized on endoscopy.  She is currently on a PPI, and she is scheduled for an iron infusion tomorrow.  Her most recent hemoglobin was 4.  Within the last 24 to 36 hours she is having worsening dizziness and she did have some frank syncope where she completely blacked out.  She is getting tachycardic and has been doing so but worsening over the last few weeks.  She is felt her heart rate get up well above 1 40-1 50.  At times, her tachycardia will last up to 30 minutes.  Every time she goes up the stairs she will get presyncopal and her pulse will increase.  She is accompanied by her mother who provides additional history.  At this time she is not driving, and she has been limited to working at home due to the symptoms.  She has a history of gastric bypass, and she had a push enteroscopy on January 23, 2021.  Hemoglobin is  8. Iron infusion is being wet up. Ferritin is 4  S/p bariatric surgery  On PPI.  Vit D is very low.  Certain times where will get feeling like she will black out. Heart felt like it was going out of her chest without any provocation.   She does not have any significant history of depression or anxiety per her report to me today No drugs No alcohol No smoking  She had a cardiac work-up about 5 years ago that was normal.  30 mins  Review of Systems is noted in the HPI, as appropriate  Patient Active Problem List   Diagnosis Date Noted  . Preventative health care 01/17/2021  . Dizziness 01/17/2021  . Melena   . Ulcer at site of surgical anastomosis following bypass of stomach   . Polyp of ascending colon   . Black tarry stools 12/20/2020  . Generalized abdominal pain 12/20/2020  . Bunion of right foot 11/02/2018  . Hyperlipidemia 10/26/2018  . Morbid obesity ([place]) 06/04/2016  . PCOS (polycystic ovarian syndrome) 06/04/2016  . S/P bariatric surgery 05/25/2013  . Absolute anemia 10/22/2012  . Migraine without aura and without status migrainosus, not intractable 12/04/2011  . Migraine headache 12/04/2011    Past Medical History:  Diagnosis Date  . Abnormal glucose tolerance test in pregnancy, antepartum 06/04/2012   Formatting of this note might be different from the original. Needs 3h GTT  . B12  deficiency   . Family history of adverse reaction to anesthesia    MATERNAL AUNT-PT UNAWARE OF WHAT HAPPENED WITH ANESTHESIA BUT STATES HER AUNT HAS HAD PROBLEMS IN PAST   . Fecal occult blood test positive   . Genital herpes   . GERD (gastroesophageal reflux disease)   . H/O cesarean section complicating pregnancy 3/35/4562  . Hair thinning 05/12/2018  . Headache    MIGRAINE  . Heartburn 10/26/2018  . Hyperlipidemia   . Hypertension   . Migraine   . Obesity   . PONV (postoperative nausea and vomiting)   . Syncope 04-2015  . Vitamin D deficiency   . Yeast vaginitis  02/23/2019   Candida albicans on culture    Past Surgical History:  Procedure Laterality Date  . APPENDECTOMY    . BUNIONECTOMY    . CESAREAN SECTION     X2  . COLONOSCOPY WITH PROPOFOL N/A 12/31/2020   Procedure: COLONOSCOPY WITH PROPOFOL. eleview and spot ink tattoo used.;  Surgeon: Virgel Manifold, MD;  Location: [place];  Service: Endoscopy;  Laterality: N/A;  . DIAGNOSTIC LAPAROSCOPY    . ENDOMETRIAL BIOPSY    . ENTEROSCOPY N/A 01/23/2021   Procedure: ENTEROSCOPY;  Surgeon: Virgel Manifold, MD;  [facility] END[facility]COPY;  Service: Endoscopy;  Laterality: N/A;  . ESOPHAGOGASTRODUODENOSCOPY (EGD) WITH PROPOFOL N/A 12/31/2020   Procedure: ESOPHAGOGASTRODUODENOSCOPY (EGD) WITH PROPOFOL;  Surgeon: Virgel Manifold, MD;  Location: [place];  Service: Endoscopy;  Laterality: N/A;  . GASTRIC BYPASS    . LASIK    . MOUTH SURGERY     Tongue Cyst  . NOVASURE ABLATION N/A 06/29/2015   Procedure: DILATION AND CURETTAGE, HYSTEROSCOPY, NOVASURE ABLATION;  Surgeon: Malachy Mood, MD;  Location: [facility] ORS;  Service: Gynecology;  Laterality: N/A;  . TUBAL LIGATION    . WISDOM TOOTH EXTRACTION      Family History  Problem Relation Age of Onset  . Obesity Mother   . Hyperlipidemia Mother   . Obesity Sister   . Obesity Brother   . Obesity Sister   . Ovarian cancer Maternal Grandmother 26       Cervical/Sqammacell     Objective:   BP 120/84   Pulse 92   Temp 98.4 F (36.9 C) (Temporal)   Ht '5\' 4"'  (1.626 m)   Wt 234 lb (106.1 kg)   SpO2 98%   BMI 40.17 kg/m   Vitals:   01/30/21 1455  BP: 120/84  Pulse: 92  Temp: 98.4 F (36.9 C)  TempSrc: Temporal  SpO2: 98%  Weight: 234 lb (106.1 kg)  Height: '5\' 4"'  (1.626 m)   Orthostatic VS for the past 24 hrs:  BP- Lying Pulse- Lying BP- Sitting Pulse- Sitting BP- Standing at 0 minutes Pulse- Standing at 0 minutes  01/30/21 1521 107/78 94 120/77 93 114/87 140     GEN: No acute distress;  alert,appropriate. PULM: Breathing comfortably in no respiratory distress PSYCH: Normally interactive.  CV: RRR, no m/g/r  PULM: Normal respiratory rate, no accessory muscle use. No wheezes, crackles or rhonchi   Laboratory and Imaging Data: Results for orders placed or performed during the hospital encounter of 01/22/21  CBC  Result Value Ref Range   WBC 5.3 4.0 - 10.5 K/uL   RBC 3.18 (L) 3.87 - 5.11 MIL/uL   Hemoglobin 8.6 (L) 12.0 - 15.0 g/dL   HCT 27.4 (L) 36.0 - 46.0 %   MCV 86.2 80.0 - 100.0 fL   MCH 27.0 26.0 -  34.0 pg   MCHC 31.4 30.0 - 36.0 g/dL   RDW 13.2 11.5 - 15.5 %   Platelets 435 (H) 150 - 400 K/uL   nRBC 0.0 0.0 - 0.2 %    Lab Review:  CBC EXTENDED Latest Ref Rng & Units 01/22/2021 01/18/2021 12/27/2020  WBC 4.0 - 10.5 K/uL 5.3 7.1 6.4  RBC 3.87 - 5.11 MIL/uL 3.18(L) 3.41(L) 3.15(L)  HGB 12.0 - 15.0 g/dL 8.6(L) 9.9(L) 9.5(L)  HCT 36.0 - 46.0 % 27.4(L) 28.6(L) 28.8(L)  PLT 150 - 400 K/uL 435(H) 419.0(H) 391  NEUTROABS 1.4 - 7.7 K/uL - - -  LYMPHSABS 0.7 - 4.0 K/uL - - -    BMP Latest Ref Rng & Units 01/18/2021 12/20/2020 06/25/2020  Glucose 70 - 99 mg/dL 81 92 91  BUN 6 - 23 mg/dL '10 20 8  ' Creatinine 0.40 - 1.20 mg/dL 0.64 0.75 0.76  BUN/Creat Ratio 9 - 23 - - 11  Sodium 135 - 145 mEq/L 140 140 140  Potassium 3.5 - 5.1 mEq/L 4.2 5.0 4.3  Chloride 96 - 112 mEq/L 105 107 102  CO2 19 - 32 mEq/L '28 25 26  ' Calcium 8.4 - 10.5 mg/dL 9.2 9.1 9.9    Hepatic Function Latest Ref Rng & Units 01/18/2021 12/20/2020 06/25/2020  Total Protein 6.0 - 8.3 g/dL 6.8 6.9 7.0  Albumin 3.5 - 5.2 g/dL 3.9 4.3 4.3  AST 0 - 37 U/L '20 18 22  ' ALT 0 - 35 U/L '18 14 15  ' Alk Phosphatase 39 - 117 U/L 99 73 117  Total Bilirubin 0.2 - 1.2 mg/dL 0.4 0.2 0.2    Lab Results  Component Value Date   CHOL 204 (H) 01/18/2021   Lab Results  Component Value Date   HDL 51.30 01/18/2021   Lab Results  Component Value Date   LDLCALC 129 (H) 01/18/2021   Lab Results  Component Value Date    TRIG 116.0 01/18/2021   Lab Results  Component Value Date   CHOLHDL 4 01/18/2021   No results for input(s): PSA in the last 72 hours. No results found for: HCVAB Lab Results  Component Value Date   VD25OH 15.07 (L) 01/18/2021   VD25OH 22.3 (L) 06/25/2020   VD25OH 20.4 (L) 04/29/2018     Lab Results  Component Value Date   HGBA1C 5.3 01/18/2021   HGBA1C 5.3 06/25/2020   HGBA1C 5.0 04/29/2018   Lab Results  Component Value Date   LDLCALC 129 (H) 01/18/2021   CREATININE 0.64 01/18/2021   Lab Results  Component Value Date   IRON 30 (L) 01/18/2021   TIBC 440 06/25/2020   FERRITIN 4.1 (L) 01/18/2021    Assessment and Plan:     ICD-10-CM   1. Gastrointestinal hemorrhage associated with gastrojejunal ulcer  K28.4   2. Tachycardia  R00.0 EKG 12-Lead    CANCELED: CBC with Differential/Platelet    CANCELED: Protime-INR    CANCELED: CBC with Differential/Platelet    CANCELED: Protime-INR  3. Other iron deficiency anemia  D50.8 CANCELED: CBC with Differential/Platelet    CANCELED: Protime-INR    CANCELED: CBC with Differential/Platelet    CANCELED: Protime-INR  4. Syncope and collapse  R55 CANCELED: CBC with Differential/Platelet    CANCELED: Protime-INR    CANCELED: CBC with Differential/Platelet    CANCELED: Protime-INR  5. History of gastric bypass  Z98.84    In a setting of frank syncope with a known GI bleed, and tachycardia to 150 in the office with reported history of  tachycardia to 150 lasting up to 30 minutes.  I think that this deserves additional work-up, greater than what we can do in the office.  With frank syncope and a hemoglobin of 8, I think that a blood transfusion would likely provide her some relief very quickly.  Also think it probably be helpful to have her be on a monitor, and she is going to the ER right now by private vehicle.  Orders Placed This Encounter  Procedures  . EKG 12-Lead    Follow-up: No follow-ups on file.  Signed,  Maud Deed. Tinleigh Whitmire, MD   Outpatient Encounter Medications as of 01/30/2021  Medication Sig  . ferrous sulfate 325 (65 FE) MG tablet Take 2 tablets by mouth in the morning and at bedtime.  . fluconazole (DIFLUCAN) 150 MG tablet Take 1 tablet (150 mg total) by mouth once a week. As preventive  . omeprazole (PRILOSEC) 40 MG capsule Take 1 capsule (40 mg total) by mouth in the morning and at bedtime.  . propranolol ER (INDERAL LA) 80 MG 24 hr capsule TAKE 1 CAPSULE (80 MG TOTAL) BY MOUTH DAILY. FOR HEADACHE PREVENTION.  . sucralfate (CARAFATE) 1 GM/10ML suspension TAKE 10 MLS (1 G TOTAL) BY MOUTH 4 (FOUR) TIMES DAILY.  . SUMAtriptan Succinate Refill 6 MG/0.5ML SOCT Inject into the skin.  . Vitamin D, Ergocalciferol, (DRISDOL) 1.25 MG (50000 UNIT) CAPS capsule Take 1 capsule by mouth once weekly for 12 weeks.   No facility-administered encounter medications on file as of 01/30/2021.

## 2021-01-30 NOTE — ED Notes (Signed)
Per Dr Edilia Bo, patient has a "slow GI bleed"-history of gastric by pass-Hgb 8, elevated HR

## 2021-01-30 NOTE — Telephone Encounter (Signed)
Patient called stating that she has had a lot of health issues going on this past month. Patient stated that she is seeing Allie Bossier NP and a GI doctor. Patient stated that she is scheduled for an iron infusion tomorrow. Patient stated that she has been having a lot of dizzy spells this morning. Patient stated that her BP was 133/81 and 124/77 heart rate 112.  Patient stated that she does have some chest tightness but no SOB. Patient stated that her heart has been racing today and she feels at times it is going to pop out of her chest. Patient was advised that Anda Kraft is out of the office this week but I can work her in with another provider if she would like me to. Patient scheduled to see Dr. Lorelei Pont today 01/30/21 at 2:40 pm. Patient had a negative covid screening. Patient was advised that she should not be driving with having dizzy spells. Patient stated that she will have her mom drive her. Patient was given ER precautions and she verbalized understanding.

## 2021-01-31 ENCOUNTER — Encounter (HOSPITAL_COMMUNITY): Payer: BLUE CROSS/BLUE SHIELD

## 2021-01-31 ENCOUNTER — Encounter (HOSPITAL_COMMUNITY): Payer: Self-pay | Admitting: Internal Medicine

## 2021-01-31 ENCOUNTER — Emergency Department (HOSPITAL_COMMUNITY): Payer: BLUE CROSS/BLUE SHIELD

## 2021-01-31 ENCOUNTER — Other Ambulatory Visit: Payer: Self-pay | Admitting: Gastroenterology

## 2021-01-31 DIAGNOSIS — I951 Orthostatic hypotension: Secondary | ICD-10-CM | POA: Diagnosis not present

## 2021-01-31 DIAGNOSIS — D649 Anemia, unspecified: Secondary | ICD-10-CM | POA: Diagnosis present

## 2021-01-31 LAB — CBC
HCT: 29.2 % — ABNORMAL LOW (ref 36.0–46.0)
Hemoglobin: 9.1 g/dL — ABNORMAL LOW (ref 12.0–15.0)
MCH: 26.7 pg (ref 26.0–34.0)
MCHC: 31.2 g/dL (ref 30.0–36.0)
MCV: 85.6 fL (ref 80.0–100.0)
Platelets: 430 10*3/uL — ABNORMAL HIGH (ref 150–400)
RBC: 3.41 MIL/uL — ABNORMAL LOW (ref 3.87–5.11)
RDW: 13.5 % (ref 11.5–15.5)
WBC: 8.1 10*3/uL (ref 4.0–10.5)
nRBC: 0 % (ref 0.0–0.2)

## 2021-01-31 LAB — IRON AND TIBC
Iron: 29 ug/dL (ref 28–170)
Saturation Ratios: 6 % — ABNORMAL LOW (ref 10.4–31.8)
TIBC: 468 ug/dL — ABNORMAL HIGH (ref 250–450)
UIBC: 439 ug/dL

## 2021-01-31 LAB — HEMOGLOBIN AND HEMATOCRIT, BLOOD
HCT: 30 % — ABNORMAL LOW (ref 36.0–46.0)
Hemoglobin: 9.3 g/dL — ABNORMAL LOW (ref 12.0–15.0)

## 2021-01-31 LAB — SARS CORONAVIRUS 2 (TAT 6-24 HRS): SARS Coronavirus 2: NEGATIVE

## 2021-01-31 LAB — FERRITIN: Ferritin: 5 ng/mL — ABNORMAL LOW (ref 11–307)

## 2021-01-31 LAB — ABO/RH: ABO/RH(D): A POS

## 2021-01-31 IMAGING — DX DG CHEST 1V PORT
1 series · 1 of 1 positions shown · non-contrast
Comparison: None.

CLINICAL DATA: Palpitations

EXAM:
PORTABLE CHEST 1 VIEW

[chest ap]
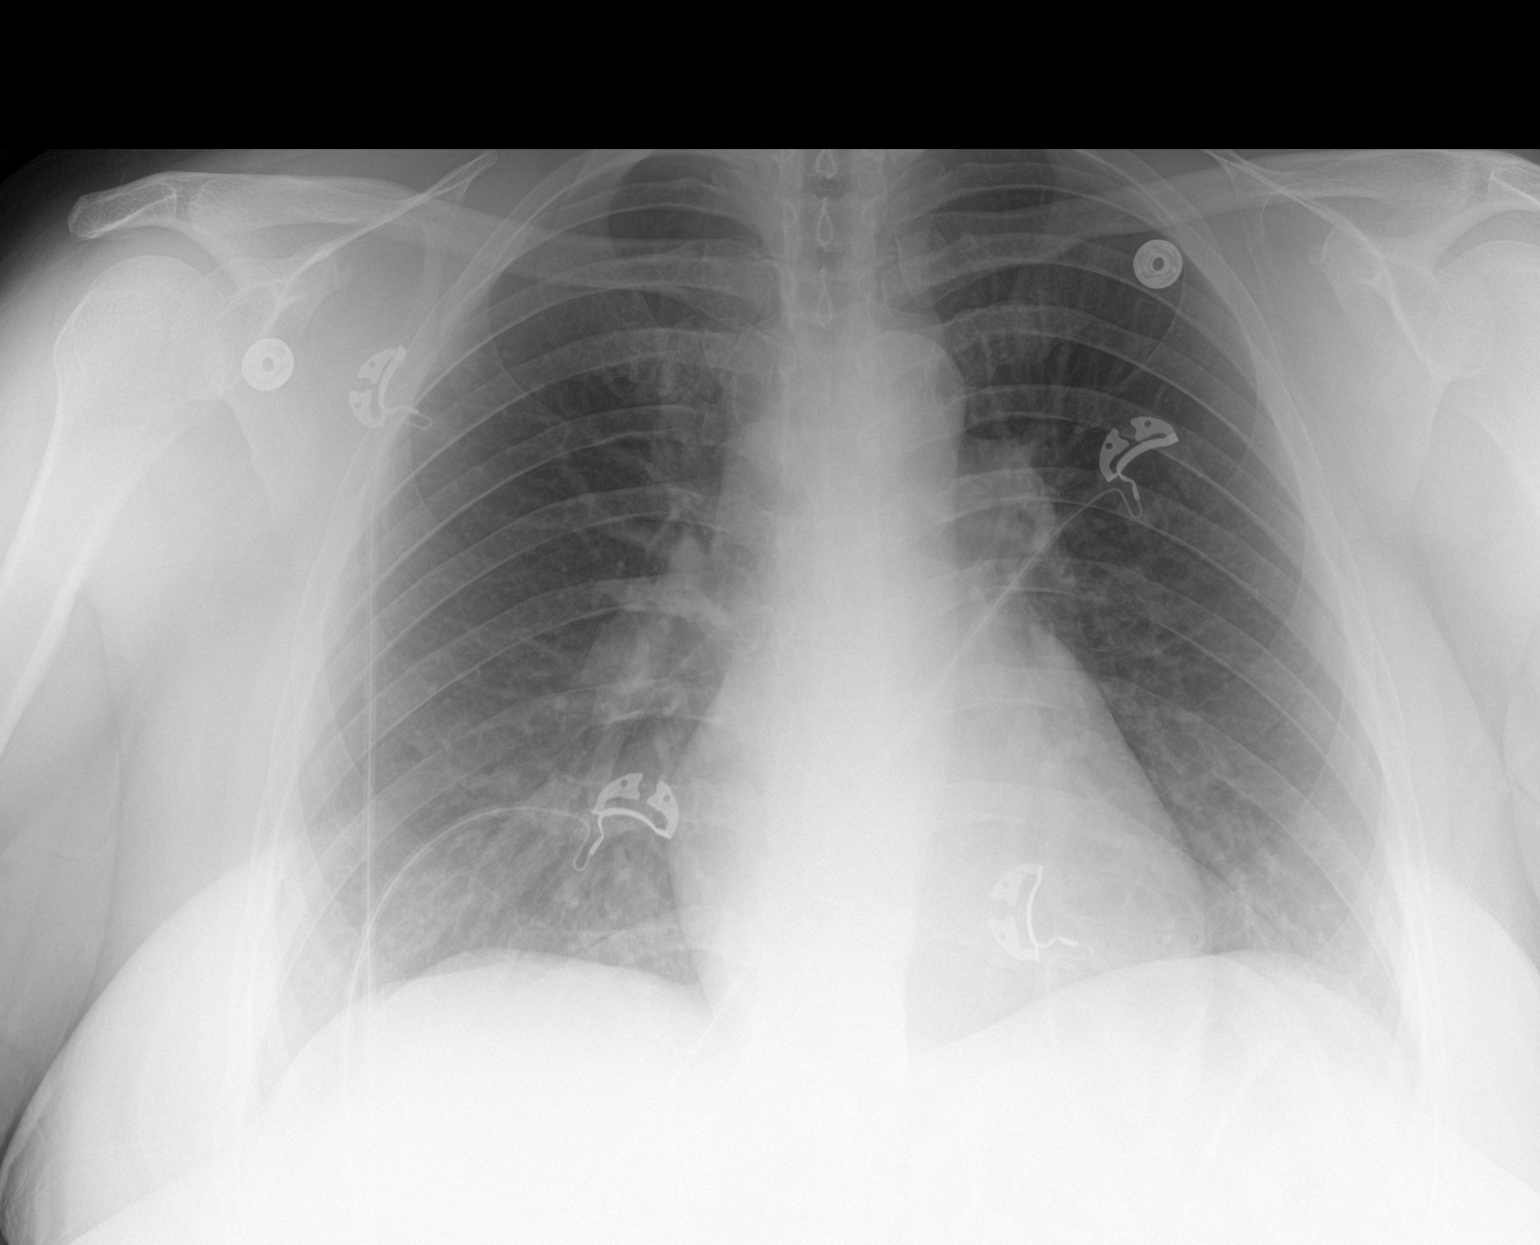

[1 of 1 positions shown; findings below may reference images not displayed]

FINDINGS: Normal heart size and mediastinal contours. No acute infiltrate or
edema. No effusion or pneumothorax. No acute osseous findings.
IMPRESSION: Negative chest.

## 2021-01-31 MED ORDER — FERROUS SULFATE 325 (65 FE) MG PO TABS
650.0000 mg | ORAL_TABLET | Freq: Two times a day (BID) | ORAL | Status: DC
  Administered 2021-01-31 – 2021-02-03 (×7): 650 mg via ORAL
  Filled 2021-01-31 (×8): qty 2

## 2021-01-31 MED ORDER — SUMATRIPTAN SUCCINATE 6 MG/0.5ML ~~LOC~~ SOLN
6.0000 mg | Freq: Every day | SUBCUTANEOUS | Status: DC | PRN
Start: 2021-01-31 — End: 2021-02-03
  Administered 2021-02-03: 6 mg via SUBCUTANEOUS
  Filled 2021-01-31 (×2): qty 0.5

## 2021-01-31 MED ORDER — SODIUM CHLORIDE 0.9 % IV BOLUS
1000.0000 mL | Freq: Once | INTRAVENOUS | Status: AC
  Administered 2021-01-31: 1000 mL via INTRAVENOUS

## 2021-01-31 MED ORDER — SODIUM CHLORIDE 0.9 % IV SOLN: INTRAVENOUS | Status: DC

## 2021-01-31 MED ORDER — PROPRANOLOL HCL ER 80 MG PO CP24
80.0000 mg | ORAL_CAPSULE | Freq: Every day | ORAL | Status: DC
  Administered 2021-01-31 – 2021-02-04 (×4): 80 mg via ORAL
  Filled 2021-01-31 (×5): qty 1

## 2021-01-31 MED ORDER — ESOMEPRAZOLE MAGNESIUM 40 MG PO CPDR: 40.0000 mg | DELAYED_RELEASE_CAPSULE | Freq: Two times a day (BID) | ORAL | 0 refills | Status: DC

## 2021-01-31 MED ORDER — PANTOPRAZOLE SODIUM 40 MG PO TBEC
40.0000 mg | DELAYED_RELEASE_TABLET | Freq: Two times a day (BID) | ORAL | Status: DC
  Administered 2021-01-31: 40 mg via ORAL
  Filled 2021-01-31: qty 1

## 2021-01-31 MED ORDER — SODIUM CHLORIDE 0.9 % IV SOLN
510.0000 mg | Freq: Once | INTRAVENOUS | Status: AC
  Administered 2021-01-31: 510 mg via INTRAVENOUS
  Filled 2021-01-31: qty 510

## 2021-01-31 MED ORDER — ACETAMINOPHEN 500 MG PO TABS
1000.0000 mg | ORAL_TABLET | Freq: Once | ORAL | Status: AC
  Administered 2021-01-31: 1000 mg via ORAL
  Filled 2021-01-31: qty 2

## 2021-01-31 MED ORDER — SUCRALFATE 1 GM/10ML PO SUSP
1.0000 g | Freq: Three times a day (TID) | ORAL | Status: DC
  Administered 2021-01-31 – 2021-02-04 (×14): 1 g via ORAL
  Filled 2021-01-31 (×17): qty 10

## 2021-01-31 NOTE — ED Provider Notes (Signed)
[place] DEPT Provider Note   CSN: [id number] Arrival date & time: 01/30/21  1907     History Chief Complaint  Patient presents with  . Abnormal Lab    Jean Pitts is a 35 y.o. female.  Patient presents to the emergency department with a chief complaint of dizziness.  She has history of gastric ulcers and GI bleed.  She was sent to the emergency department for evaluation of anemia.  She reports that her hemoglobin was 8.6 on 4/26.  She was seen by her doctor today, and was noted to be tachycardic and dizzy.  She was sent to the emergency department for possible blood transfusion.  Patient states that she only gets dizzy when she stands up.  She denies having chest pain or shortness of breath.  She denies having any abdominal pain.  She states that she is scheduled to have an iron transfusion in the morning.  The history is provided by the patient. No language interpreter was used.       Past Medical History:  Diagnosis Date  . Abnormal glucose tolerance test in pregnancy, antepartum 06/04/2012   Formatting of this note might be different from the original. Needs 3h GTT  . B12 deficiency   . Family history of adverse reaction to anesthesia    MATERNAL AUNT-PT UNAWARE OF WHAT HAPPENED WITH ANESTHESIA BUT STATES HER AUNT HAS HAD PROBLEMS IN PAST   . Fecal occult blood test positive   . Genital herpes   . GERD (gastroesophageal reflux disease)   . H/O cesarean section complicating pregnancy 01/05/8118  . Hair thinning 05/12/2018  . Headache    MIGRAINE  . Heartburn 10/26/2018  . Hyperlipidemia   . Hypertension   . Migraine   . Obesity   . PONV (postoperative nausea and vomiting)   . Syncope 04-2015  . Vitamin D deficiency   . Yeast vaginitis 02/23/2019   Candida albicans on culture    Patient Active Problem List   Diagnosis Date Noted  . Preventative health care 01/17/2021  . Dizziness 01/17/2021  . Melena   . Ulcer at site of surgical  anastomosis following bypass of stomach   . Polyp of ascending colon   . Black tarry stools 12/20/2020  . Generalized abdominal pain 12/20/2020  . Bunion of right foot 11/02/2018  . Hyperlipidemia 10/26/2018  . Morbid obesity ([place]) 06/04/2016  . PCOS (polycystic ovarian syndrome) 06/04/2016  . S/P bariatric surgery 05/25/2013  . Absolute anemia 10/22/2012  . Migraine without aura and without status migrainosus, not intractable 12/04/2011  . Migraine headache 12/04/2011    Past Surgical History:  Procedure Laterality Date  . APPENDECTOMY    . BUNIONECTOMY    . CESAREAN SECTION     X2  . COLONOSCOPY WITH PROPOFOL N/A 12/31/2020   Procedure: COLONOSCOPY WITH PROPOFOL. eleview and spot ink tattoo used.;  Surgeon: Virgel Manifold, MD;  Location: [facility];  Service: Endoscopy;  Laterality: N/A;  . DIAGNOSTIC LAPAROSCOPY    . ENDOMETRIAL BIOPSY    . ENTEROSCOPY N/A 01/23/2021   Procedure: ENTEROSCOPY;  Surgeon: Virgel Manifold, MD;  Location: [facility];  Service: Endoscopy;  Laterality: N/A;  . ESOPHAGOGASTRODUODENOSCOPY (EGD) WITH PROPOFOL N/A 12/31/2020   Procedure: ESOPHAGOGASTRODUODENOSCOPY (EGD) WITH PROPOFOL;  Surgeon: Virgel Manifold, MD;  Location: [facility];  Service: Endoscopy;  Laterality: N/A;  . GASTRIC BYPASS    . LASIK    . MOUTH SURGERY     Tongue  Cyst  . NOVASURE ABLATION N/A 06/29/2015   Procedure: DILATION AND CURETTAGE, HYSTEROSCOPY, NOVASURE ABLATION;  Surgeon: Malachy Mood, MD;  Location: [facility] ORS;  Service: Gynecology;  Laterality: N/A;  . TUBAL LIGATION    . WISDOM TOOTH EXTRACTION       OB History    Gravida  2   Para  2   Term  2   Preterm  0   AB  0   Living  2     SAB      IAB      Ectopic      Multiple      Live Births           Obstetric Comments  Both cesarean for failure to progress First child pre-eclampsia        Family History  Problem Relation Age of Onset  . Obesity Mother    . Hyperlipidemia Mother   . Obesity Sister   . Obesity Brother   . Obesity Sister   . Ovarian cancer Maternal Grandmother 26       Cervical/Sqammacell    Social History   Tobacco Use  . Smoking status: Never Smoker  . Smokeless tobacco: Never Used  Vaping Use  . Vaping Use: Never used  Substance Use Topics  . Alcohol use: Yes    Comment: RARE  . Drug use: No    Home Medications Prior to Admission medications   Medication Sig Start Date End Date Taking? Authorizing Provider  ferrous sulfate 325 (65 FE) MG tablet Take 2 tablets by mouth in the morning and at bedtime.   Yes [provider]  fluconazole (DIFLUCAN) 150 MG tablet Take 1 tablet (150 mg total) by mouth once a week. As preventive 02/28/59  Yes Copland, Deirdre Evener, PA-C  omeprazole (PRILOSEC) 40 MG capsule Take 1 capsule (40 mg total) by mouth in the morning and at bedtime. 01/23/21 03/24/21 Yes Virgel Manifold, MD  propranolol ER (INDERAL LA) 80 MG 24 hr capsule TAKE 1 CAPSULE (80 MG TOTAL) BY MOUTH DAILY. FOR HEADACHE PREVENTION. 01/20/21  Yes Pleas Koch, NP  sucralfate (CARAFATE) 1 GM/10ML suspension TAKE 10 MLS (1 G TOTAL) BY MOUTH 4 (FOUR) TIMES DAILY. 01/30/21 03/01/21 Yes Virgel Manifold, MD  SUMAtriptan Succinate Refill 6 MG/0.5ML SOCT Inject 0.5 mLs into the skin daily as needed (migraines).   Yes [provider]  Vitamin D, Ergocalciferol, (DRISDOL) 1.25 MG (50000 UNIT) CAPS capsule Take 1 capsule by mouth once weekly for 12 weeks. Patient taking differently: Take 50,000 Units by mouth every 7 (seven) days. Wednesday 01/18/21  Yes Pleas Koch, NP    Allergies    Codeine, Hydrocodone-acetaminophen, and Vicodin [hydrocodone-acetaminophen]  Review of Systems   Review of Systems  All other systems reviewed and are negative.   Physical Exam Updated Vital Signs BP (!) 116/100   Pulse 87   Temp 98.6 F (37 C) (Oral)   Resp 19   Ht 5' 3.5" (1.613 m)   Wt 106.1 kg   SpO2 96%    BMI 40.80 kg/m   Physical Exam Vitals and nursing note reviewed.  Constitutional:      General: She is not in acute distress.    Appearance: She is well-developed.  HENT:     Head: Normocephalic and atraumatic.  Eyes:     Conjunctiva/sclera: Conjunctivae normal.  Cardiovascular:     Rate and Rhythm: Normal rate and regular rhythm.     Heart sounds: No  murmur heard.   Pulmonary:     Effort: Pulmonary effort is normal. No respiratory distress.     Breath sounds: Normal breath sounds.  Abdominal:     Palpations: Abdomen is soft.     Tenderness: There is no abdominal tenderness.  Musculoskeletal:     Cervical back: Neck supple.  Skin:    General: Skin is warm and dry.  Neurological:     Mental Status: She is alert and oriented to person, place, and time.  Psychiatric:        Mood and Affect: Mood normal.        Behavior: Behavior normal.     ED Results / Procedures / Treatments   Labs (all labs ordered are listed, but only abnormal results are displayed) Labs Reviewed  COMPREHENSIVE METABOLIC PANEL - Abnormal; Notable for the following components:      Result Value   Glucose, Bld 111 (*)    Total Protein 8.3 (*)    Total Bilirubin 0.2 (*)    All other components within normal limits  CBC WITH DIFFERENTIAL/PLATELET - Abnormal; Notable for the following components:   Hemoglobin 10.6 (*)    HCT 33.6 (*)    Platelets 503 (*)    All other components within normal limits  POC OCCULT BLOOD, ED  TYPE AND SCREEN    EKG None  Radiology No results found.  Procedures Procedures   Medications Ordered in ED Medications  sodium chloride 0.9 % bolus 1,000 mL (1,000 mLs Intravenous New Bag/Given 01/31/21 0435)  sodium chloride 0.9 % bolus 1,000 mL (1,000 mLs Intravenous New Bag/Given 01/31/21 0436)  acetaminophen (TYLENOL) tablet 1,000 mg (1,000 mg Oral Given 01/31/21 0349)    ED Course  I have reviewed the triage vital signs and the nursing notes.  Pertinent labs &  imaging results that were available during my care of the patient were reviewed by me and considered in my medical decision making (see chart for details).  Clinical Course as of 01/31/21 0646  Thu Jan 31, 2021  4034 Repeat hgb. Sent in for slow GI bleed and dizziness. If hgb stable and no longer tachycardia with orthostatics can go home. If symptomatic admit.  [MV]    Clinical Course User Index [MV] Eustaquio Maize, PA-C   MDM Rules/Calculators/A&P                          Patient here with dizziness.  She was sent in for possible anemia/symptomatic anemia and thought to maybe require blood transfusion.  Today her hemoglobin is 10.6, this is improved from 8.6 about 10 days ago.  Patient states that she gets very dizzy and has racing heartbeat when she stands up.  When I stood the patient up, she had sinus tachycardia to the 130s.  No diagnosis of POTS, but this is considered.  Patient given several liters of fluids.  Will recheck hemoglobin.  Patient will need to ambulate, if she remains tachycardic and dizzy, will likely need admission.  If feeling better, would likely benefit from outpatient cardiology follow-up.   Appreciate Alroy Bailiff, PA-C, for continuing care.  Final Clinical Impression(s) / ED Diagnoses Final diagnoses:  None    Rx / DC Orders ED Discharge Orders    None       Montine Circle, PA-C 01/31/21 7425    Ezequiel Essex, MD 01/31/21 [phone]

## 2021-01-31 NOTE — ED Notes (Signed)
IV attempt x2 by this RN. Jenel Lucks, RN will attempt and then IV team will be consulted if unsuccessful

## 2021-01-31 NOTE — ED Notes (Signed)
Jenel Lucks, RN unsuccessful with IV attempt. I-Li RN will attempt US guided IV while awaiting IV team

## 2021-01-31 NOTE — ED Provider Notes (Signed)
Care assumed from Montine Circle, PA-C, at shift change, please see their notes for full documentation of patient's complaint/HPI. Briefly, pt here with complaints of dizziness, hx of gastric ulcers and GI bleed. Sent for eval of anemia with Hgb 8.6 on 04/26. Results so far show Hgb 10.6. When patient stood up her HR increased into the 130s. Has received 2 liters of fluid. Awaiting repeat CBC. Plan is to recheck. If hgb stable and pt no longer tachycardic with orthostatics she can go home. If symptomatic needs to be admitted.   Physical Exam  BP (!) 91/55   Pulse 84   Temp 98.6 F (37 C) (Oral)   Resp 18   Ht 5' 3.5" (1.613 m)   Wt 106.1 kg   SpO2 98%   BMI 40.80 kg/m   Physical Exam Vitals and nursing note reviewed.  Constitutional:      Appearance: She is not ill-appearing.  HENT:     Head: Normocephalic and atraumatic.  Eyes:     Conjunctiva/sclera: Conjunctivae normal.  Cardiovascular:     Rate and Rhythm: Normal rate and regular rhythm.  Pulmonary:     Effort: Pulmonary effort is normal.     Breath sounds: Normal breath sounds.  Skin:    General: Skin is warm and dry.     Coloration: Skin is not jaundiced.  Neurological:     Mental Status: She is alert.     ED Course/Procedures   Clinical Course as of 01/31/21 0813  Thu Jan 31, 2021  4259 Repeat hgb. Sent in for slow GI bleed and dizziness. If hgb stable and no longer tachycardia with orthostatics can go home. If symptomatic admit.  [MV]    Clinical Course User Index [MV] Eustaquio Maize, PA-C    Procedures  MDM  Repeat hgb 9.1.  Repeat orthostatics:  Orthostatic Lying  BP- Lying:105/58 Pulse- Lying:91 Orthostatic Sitting BP- Sitting:114/75 Pulse- Sitting:104 Orthostatic Standing at 0 minutes BP- Standing at 0 minutes:98/71 Pulse- Standing at 0 minutes:130 Orthostatic Standing at 3 minutes BP- Standing at 3 minutes:112/79 Pulse- Standing at 3 minutes:149  I do feel pt requires admission at  this time. She mentions she was scheduled for an iron transfusion this AM for the first time. Will relay to hospitalist team as she may benefit from one while in the hospital.   Discussed case with Dr. Sloan Leiter who agrees to accept patient for admission.   This note was prepared using Dragon voice recognition software and may include unintentional dictation errors due to the inherent limitations of voice recognition software.   Eustaquio Maize, PA-C 01/31/21 5638    Daleen Bo, MD 01/31/21 [phone]

## 2021-01-31 NOTE — H&P (Signed)
History and Physical    Jean Pitts [id number] DOB: 1986/04/21 DOA: 01/30/2021  PCP: Pleas Koch, NP  Patient coming from: Home  I have personally briefly reviewed patient's old medical records available.   Chief Complaint: Dizziness and palpitations on walking  HPI: Jean Pitts is a 35 y.o. female with medical history significant of migraine headache, GERD, recently diagnosed anastomotic gastric ulcer presents to the emergency department with complaints of dizziness.  Patient does have history of gastric bypass in 2014.  Since about a month ago patient was having intermittent dizziness, drop in her hemoglobin, orthostatic tachycardia.  She has been to multiple doctors visit with this. 4/4, EGD with anastomotic ulcer, no active bleeding.  Colonoscopy with polyp no active bleeding.  Treated with PPI. 4/23, continue to have orthostatic tachycardia and dizziness, repeat EGD/enteroscopy 4/27 with persistent anastomotic ulcer but no active bleeding.  PPI changed to high-dose omeprazole and discharged home. Was feeling dizzy and had palpitations, went to see her primary care physician yesterday and was sent to ER for possible blood transfusion.  She only gets dizzy when standing up most of the time.  Occasionally she feels palpitations even on lying back. Denies any chest pain or shortness of breath.  Denies any abdominal pain.  Patient did have melanotic stool and epigastric pain 2 weeks ago when she had to have endoscopy, however currently denies those symptoms.  Appetite is normal.  Bowel habits are normal.  No abdominal pain.  ED Course: Significant orthostatic changes 155/92-91/58.  2 L of normal saline and still tachycardic with heart rate 130-136 and feeling palpitation on orthostatic changes so requested admission. No evidence of active GI bleeding. Hemoglobin 9.1 after hydration which is at about her baseline for last 1 month. Chest x-ray normal.  Due to significant  symptoms, ER physician requested monitoring in the hospital and recheck hemoglobin after IV fluid hydration.  Review of Systems: all systems are reviewed and pertinent positive as per HPI otherwise rest are negative.    Past Medical History:  Diagnosis Date  . Abnormal glucose tolerance test in pregnancy, antepartum 06/04/2012   Formatting of this note might be different from the original. Needs 3h GTT  . B12 deficiency   . Family history of adverse reaction to anesthesia    MATERNAL AUNT-PT UNAWARE OF WHAT HAPPENED WITH ANESTHESIA BUT STATES HER AUNT HAS HAD PROBLEMS IN PAST   . Fecal occult blood test positive   . Genital herpes   . GERD (gastroesophageal reflux disease)   . H/O cesarean section complicating pregnancy 8/34/1962  . Hair thinning 05/12/2018  . Headache    MIGRAINE  . Heartburn 10/26/2018  . Hyperlipidemia   . Hypertension   . Migraine   . Obesity   . PONV (postoperative nausea and vomiting)   . Syncope 04-2015  . Vitamin D deficiency   . Yeast vaginitis 02/23/2019   Candida albicans on culture    Past Surgical History:  Procedure Laterality Date  . APPENDECTOMY    . BUNIONECTOMY    . CESAREAN SECTION     X2  . COLONOSCOPY WITH PROPOFOL N/A 12/31/2020   Procedure: COLONOSCOPY WITH PROPOFOL. eleview and spot ink tattoo used.;  Surgeon: Virgel Manifold, MD;  Location: [facility];  Service: Endoscopy;  Laterality: N/A;  . DIAGNOSTIC LAPAROSCOPY    . ENDOMETRIAL BIOPSY    . ENTEROSCOPY N/A 01/23/2021   Procedure: ENTEROSCOPY;  Surgeon: Virgel Manifold, MD;  Location: [facility];  Service:  Endoscopy;  Laterality: N/A;  . ESOPHAGOGASTRODUODENOSCOPY (EGD) WITH PROPOFOL N/A 12/31/2020   Procedure: ESOPHAGOGASTRODUODENOSCOPY (EGD) WITH PROPOFOL;  Surgeon: Virgel Manifold, MD;  Location: [facility];  Service: Endoscopy;  Laterality: N/A;  . GASTRIC BYPASS    . LASIK    . MOUTH SURGERY     Tongue Cyst  . NOVASURE ABLATION N/A 06/29/2015    Procedure: DILATION AND CURETTAGE, HYSTEROSCOPY, NOVASURE ABLATION;  Surgeon: Malachy Mood, MD;  Location: [facility];  Service: Gynecology;  Laterality: N/A;  . TUBAL LIGATION    . WISDOM TOOTH EXTRACTION      Social history   reports that she has never smoked. She has never used smokeless tobacco. She reports current alcohol use. She reports that she does not use drugs.  Allergies  Allergen Reactions  . Codeine Other (See Comments)    Other reaction(s): Other (See Comments)  . Hydrocodone-Acetaminophen Nausea And Vomiting  . Vicodin [Hydrocodone-Acetaminophen] Nausea And Vomiting    Family History  Problem Relation Age of Onset  . Obesity Mother   . Hyperlipidemia Mother   . Obesity Sister   . Obesity Brother   . Obesity Sister   . Ovarian cancer Maternal Grandmother 26       Cervical/Sqammacell     Prior to Admission medications   Medication Sig Start Date End Date Taking? Authorizing Provider  ferrous sulfate 325 (65 FE) MG tablet Take 2 tablets by mouth in the morning and at bedtime.   Yes [provider]  fluconazole (DIFLUCAN) 150 MG tablet Take 1 tablet (150 mg total) by mouth once a week. As preventive 05/05/75  Yes Copland, Deirdre Evener, PA-C  omeprazole (PRILOSEC) 40 MG capsule Take 1 capsule (40 mg total) by mouth in the morning and at bedtime. 01/23/21 03/24/21 Yes Virgel Manifold, MD  propranolol ER (INDERAL LA) 80 MG 24 hr capsule TAKE 1 CAPSULE (80 MG TOTAL) BY MOUTH DAILY. FOR HEADACHE PREVENTION. 01/20/21  Yes Pleas Koch, NP  sucralfate (CARAFATE) 1 GM/10ML suspension TAKE 10 MLS (1 G TOTAL) BY MOUTH 4 (FOUR) TIMES DAILY. 01/30/21 03/01/21 Yes Virgel Manifold, MD  SUMAtriptan Succinate Refill 6 MG/0.5ML SOCT Inject 0.5 mLs into the skin daily as needed (migraines).   Yes [provider]  Vitamin D, Ergocalciferol, (DRISDOL) 1.25 MG (50000 UNIT) CAPS capsule Take 1 capsule by mouth once weekly for 12 weeks. Patient taking differently:  Take 50,000 Units by mouth every 7 (seven) days. Wednesday 01/18/21  Yes Pleas Koch, NP    Physical Exam: Vitals:   01/31/21 0815 01/31/21 0830 01/31/21 0900 01/31/21 0930  BP:  111/66 115/75 121/86  Pulse: 85 84 87 78  Resp: 20 11 (!) 21 17  Temp:      TempSrc:      SpO2: 98% 99% 98% 97%  Weight:      Height:        Constitutional: NAD, calm, comfortable Vitals:   01/31/21 0815 01/31/21 0830 01/31/21 0900 01/31/21 0930  BP:  111/66 115/75 121/86  Pulse: 85 84 87 78  Resp: 20 11 (!) 21 17  Temp:      TempSrc:      SpO2: 98% 99% 98% 97%  Weight:      Height:       Eyes: PERRL, lids and conjunctivae normal ENMT: Mucous membranes are moist. Posterior pharynx clear of any exudate or lesions.Normal dentition.  Neck: normal, supple, no masses, no thyromegaly Respiratory: clear to auscultation bilaterally, no wheezing,  no crackles. Normal respiratory effort. No accessory muscle use.  Cardiovascular: Regular rate and rhythm, tachycardia.  No murmurs / rubs / gallops. No extremity edema. 2+ pedal pulses. No carotid bruits.  Abdomen: no tenderness, no masses palpated. No hepatosplenomegaly. Bowel sounds positive.  Musculoskeletal: no clubbing / cyanosis. No joint deformity upper and lower extremities. Good ROM, no contractures. Normal muscle tone.  Skin: no rashes, lesions, ulcers. No induration Neurologic: CN 2-12 grossly intact. Sensation intact, DTR normal. Strength 5/5 in all 4.  Psychiatric: Normal judgment and insight. Alert and oriented x 3. Normal mood.     Labs on Admission: I have personally reviewed following labs and imaging studies  CBC: Recent Labs  Lab 01/30/21 1944 01/31/21 0621  WBC 10.5 8.1  NEUTROABS 7.3  --   HGB 10.6* 9.1*  HCT 33.6* 29.2*  MCV 85.7 85.6  PLT 503* 025*   Basic Metabolic Panel: Recent Labs  Lab 01/30/21 1944  NA 137  K 3.6  CL 105  CO2 25  GLUCOSE 111*  BUN 7  CREATININE 0.72  CALCIUM 9.7   GFR: Estimated  Creatinine Clearance: 116.7 mL/min (by C-G formula based on SCr of 0.72 mg/dL). Liver Function Tests: Recent Labs  Lab 01/30/21 1944  AST 36  ALT 32  ALKPHOS 100  BILITOT 0.2*  PROT 8.3*  ALBUMIN 4.3   No results for input(s): LIPASE, AMYLASE in the last 168 hours. No results for input(s): AMMONIA in the last 168 hours. Coagulation Profile: No results for input(s): INR, PROTIME in the last 168 hours. Cardiac Enzymes: No results for input(s): CKTOTAL, CKMB, CKMBINDEX, TROPONINI in the last 168 hours. BNP (last 3 results) No results for input(s): PROBNP in the last 8760 hours. HbA1C: No results for input(s): HGBA1C in the last 72 hours. CBG: No results for input(s): GLUCAP in the last 168 hours. Lipid Profile: No results for input(s): CHOL, HDL, LDLCALC, TRIG, CHOLHDL, LDLDIRECT in the last 72 hours. Thyroid Function Tests: No results for input(s): TSH, T4TOTAL, FREET4, T3FREE, THYROIDAB in the last 72 hours. Anemia Panel: Recent Labs    01/31/21 0815  FERRITIN 5*  TIBC 468*  IRON 29   Urine analysis:    Component Value Date/Time   BILIRUBINUR neg 02/16/2019 1145   PROTEINUR Negative 02/16/2019 1145   NITRITE neg 02/16/2019 1145   LEUKOCYTESUR Small (1+) (A) 02/16/2019 1145    Radiological Exams on Admission: DG Chest Port 1 View  Result Date: 01/31/2021 CLINICAL DATA:  Palpitations EXAM: PORTABLE CHEST 1 VIEW COMPARISON:  None. FINDINGS: Normal heart size and mediastinal contours. No acute infiltrate or edema. No effusion or pneumothorax. No acute osseous findings. IMPRESSION: Negative chest. Electronically Signed   By: Monte Fantasia M.D.   On: 01/31/2021 06:21    EKG: Independently reviewed.  Sinus rhythm.  Heart rate 80.  No acute ST-T wave changes.  Assessment/Plan Principal Problem:   Orthostatic intolerance Active Problems:   Migraine without aura and without status migrainosus, not intractable   S/P bariatric surgery   Symptomatic anemia     1.   Orthostatic intolerance: Exact cause unknown.  Unlikely ongoing GI bleed.  However with significant change in orthostatic blood pressure and heart rate, will monitor in the hospital. Telemetry, no evidence of arrhythmia so far. Continue maintenance IV fluids, already received 2 L isotonic fluid. Resume home doses of propranolol that she uses for migraine. Continue to mobilize with precautions.  Check orthostatic every shift.  2.  Iron deficiency anemia, acute on chronic:  Hemoglobin was 8.6, 2 weeks ago, 9.1 here.  Clinically no evidence of GI bleeding. Will hydrate and recheck hemoglobin every 12 hours.  If any drop in hemoglobin, will need reevaluation by GI.  However patient recently had 2 upper GI endoscopies that showed nonbleeding anastomotic ulcer. Due for iron transfusion today, ordered IV iron 1 unit today. Currently no indication for blood transfusion.  Continue oral iron therapy. Patient had endometrial ablation, no evidence of menstrual blood loss.  3.  Migraine headache: Symptomatic treatment with sumatriptan and propranolol.  4.  Status post bariatric surgery: Patient has anastomotic ulcer.  Though it is not actively bleeding, if it does not heal, may need to go back to her surgery.  I discussed this with her.  She will touch base with the surgeon at [facility]. Patient was recently started on omeprazole 40 mg twice a day from Protonix 40 mg twice a day.  She had trouble tolerating omeprazole. Will change back to Protonix 40 mg twice a day, continue sucralfate.  DVT prophylaxis: SCDs Code Status: Full code Family Communication: Mother at the bedside Disposition Plan: Home.  Anticipate tomorrow morning. Consults called: None. Admission status: Observation.  Cardiac telemetry.   Barb Merino MD [facility] Pager [phone]

## 2021-02-01 DIAGNOSIS — I951 Orthostatic hypotension: Secondary | ICD-10-CM | POA: Diagnosis not present

## 2021-02-01 LAB — HEMOGLOBIN AND HEMATOCRIT, BLOOD
HCT: 28.2 % — ABNORMAL LOW (ref 36.0–46.0)
HCT: 28.9 % — ABNORMAL LOW (ref 36.0–46.0)
Hemoglobin: 8.8 g/dL — ABNORMAL LOW (ref 12.0–15.0)
Hemoglobin: 8.8 g/dL — ABNORMAL LOW (ref 12.0–15.0)

## 2021-02-01 MED ORDER — ACETAMINOPHEN 325 MG PO TABS
650.0000 mg | ORAL_TABLET | Freq: Four times a day (QID) | ORAL | Status: DC | PRN
  Administered 2021-02-01 – 2021-02-04 (×5): 650 mg via ORAL
  Filled 2021-02-01 (×5): qty 2

## 2021-02-01 NOTE — Progress Notes (Signed)
PROGRESS NOTE    JUDEE Pitts  QJJ:[id number] DOB: March 13, 1986 DOA: 01/30/2021 PCP: Pleas Koch, NP   Brief Narrative:  This 35 years old female with PMH significant for migraine headache, GERD, recently diagnosed anastomotic gastric ulcer presents in the ED with c/o:  dizziness.  Patient reports history of gastric bypass in 2014,  since about a month ago patient was having intermittent dizziness,  drop in her hemoglobin, orthostatic tachycardia,  she has been to multiple doctor visits.  She had EGD twice last month on 4/4 and 4/23 showed persistent anastomotic ulcer but no active bleeding. She was found to have significant orthostatic changes in the ED,  blood pressure 155/91> 91/58.  Patient was given 2 L of normal saline,  still remains tachycardic with heart rate 130-136 and reported palpitations on standing.  There is no evidence of active GI bleeding.  Patient is admitted for orthostatic hypotension.  Assessment & Plan:   Principal Problem:   Orthostatic intolerance Active Problems:   Migraine without aura and without status migrainosus, not intractable   S/P bariatric surgery   Symptomatic anemia   Orthostatic intolerance:  Exact cause unknown.  Unlikely ongoing GI bleed.   However with significant change in orthostatic blood pressure and heart rate, will monitor in the hospital. Telemetry does not show evidence of arrhythmia so far. Continue maintenance IV fluids. Resume home doses of propranolol that she uses for migraine. Continue to mobilize with precautions.  Check orthostatic every shift.  Iron deficiency anemia, acute on chronic: Hemoglobin was 8.6 about 2 weeks ago, 9.1 here.  Clinically no evidence of GI bleeding. Continue IV hydration and recheck H&H every 12 hours.. If any drop in hemoglobin, will need reevaluation by GI.   However patient recently had 2 upper GI endoscopies that showed nonbleeding anastomotic ulcer. Patient has received 1 dose of iron  infusion yesterday. Currently no indication for blood transfusion.  Continue oral iron therapy. Patient had endometrial ablation, no evidence of menstrual blood loss.  Migraine headache:  Continue symptomatic treatment with sumatriptan and propranolol.  Status post bariatric surgery:  Patient has anastomotic ulcer.  Though it is not actively bleeding, if it does not heal, may need to go back to her surgery.    She will touch base with the surgeon at [facility]. Patient was recently started on omeprazole 40 mg twice a day from Protonix 40 mg twice a day.  She had trouble tolerating omeprazole. Will change back to Protonix 40 mg twice a day, continue sucralfate.   DVT prophylaxis: SCDs Code Status: Full code. Family Communication: No family at bedside. Disposition Plan:  Status is: Observation  The patient remains OBS appropriate and will d/c before 2 midnights.  Dispo: The patient is from: Home              Anticipated d/c is to: Home              Patient currently is not medically stable to d/c.   Difficult to place patient No   Consultants:    None  Procedures: None Antimicrobials:   Anti-infectives (From admission, onward)   None      Subjective: Patient was seen and examined at bedside.  She reports feeling better denies any pain,  states she is feeling much improved after getting iron infusion yesterday.  She denies any obvious bleeding.  Objective: Vitals:   01/31/21 2207 02/01/21 0546 02/01/21 0913 02/01/21 1331  BP: (!) 118/103 (!) 98/57 112/72 (!) 93/54  Pulse: 87 70 69 70  Resp:  17  16  Temp:  98.1 F (36.7 C) 98.1 F (36.7 C) 98.3 F (36.8 C)  TempSrc:  Oral Oral   SpO2: 98% 95% 98% 98%  Weight:      Height:        Intake/Output Summary (Last 24 hours) at 02/01/2021 1536 Last data filed at 02/01/2021 0900 Gross per 24 hour  Intake 1674.01 ml  Output --  Net 1674.01 ml   Filed Weights   01/30/21 1917  Weight: 106.1 kg    Examination:  General  exam: Appears calm and comfortable, not in any acute distress Respiratory system: Clear to auscultation. Respiratory effort normal. Cardiovascular system: S1 & S2 heard, RRR. No JVD, murmurs, rubs, gallops or clicks. No pedal edema. Gastrointestinal system: Abdomen is nondistended, soft and nontender. No organomegaly or masses felt. Normal bowel sounds heard. Central nervous system: Alert and oriented. No focal neurological deficits. Extremities: Symmetric 5 x 5 power.  No edema, no cyanosis, no clubbing. Skin: No rashes, lesions or ulcers Psychiatry: Judgement and insight appear normal. Mood & affect appropriate.     Data Reviewed: I have personally reviewed following labs and imaging studies  CBC: Recent Labs  Lab 01/30/21 1944 01/31/21 0621 01/31/21 1702 02/01/21 0420  WBC 10.5 8.1  --   --   NEUTROABS 7.3  --   --   --   HGB 10.6* 9.1* 9.3* 8.8*  HCT 33.6* 29.2* 30.0* 28.2*  MCV 85.7 85.6  --   --   PLT 503* 430*  --   --    Basic Metabolic Panel: Recent Labs  Lab 01/30/21 1944  NA 137  K 3.6  CL 105  CO2 25  GLUCOSE 111*  BUN 7  CREATININE 0.72  CALCIUM 9.7   GFR: Estimated Creatinine Clearance: 116.7 mL/min (by C-G formula based on SCr of 0.72 mg/dL). Liver Function Tests: Recent Labs  Lab 01/30/21 1944  AST 36  ALT 32  ALKPHOS 100  BILITOT 0.2*  PROT 8.3*  ALBUMIN 4.3   No results for input(s): LIPASE, AMYLASE in the last 168 hours. No results for input(s): AMMONIA in the last 168 hours. Coagulation Profile: No results for input(s): INR, PROTIME in the last 168 hours. Cardiac Enzymes: No results for input(s): CKTOTAL, CKMB, CKMBINDEX, TROPONINI in the last 168 hours. BNP (last 3 results) No results for input(s): PROBNP in the last 8760 hours. HbA1C: No results for input(s): HGBA1C in the last 72 hours. CBG: No results for input(s): GLUCAP in the last 168 hours. Lipid Profile: No results for input(s): CHOL, HDL, LDLCALC, TRIG, CHOLHDL, LDLDIRECT  in the last 72 hours. Thyroid Function Tests: No results for input(s): TSH, T4TOTAL, FREET4, T3FREE, THYROIDAB in the last 72 hours. Anemia Panel: Recent Labs    01/31/21 0815  FERRITIN 5*  TIBC 468*  IRON 29   Sepsis Labs: No results for input(s): PROCALCITON, LATICACIDVEN in the last 168 hours.  Recent Results (from the past 240 hour(s))  SARS CORONAVIRUS 2 (TAT 6-24 HRS) Nasopharyngeal Nasopharyngeal Swab     Status: None   Collection Time: 01/31/21  8:16 AM   Specimen: Nasopharyngeal Swab  Result Value Ref Range Status   SARS Coronavirus 2 NEGATIVE NEGATIVE Final    Comment: (NOTE) SARS-CoV-2 target nucleic acids are NOT DETECTED.  The SARS-CoV-2 RNA is generally detectable in upper and lower respiratory specimens during the acute phase of infection. Negative results do not preclude SARS-CoV-2 infection, do not  rule out co-infections with other pathogens, and should not be used as the sole basis for treatment or other patient management decisions. Negative results must be combined with clinical observations, patient history, and epidemiological information. The expected result is Negative.  Fact Sheet for Patients: SugarRoll.be  Fact Sheet for Healthcare Providers: https://www.woods-mathews.com/  This test is not yet approved or cleared by the [place] FDA and  has been authorized for detection and/or diagnosis of SARS-CoV-2 by FDA under an Emergency Use Authorization (EUA). This EUA will remain  in effect (meaning this test can be used) for the duration of the COVID-19 declaration under Se ction 564(b)(1) of the Act, 21 U.S.C. section 360bbb-3(b)(1), unless the authorization is terminated or revoked sooner.  Performed at [facility], [facility] [address] [place], [place] [address]     Radiology Studies: DG Chest Port 1 View  Result Date: 01/31/2021 CLINICAL DATA:  Palpitations EXAM: PORTABLE CHEST 1 VIEW  COMPARISON:  None. FINDINGS: Normal heart size and mediastinal contours. No acute infiltrate or edema. No effusion or pneumothorax. No acute osseous findings. IMPRESSION: Negative chest. Electronically Signed   By: Monte Fantasia M.D.   On: 01/31/2021 06:21   Scheduled Meds: . ferrous sulfate  650 mg Oral BID WC  . propranolol ER  80 mg Oral Daily  . sucralfate  1 g Oral TID AC & HS   Continuous Infusions: . sodium chloride 100 mL/hr at 02/01/21 0547     LOS: 0 days    Time spent: 35 mins    Deckard Stuber, MD [facility]   If 7PM-7AM, please contact night-coverage

## 2021-02-01 NOTE — Plan of Care (Signed)

## 2021-02-02 DIAGNOSIS — D649 Anemia, unspecified: Secondary | ICD-10-CM | POA: Diagnosis present

## 2021-02-02 DIAGNOSIS — Z9884 Bariatric surgery status: Secondary | ICD-10-CM | POA: Diagnosis not present

## 2021-02-02 DIAGNOSIS — Z6841 Body Mass Index (BMI) 40.0 and over, adult: Secondary | ICD-10-CM | POA: Diagnosis not present

## 2021-02-02 DIAGNOSIS — Z79899 Other long term (current) drug therapy: Secondary | ICD-10-CM | POA: Diagnosis not present

## 2021-02-02 DIAGNOSIS — K219 Gastro-esophageal reflux disease without esophagitis: Secondary | ICD-10-CM | POA: Diagnosis present

## 2021-02-02 DIAGNOSIS — Z20822 Contact with and (suspected) exposure to covid-19: Secondary | ICD-10-CM | POA: Diagnosis present

## 2021-02-02 DIAGNOSIS — D509 Iron deficiency anemia, unspecified: Secondary | ICD-10-CM | POA: Diagnosis present

## 2021-02-02 DIAGNOSIS — Z8041 Family history of malignant neoplasm of ovary: Secondary | ICD-10-CM | POA: Diagnosis not present

## 2021-02-02 DIAGNOSIS — G43009 Migraine without aura, not intractable, without status migrainosus: Secondary | ICD-10-CM | POA: Diagnosis present

## 2021-02-02 DIAGNOSIS — Z980 Intestinal bypass and anastomosis status: Secondary | ICD-10-CM | POA: Diagnosis not present

## 2021-02-02 DIAGNOSIS — K289 Gastrojejunal ulcer, unspecified as acute or chronic, without hemorrhage or perforation: Secondary | ICD-10-CM | POA: Diagnosis not present

## 2021-02-02 DIAGNOSIS — Z885 Allergy status to narcotic agent status: Secondary | ICD-10-CM | POA: Diagnosis not present

## 2021-02-02 DIAGNOSIS — D500 Iron deficiency anemia secondary to blood loss (chronic): Secondary | ICD-10-CM

## 2021-02-02 DIAGNOSIS — I10 Essential (primary) hypertension: Secondary | ICD-10-CM | POA: Diagnosis present

## 2021-02-02 DIAGNOSIS — R000 Tachycardia, unspecified: Secondary | ICD-10-CM | POA: Diagnosis present

## 2021-02-02 DIAGNOSIS — I951 Orthostatic hypotension: Secondary | ICD-10-CM | POA: Diagnosis present

## 2021-02-02 DIAGNOSIS — K921 Melena: Secondary | ICD-10-CM | POA: Diagnosis present

## 2021-02-02 DIAGNOSIS — K259 Gastric ulcer, unspecified as acute or chronic, without hemorrhage or perforation: Secondary | ICD-10-CM | POA: Diagnosis not present

## 2021-02-02 DIAGNOSIS — Z83438 Family history of other disorder of lipoprotein metabolism and other lipidemia: Secondary | ICD-10-CM | POA: Diagnosis not present

## 2021-02-02 DIAGNOSIS — Z888 Allergy status to other drugs, medicaments and biological substances status: Secondary | ICD-10-CM | POA: Diagnosis not present

## 2021-02-02 LAB — BASIC METABOLIC PANEL
Anion gap: 7 (ref 5–15)
BUN: 8 mg/dL (ref 6–20)
CO2: 26 mmol/L (ref 22–32)
Calcium: 8.5 mg/dL — ABNORMAL LOW (ref 8.9–10.3)
Chloride: 109 mmol/L (ref 98–111)
Creatinine, Ser: 0.59 mg/dL (ref 0.44–1.00)
GFR, Estimated: >60 mL/min (ref >60)
Glucose, Bld: 81 mg/dL (ref 70–99)
Potassium: 4.3 mmol/L (ref 3.5–5.1)
Sodium: 142 mmol/L (ref 135–145)

## 2021-02-02 LAB — CBC
HCT: 27.2 % — ABNORMAL LOW (ref 36.0–46.0)
Hemoglobin: 8.1 g/dL — ABNORMAL LOW (ref 12.0–15.0)
MCH: 26.2 pg (ref 26.0–34.0)
MCHC: 29.8 g/dL — ABNORMAL LOW (ref 30.0–36.0)
MCV: 88 fL (ref 80.0–100.0)
Platelets: 341 10*3/uL (ref 150–400)
RBC: 3.09 MIL/uL — ABNORMAL LOW (ref 3.87–5.11)
RDW: 13.9 % (ref 11.5–15.5)
WBC: 6.8 10*3/uL (ref 4.0–10.5)
nRBC: 0 % (ref 0.0–0.2)

## 2021-02-02 LAB — PHOSPHORUS: Phosphorus: 3 mg/dL (ref 2.5–4.6)

## 2021-02-02 LAB — MAGNESIUM: Magnesium: 1.8 mg/dL (ref 1.7–2.4)

## 2021-02-02 LAB — OCCULT BLOOD X 1 CARD TO LAB, STOOL: Fecal Occult Bld: POSITIVE — AB

## 2021-02-02 LAB — HEMOGLOBIN AND HEMATOCRIT, BLOOD
HCT: 29 % — ABNORMAL LOW (ref 36.0–46.0)
Hemoglobin: 8.6 g/dL — ABNORMAL LOW (ref 12.0–15.0)

## 2021-02-02 MED ORDER — PANTOPRAZOLE SODIUM 40 MG IV SOLR
40.0000 mg | Freq: Two times a day (BID) | INTRAVENOUS | Status: DC
  Administered 2021-02-02 – 2021-02-04 (×5): 40 mg via INTRAVENOUS
  Filled 2021-02-02 (×5): qty 40

## 2021-02-02 NOTE — Consult Note (Addendum)
Attending physician's note   I have taken an interval history, reviewed the chart and examined the patient. I agree with the Advanced Practitioner's note, impression, and recommendations as outlined.   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  She continues to have intermittent episodes of melena and epigastric pain.  I reviewed her recent endoscopic images to include EGD on 12/31/2020 with single marginal ulcer and push enteroscopy on 01/23/2021 which demonstrates continued marginal ulcer, increased in size despite high-dose PPI.  I do see at least 2 surgical staples on the 4/4 EGD, 1 of which could be on the contralateral wall opposite the marginal ulcer.  I explained how these may serve as a nidus for continued inflammation/ulceration, and endoscopic removal could facilitate healing (but no guarantee).  Looking back through labs, has been dealing with iron deficiency since 05/2020 (ferritin 4.9), but Hgb 12.7 at that time (at baseline) and anemia not manifesting until 11/2020 (Hgb 9.5, ferritin 13.7, iron 30, sat 7.6%).   Had a long discussion with the patient today regarding treatment options to include 1) optimizing medical management, 2) Repeat endoscopy with surgical staple removal, 3) VCE to evaluate for distal pathology including the jejunojejunal anastomotic site, 4) referral to bariatric surgery for surgical revision.  After a long discussion with the patient, her mother present in room, and her husband on phone, she would like to try for upper endoscopy on this admission.  - Was given IV iron on arrival.  Plan for second dose in 2-3 weeks - Continue high-dose PPI.  Plan to transition to Nexium 40 mg bid with instructions to open the capsule and sprinkle in applesauce.  Alternatively, could treat with Prevacid SoluTabs - Plan for prolonged Carafate 1 g qid -Tentative plan for EGD/push enteroscopy  tomorrow for surgical staple removal and reattempt evaluation of the jejunojejunal anastomosis.  Endoscopy to be completed as space allows in the Endoscopy unit.  Ifavailable tomorrow, will resume p.o. and plan for 5/9 - N.p.o. at midnight - Continue serial H/H checks - Work-up of orthostatics per primary service  The indications, risks, and benefits of EGD/push enteroscopy were explained to the patient and her family members in detail. Risks include but are not limited to bleeding, perforation, adverse reaction to medications, and cardiopulmonary compromise. Sequelae include but are not limited to the possibility of surgery, hospitalization, and mortality. The patient verbalized understanding and wished to proceed.    Gerrit Heck, DO, [place] [phone] office            Referring Provider: Dr. Dwyane Dee, [facility] Primary Care Physician:  Pleas Koch, NP Primary Gastroenterologist:  Dr. Bonna Gains  Reason for Consultation:  Black stool, anemia  HPI: Jean Pitts is a 35 y.o. female PMH significant for migraine headache, GERD, previous bariatric surgery, recently diagnosed anastomotic gastric ulcer who presented to [facility] ED with dizziness.  She tells me that for the past month she has been having these episodes of dizziness upon standing, has been orthostatic upon evaluation.  Her hemoglobin has also been running low, the lowest for about 8 g.  Iron studies are also low.  Her hemoglobin is normal several months ago.  She has been on oral iron supplementation at home for the past month received an IV iron infusion last week.  She has had intermittent episodes of black stools.  All this prompted GI evaluation as follows:  Small bowel enteroscopy 01/23/2021:  - Normal esophagus. -  Roux-en-Y gastrojejunostomy with gastrojejunal anastomosis characterized by ulceration. - The examined portion of the jejunum was normal. - No specimens collected. - Given that the anastomotic has visibly  increased in size from last upper endoscopy, this is the likely source of patient's recurrent melena 3 days ago. It was clean based and did not need any therapeutic intervention today.  EGD 12/31/2020: Normal esophagus. - Roux-en-Y gastrojejunostomy with gastrojejunal anastomosis characterized by ulceration. - No specimens collected. - No evidence of active or recent bleeding seen throughout the exam.  Colonoscopy 12/31/2020: - One 15 mm polyp in the ascending colon, removed with mucosal resection. Resected and retrieved. Tattooed. Clip was placed. - The examination was otherwise normal. - The rectum, sigmoid colon, descending colon, transverse colon, ascending colon and cecum are normal. - The distal rectum and anal verge are normal on retroflexion view. - Mucosal resection was performed. Resection and retrieval were complete.  She had 1 episode of black stool yesterday morning, but none since.  She is currently not having any abdominal pain, but did have cramping at the time that the black stool occurred.  She is eating.  There has been talk about possibly needing revision surgery due to the persistent anastomotic ulcer that has been seen on her recent EGDs.  There has also been talk from her GI MD about potentially needing some other GI work-up such as VCE, etc, to be sure no bleeding issue in the small bowel.  She has been on pantoprazole 40 mg twice daily at home as well as Carafate.  PPI switched to omeprazole after the second EGD, but she says that it "tears her stomach up".  Past Medical History:  Diagnosis Date  . Abnormal glucose tolerance test in pregnancy, antepartum 06/04/2012   Formatting of this note might be different from the original. Needs 3h GTT  . B12 deficiency   . Family history of adverse reaction to anesthesia    MATERNAL AUNT-PT UNAWARE OF WHAT HAPPENED WITH ANESTHESIA BUT STATES HER AUNT HAS HAD PROBLEMS IN PAST   . Fecal occult blood test positive   . Genital herpes    . GERD (gastroesophageal reflux disease)   . H/O cesarean section complicating pregnancy 99991111  . Hair thinning 05/12/2018  . Headache    MIGRAINE  . Heartburn 10/26/2018  . Hyperlipidemia   . Hypertension   . Migraine   . Obesity   . PONV (postoperative nausea and vomiting)   . Syncope 04-2015  . Vitamin D deficiency   . Yeast vaginitis 02/23/2019   Candida albicans on culture    Past Surgical History:  Procedure Laterality Date  . APPENDECTOMY    . BUNIONECTOMY    . CESAREAN SECTION     X2  . COLONOSCOPY WITH PROPOFOL N/A 12/31/2020   Procedure: COLONOSCOPY WITH PROPOFOL. eleview and spot ink tattoo used.;  Surgeon: Virgel Manifold, MD;  Location: [place];  Service: Endoscopy;  Laterality: N/A;  . DIAGNOSTIC LAPAROSCOPY    . ENDOMETRIAL BIOPSY    . ENTEROSCOPY N/A 01/23/2021   Procedure: ENTEROSCOPY;  Surgeon: Virgel Manifold, MD;  Location[facility] ENDOSCOPY;  Service: Endoscopy;  Laterality: N/A;  . ESOPHAGOGASTRODUODENOSCOPY (EGD) WITH PROPOFOL N/A 12/31/2020   Procedure: ESOPHAGOGASTRODUODENOSCOPY (EGD) WITH PROPOFOL;  Surgeon: Virgel Manifold, MD;  Location: [place];  Service: Endoscopy;  Laterality: N/A;  . GASTRIC BYPASS    . LASIK    . MOUTH SURGERY     Tongue Cyst  . NOVASURE ABLATION N/A  06/29/2015   Procedure: DILATION AND CURETTAGE, HYSTEROSCOPY, NOVASURE ABLATION;  Surgeon: Malachy Mood, MD;  Location: [facility] ORS;  Service: Gynecology;  Laterality: N/A;  . TUBAL LIGATION    . WISDOM TOOTH EXTRACTION      Prior to Admission medications   Medication Sig Start Date End Date Taking? Authorizing Provider  ferrous sulfate 325 (65 FE) MG tablet Take 2 tablets by mouth in the morning and at bedtime.   Yes [provider]  fluconazole (DIFLUCAN) 150 MG tablet Take 1 tablet (150 mg total) by mouth once a week. As preventive 1/0/17  Yes Copland, Alicia B, PA-C  propranolol ER (INDERAL LA) 80 MG 24 hr capsule TAKE 1 CAPSULE  (80 MG TOTAL) BY MOUTH DAILY. FOR HEADACHE PREVENTION. 01/20/21  Yes Pleas Koch, NP  sucralfate (CARAFATE) 1 GM/10ML suspension TAKE 10 MLS (1 G TOTAL) BY MOUTH 4 (FOUR) TIMES DAILY. 01/30/21 03/01/21 Yes Virgel Manifold, MD  SUMAtriptan Succinate Refill 6 MG/0.5ML SOCT Inject 0.5 mLs into the skin daily as needed (migraines).   Yes [provider]  Vitamin D, Ergocalciferol, (DRISDOL) 1.25 MG (50000 UNIT) CAPS capsule Take 1 capsule by mouth once weekly for 12 weeks. Patient taking differently: Take 50,000 Units by mouth every 7 (seven) days. Wednesday 01/18/21  Yes Pleas Koch, NP  esomeprazole (NEXIUM) 40 MG capsule Take 1 capsule (40 mg total) by mouth 2 (two) times daily. 01/31/21 04/01/21  Virgel Manifold, MD    Current Facility-Administered Medications  Medication Dose Route Frequency Provider Last Rate Last Admin  . 0.9 %  sodium chloride infusion   Intravenous Continuous Barb Merino, MD 100 mL/hr at 02/02/21 0709 New Bag at 02/02/21 0709  . acetaminophen (TYLENOL) tablet 650 mg  650 mg Oral Q6H PRN Shawna Clamp, MD   650 mg at 02/02/21 0928  . ferrous sulfate tablet 650 mg  650 mg Oral BID WC Barb Merino, MD   650 mg at 02/02/21 0915  . propranolol ER (INDERAL LA) 24 hr capsule 80 mg  80 mg Oral Daily Barb Merino, MD   80 mg at 02/01/21 1026  . sucralfate (CARAFATE) 1 GM/10ML suspension 1 g  1 g Oral TID AC & HS Barb Merino, MD   1 g at 02/02/21 0913  . SUMAtriptan (IMITREX) injection 6 mg  6 mg Subcutaneous Daily PRN Barb Merino, MD        Allergies as of 01/30/2021 - Review Complete 01/30/2021  Allergen Reaction Noted  . Codeine Other (See Comments) 04/12/2014  . Hydrocodone-acetaminophen Nausea And Vomiting 06/26/2015  . Vicodin [hydrocodone-acetaminophen] Nausea And Vomiting 06/26/2015    Family History  Problem Relation Age of Onset  . Obesity Mother   . Hyperlipidemia Mother   . Obesity Sister   . Obesity Brother   . Obesity  Sister   . Ovarian cancer Maternal Grandmother 26       Cervical/Sqammacell    Social History   Socioeconomic History  . Marital status: Married    Spouse name: Not on file  . Number of children: 2  . Years of education: Not on file  . Highest education level: Some college, no degree  Occupational History  . Occupation: Multimedia programmer   Tobacco Use  . Smoking status: Never Smoker  . Smokeless tobacco: Never Used  Vaping Use  . Vaping Use: Never used  Substance and Sexual Activity  . Alcohol use: Yes    Comment: RARE  . Drug use: No  . Sexual  activity: Yes    Partners: Male    Birth control/protection: Surgical    Comment: Ablation  Other Topics Concern  . Not on file  Social History Narrative      She also goes to school full time at [employer] Strain: Not on file  Food Insecurity: Not on file  Transportation Needs: Not on file  Physical Activity: Not on file  Stress: Not on file  Social Connections: Not on file  Intimate Partner Violence: Not on file    Review of Systems: ROS is O/W negative except as mentioned in HPI.  Physical Exam: Vital signs in last 24 hours: Temp:  [98.1 F (36.7 C)-98.5 F (36.9 C)] 98.1 F (36.7 C) (05/07 0544) Pulse Rate:  [63-82] 63 (05/07 0544) Resp:  [16-20] 20 (05/07 0544) BP: (93-105)/(49-67) 101/49 (05/07 0544) SpO2:  [98 %-99 %] 98 % (05/07 0544) Last BM Date: 02/01/21 General:  Alert, Well-developed, well-nourished, pleasant and cooperative in NAD Head:  Normocephalic and atraumatic. Eyes:  Sclera clear, no icterus.  Conjunctiva pink. Ears:  Normal auditory acuity. Mouth:  No deformity or lesions.   Lungs:  Clear throughout to auscultation.  No wheezes, crackles, or rhonchi.  Heart:  Regular rate and rhythm; no murmurs, clicks, rubs, or gallops. Abdomen:  Soft, non-distended.  BS present.  Non-tender. Msk:  Symmetrical without gross deformities. Pulses:   Normal pulses noted. Extremities:  Without clubbing or edema. Neurologic:  Alert and oriented x 4;  grossly normal neurologically. Skin:  Intact without significant lesions or rashes. Psych:  Alert and cooperative. Normal mood and affect.  Intake/Output from previous day: 05/06 0701 - 05/07 0700 In: 580 [P.O.:480; I.V.:100] Out: -  Intake/Output this shift: Total I/O In: 1024.3 [I.V.:1024.3] Out: -   Lab Results: Recent Labs    01/30/21 1944 01/31/21 0621 01/31/21 1702 02/01/21 0420 02/01/21 1637 02/02/21 0538  WBC 10.5 8.1  --   --   --  6.8  HGB 10.6* 9.1*   < > 8.8* 8.8* 8.1*  HCT 33.6* 29.2*   < > 28.2* 28.9* 27.2*  PLT 503* 430*  --   --   --  341   < > = values in this interval not displayed.   BMET Recent Labs    01/30/21 1944 02/02/21 0538  NA 137 142  K 3.6 4.3  CL 105 109  CO2 25 26  GLUCOSE 111* 81  BUN 7 8  CREATININE 0.72 0.59  CALCIUM 9.7 8.5*   LFT Recent Labs    01/30/21 1944  PROT 8.3*  ALBUMIN 4.3  AST 36  ALT 32  ALKPHOS 100  BILITOT 0.2*   IMPRESSION:  *35 year old female admitted with ongoing complaints of dizziness.  She is status post bariatric surgery and had an clean-based anastomotic ulcer on EGD x2 in the month of April.  Is on twice daily PPI at home as well as carafate.  Hemoglobin overall is fairly stable, but reports an episode of black stool yesterday morning, none since.  From her chart it looks like the black stools have been intermittent.  BUN is normal.  She has on outpatient iron supplementation and received a dose of IV iron recently.  No sure whether or not the dizziness/orthostasis is related to the anemia or if there is something further that needs to be investigated. *Iron deficiency anemia: Hemoglobin was 8.6 g 11 days ago, up to 10.6 three days ago then to 9.1>9.3>8.8>8.1  grams this morning.   PLAN: -Continue Carafate 4 times daily. -I am going to resume twice daily IV PPI therapy. -Trend hemoglobin and  transfuse if needed. -Likely needs revision surgery due to ongoing/enlarging anastomotic ulcer.  No plans for repeat endoscopic evaluation here. -Could consider trying prevacid solutab as an outpatient as they may help more with ulcer healing.   Laban Emperor. Zehr  02/02/2021, 10:37 AM

## 2021-02-02 NOTE — Progress Notes (Signed)
PROGRESS NOTE    Jean Pitts  QPY:[id number] DOB: [birth year] DOA: 01/30/2021 PCP: Pleas Koch, NP   Brief Narrative:  This 35 years old female with PMH significant for migraine headache, GERD, recently diagnosed anastomotic gastric ulcer presents in the ED with c/o:  dizziness.  Patient reports history of gastric bypass in 2014,  since about a month ago patient was having intermittent dizziness,  drop in her hemoglobin, orthostatic tachycardia,  she has been to multiple doctor visits.  She had EGD twice last month on 4/4 and 4/23 showed persistent anastomotic ulcer but no active bleeding. She was found to have significant orthostatic changes in the ED,  blood pressure 155/91> 91/58.  Patient was given 2 L of normal saline,  still remains tachycardic with heart rate 130-136 and reported palpitations on standing.  There is no evidence of active GI bleeding.  Patient is admitted for orthostatic hypotension. Patient reports to have dark colored stools  Assessment & Plan:   Principal Problem:   Orthostatic intolerance Active Problems:   Migraine without aura and without status migrainosus, not intractable   S/P bariatric surgery   Symptomatic anemia   Orthostatic intolerance:  Exact cause unknown.  Unlikely ongoing GI bleed.   However with significant change in orthostatic blood pressure and heart rate, will monitor in the hospital. Telemetry does not show any evidence of arrhythmia so far. Continue maintenance IV fluids. Resume home doses of propranolol that she uses for migraine. Continue to mobilize with precautions.  Check orthostatic every shift.  Iron deficiency anemia, acute on chronic: Hemoglobin was 8.6 about 2 weeks ago,  Hb 9.1 in ED.  Clinically no evidence of GI bleeding. Continue IV hydration and recheck H&H every 12 hours.. However patient recently had 2 upper GI endoscopies that showed nonbleeding anastomotic ulcer. Patient has received one dose of iron infusion  5/5.Marland Kitchen Currently no indication for blood transfusion.  Continue oral iron therapy. Patient had endometrial ablation, no evidence of menstrual blood loss. Hemoglobin has dropped from 8.8> 8.1.  GI was consulted. GI recommended Carafate QID, IV PPI therapy, no plans for repeat EGD.  Migraine headache:  Continue symptomatic treatment with sumatriptan and propranolol.  Status post bariatric surgery:  Patient has anastomotic ulcer.  Though it is not actively bleeding, if it does not heal, may need to go back to her surgery.    She will touch base with the surgeon at [facility]. Patient was recently started on omeprazole 40 mg twice a day from Protonix 40 mg twice a day.  She had trouble tolerating omeprazole. Will change back to Protonix 40 mg twice a day, continue sucralfate.   DVT prophylaxis: SCDs Code Status: Full code. Family Communication: No family at bedside. Disposition Plan:  Status is: Observation  The patient remains OBS appropriate and will d/c before 2 midnights.  Dispo: The patient is from: Home              Anticipated d/c is to: Home              Patient currently is not medically stable to d/c.   Difficult to place patient No   Consultants:   Gastroenterology  Procedures: None Antimicrobials:   Anti-infectives (From admission, onward)   None      Subjective: Patient was seen and examined at bedside. No overnight events.  She reports feeling better,  denies any pain,  She reports having dark-colored stool yesterday, none afterwards.  Objective: Vitals:   02/01/21 0913 02/01/21  1331 02/01/21 2116 02/02/21 0544  BP: 112/72 (!) 93/54 105/67 (!) 101/49  Pulse: 69 70 82 63  Resp:  16 19 20   Temp: 98.1 F (36.7 C) 98.3 F (36.8 C) 98.5 F (36.9 C) 98.1 F (36.7 C)  TempSrc: Oral  Oral Oral  SpO2: 98% 98% 99% 98%  Weight:      Height:        Intake/Output Summary (Last 24 hours) at 02/02/2021 1425 Last data filed at 02/02/2021 1100 Gross per 24 hour   Intake 1604.31 ml  Output --  Net 1604.31 ml   Filed Weights   01/30/21 1917  Weight: 106.1 kg    Examination:  General exam: Appears calm and comfortable, not in any acute distress. Respiratory system: Clear to auscultation. Respiratory effort normal. Cardiovascular system: S1 & S2 heard, RRR. No JVD, murmurs, rubs, gallops or clicks. No pedal edema. Gastrointestinal system: Abdomen is nondistended, soft and nontender. No organomegaly or masses felt. Normal bowel sounds heard. Central nervous system: Alert and oriented. No focal neurological deficits. Extremities: Symmetric 5 x 5 power.  No edema, no cyanosis, no clubbing. Skin: No rashes, lesions or ulcers Psychiatry: Judgement and insight appear normal. Mood & affect appropriate.     Data Reviewed: I have personally reviewed following labs and imaging studies  CBC: Recent Labs  Lab 01/30/21 1944 01/31/21 0621 01/31/21 1702 02/01/21 0420 02/01/21 1637 02/02/21 0538  WBC 10.5 8.1  --   --   --  6.8  NEUTROABS 7.3  --   --   --   --   --   HGB 10.6* 9.1* 9.3* 8.8* 8.8* 8.1*  HCT 33.6* 29.2* 30.0* 28.2* 28.9* 27.2*  MCV 85.7 85.6  --   --   --  88.0  PLT 503* 430*  --   --   --  638   Basic Metabolic Panel: Recent Labs  Lab 01/30/21 1944 02/02/21 0538  NA 137 142  K 3.6 4.3  CL 105 109  CO2 25 26  GLUCOSE 111* 81  BUN 7 8  CREATININE 0.72 0.59  CALCIUM 9.7 8.5*  MG  --  1.8  PHOS  --  3.0   GFR: Estimated Creatinine Clearance: 116.7 mL/min (by C-G formula based on SCr of 0.59 mg/dL). Liver Function Tests: Recent Labs  Lab 01/30/21 1944  AST 36  ALT 32  ALKPHOS 100  BILITOT 0.2*  PROT 8.3*  ALBUMIN 4.3   No results for input(s): LIPASE, AMYLASE in the last 168 hours. No results for input(s): AMMONIA in the last 168 hours. Coagulation Profile: No results for input(s): INR, PROTIME in the last 168 hours. Cardiac Enzymes: No results for input(s): CKTOTAL, CKMB, CKMBINDEX, TROPONINI in the last  168 hours. BNP (last 3 results) No results for input(s): PROBNP in the last 8760 hours. HbA1C: No results for input(s): HGBA1C in the last 72 hours. CBG: No results for input(s): GLUCAP in the last 168 hours. Lipid Profile: No results for input(s): CHOL, HDL, LDLCALC, TRIG, CHOLHDL, LDLDIRECT in the last 72 hours. Thyroid Function Tests: No results for input(s): TSH, T4TOTAL, FREET4, T3FREE, THYROIDAB in the last 72 hours. Anemia Panel: Recent Labs    01/31/21 0815  FERRITIN 5*  TIBC 468*  IRON 29   Sepsis Labs: No results for input(s): PROCALCITON, LATICACIDVEN in the last 168 hours.  Recent Results (from the past 240 hour(s))  SARS CORONAVIRUS 2 (TAT 6-24 HRS) Nasopharyngeal Nasopharyngeal Swab     Status: None  Collection Time: 01/31/21  8:16 AM   Specimen: Nasopharyngeal Swab  Result Value Ref Range Status   SARS Coronavirus 2 NEGATIVE NEGATIVE Final    Comment: (NOTE) SARS-CoV-2 target nucleic acids are NOT DETECTED.  The SARS-CoV-2 RNA is generally detectable in upper and lower respiratory specimens during the acute phase of infection. Negative results do not preclude SARS-CoV-2 infection, do not rule out co-infections with other pathogens, and should not be used as the sole basis for treatment or other patient management decisions. Negative results must be combined with clinical observations, patient history, and epidemiological information. The expected result is Negative.  Fact Sheet for Patients: SugarRoll.be  Fact Sheet for Healthcare Providers: https://www.woods-mathews.com/  This test is not yet approved or cleared by the [place] FDA and  has been authorized for detection and/or diagnosis of SARS-CoV-2 by FDA under an Emergency Use Authorization (EUA). This EUA will remain  in effect (meaning this test can be used) for the duration of the COVID-19 declaration under Se ction 564(b)(1) of the Act, 21  U.S.C. section 360bbb-3(b)(1), unless the authorization is terminated or revoked sooner.  Performed at [facility], [place] [address], [place], [place] [address]     Radiology Studies: No results found. Scheduled Meds: . ferrous sulfate  650 mg Oral BID WC  . pantoprazole (PROTONIX) IV  40 mg Intravenous Q12H  . propranolol ER  80 mg Oral Daily  . sucralfate  1 g Oral TID AC & HS   Continuous Infusions: . sodium chloride 100 mL/hr at 02/02/21 0709     LOS: 0 days    Time spent: 25 mins    Lezette Kitts, MD [facility]   If 7PM-7AM, please contact night-coverage

## 2021-02-03 ENCOUNTER — Encounter (HOSPITAL_COMMUNITY): Payer: Self-pay | Admitting: Internal Medicine

## 2021-02-03 DIAGNOSIS — D509 Iron deficiency anemia, unspecified: Principal | ICD-10-CM

## 2021-02-03 DIAGNOSIS — K289 Gastrojejunal ulcer, unspecified as acute or chronic, without hemorrhage or perforation: Secondary | ICD-10-CM

## 2021-02-03 DIAGNOSIS — Z9884 Bariatric surgery status: Secondary | ICD-10-CM

## 2021-02-03 LAB — HEMOGLOBIN AND HEMATOCRIT, BLOOD
HCT: 30.7 % — ABNORMAL LOW (ref 36.0–46.0)
Hemoglobin: 9.3 g/dL — ABNORMAL LOW (ref 12.0–15.0)

## 2021-02-03 LAB — CBC
HCT: 26.8 % — ABNORMAL LOW (ref 36.0–46.0)
Hemoglobin: 8.2 g/dL — ABNORMAL LOW (ref 12.0–15.0)
MCH: 27.2 pg (ref 26.0–34.0)
MCHC: 30.6 g/dL (ref 30.0–36.0)
MCV: 88.7 fL (ref 80.0–100.0)
Platelets: 319 10*3/uL (ref 150–400)
RBC: 3.02 MIL/uL — ABNORMAL LOW (ref 3.87–5.11)
RDW: 14.1 % (ref 11.5–15.5)
WBC: 6.1 10*3/uL (ref 4.0–10.5)
nRBC: 0 % (ref 0.0–0.2)

## 2021-02-03 MED ORDER — SUMATRIPTAN SUCCINATE 6 MG/0.5ML ~~LOC~~ SOLN
6.0000 mg | SUBCUTANEOUS | Status: DC | PRN
  Administered 2021-02-03: 6 mg via SUBCUTANEOUS
  Filled 2021-02-03 (×2): qty 0.5

## 2021-02-03 MED ORDER — MORPHINE SULFATE (PF) 2 MG/ML IV SOLN
2.0000 mg | INTRAVENOUS | Status: DC | PRN
Start: 2021-02-03 — End: 2021-02-04
  Administered 2021-02-03 (×3): 2 mg via INTRAVENOUS
  Filled 2021-02-03 (×4): qty 1

## 2021-02-03 MED ORDER — SODIUM CHLORIDE 0.9 % IV SOLN: INTRAVENOUS | Status: DC

## 2021-02-03 NOTE — Progress Notes (Addendum)
Attending physician's note   I have taken an interval history, reviewed the chart and examined the patient. I agree with the Advanced Practitioner's note, impression, and recommendations as outlined.   -Plan for EGD tomorrow with Dr. Fuller Plan at 8 AM.  Will reevaluate previously noted marginal ulcer, along with removal of surgical staples which may serve as continued nidus for poor ulcer healing. - Continue high-dose PPI with Nexium 40 mg bid.  To break open capsules and sprinkle in applesauce or similar soft food - Continue Carafate qid for the time being - N.p.o. at midnight - Plan for second dose of IV iron as outpatient in 2-3 weeks  [place], DO, FACG [phone] office              [facility] Progress Note  CC:  Black, heme positive stool and anemia  Subjective:  Says that she had another black stool after Dr. Bryan Lemma left yesterday but none since.  Was hemoccult positive.  No abdominal pain currently.  Objective:  Vital signs in last 24 hours: Temp:  [98.3 F (36.8 C)-98.7 F (37.1 C)] 98.6 F (37 C) (05/08 0535) Pulse Rate:  [71-88] 82 (05/08 0543) Resp:  [16-20] 20 (05/08 0537) BP: (95-120)/(51-66) 120/66 (05/08 0543) SpO2:  [97 %-100 %] 99 % (05/08 0543) Last BM Date: 02/01/21 General:  Alert, Well-developed, in NAD Heart:  Regular rate and rhythm; no murmurs Pulm:  CTAB.  No W/R/R. Abdomen:  Soft, non-distended.  BS present.  Non-tender. Extremities:  Without edema. Neurologic:  Alert and  oriented x4;  grossly normal neurologically. Psych:  Alert and cooperative. Normal mood and affect.  Intake/Output from previous day: 05/07 0701 - 05/08 0700 In: 2279.9 [P.O.:480; I.V.:1799.9] Out: -   Lab Results: Recent Labs    02/02/21 0538 02/02/21 1650 02/03/21 0638  WBC 6.8  --  6.1  HGB 8.1* 8.6* 8.2*  HCT 27.2* 29.0* 26.8*  PLT 341  --  319   BMET Recent Labs    02/02/21 0538  NA 142  K 4.3  CL 109  CO2 26  GLUCOSE 81   BUN 8  CREATININE 0.59  CALCIUM 8.5*   Assessment / Plan: *35 year old female admitted with ongoing complaints of dizziness.  She is status post bariatric surgery and had an clean-based anastomotic ulcer on EGD x2 in the month of April.  Is on twice daily PPI at home as well as carafate.  Hemoglobin overall is fairly stable, but reports an episode of black stool yesterday morning, none since.  From her chart it looks like the black stools have been intermittent.  BUN is normal.  She has on outpatient iron supplementation and received a dose of IV iron recently.  No sure whether or not the dizziness/orthostasis is related to the anemia or if there is something further that needs to be investigated. *Iron deficiency anemia: Hemoglobin was 8.6 g 11 days ago, up to 10.6 three days ago then to 9.1>9.3>8.8>8.1>8.2  grams this morning.  -Continue Carafate 4 times daily. -Continue Pantoprazole 40 mg IV BID. -Trend hemoglobin and transfuse if needed. -Plan is for EGD tomorrow, 5/9, to try to remove surgical staples, which could serve as a nidus for continued inflammation/ulcerationg.  [place] for full liquids today then NPO after midnight.    LOS: 1 day   Laban Emperor. Zehr  02/03/2021, 9:37 AM

## 2021-02-03 NOTE — Progress Notes (Signed)
Progress Note    Jean Pitts  EVO:[id number] DOB: 08/31/1986  DOA: 01/30/2021 PCP: Pleas Koch, NP      Brief Narrative:    Medical records reviewed and are as summarized below:  Jean Pitts is a 35 y.o. female       Assessment/Plan:   Principal Problem:   Orthostatic intolerance Active Problems:   Migraine without aura and without status migrainosus, not intractable   S/P bariatric surgery   Marginal ulcer   Symptomatic anemia   History of Roux-en-Y gastric bypass    Body mass index is 40.8 kg/m.  (Morbid obesity)   Melena, history of Roux-en-Y bypass surgery in 2014, recent persistent marginal ulcer (noted on EGD on 12/31/2020 on push enteroscopy on 01/23/2021)): Plan for EGD tomorrow.  Full liquid diet for now but n.p.o. after midnight.  Continue IV Protonix and oral sucralfate.  Discontinue IV fluids.  Orthostatic hypotension and tachycardia: Improved.  Patient said this normally improves after IV iron infusion.  Continue to monitor.  Acute on chronic anemia, iron deficiency anemia: H&H is stable.  No indication for blood transfusion.  S/p IV iron infusion on 01/31/2021   Diet Order            Diet NPO time specified  Diet effective midnight           Diet full liquid Room service appropriate? Yes; Fluid consistency: Thin  Diet effective now                    Consultants:  Gastroenterologist  Procedures:  None    Medications:   . ferrous sulfate  650 mg Oral BID WC  . pantoprazole (PROTONIX) IV  40 mg Intravenous Q12H  . propranolol ER  80 mg Oral Daily  . sucralfate  1 g Oral TID AC & HS   Continuous Infusions: . sodium chloride       Anti-infectives (From admission, onward)   None             Family Communication/Anticipated D/C date and plan/Code Status   DVT prophylaxis: SCDs Start: 01/31/21 1428     Code Status: Full Code  Family Communication: Husband at the bedside Disposition Plan:     Status is: Inpatient  Remains inpatient appropriate because:Inpatient level of care appropriate due to severity of illness   Dispo: The patient is from: Home              Anticipated d/c is to: Home              Patient currently is not medically stable to d/c.   Difficult to place patient No           Subjective:   Interval events noted.  She had black stools last night.  No hematemesis or abdominal pain.  Objective:    Vitals:   02/03/21 0537 02/03/21 0539 02/03/21 0543 02/03/21 1355  BP: 115/63 111/61 120/66 119/71  Pulse: 88 80 82 76  Resp: 20   17  Temp:    98.6 F (37 C)  TempSrc:      SpO2: 98% 98% 99% 98%  Weight:      Height:       No data found.   Intake/Output Summary (Last 24 hours) at 02/03/2021 1437 Last data filed at 02/03/2021 0900 Gross per 24 hour  Intake 1015.61 ml  Output --  Net 1015.61 ml   Filed Weights   01/30/21 1917  Weight: 106.1 kg    Exam:  GEN: NAD SKIN: Warm and dry EYES: EOMI ENT: MMM CV: RRR PULM: CTA B ABD: soft, obese, mild epigastric tenderness, +BS CNS: AAO x 3, non focal EXT: No edema or tenderness        Data Reviewed:   I have personally reviewed following labs and imaging studies:  Labs: Labs show the following:   Basic Metabolic Panel: Recent Labs  Lab 01/30/21 1944 02/02/21 0538  NA 137 142  K 3.6 4.3  CL 105 109  CO2 25 26  GLUCOSE 111* 81  BUN 7 8  CREATININE 0.72 0.59  CALCIUM 9.7 8.5*  MG  --  1.8  PHOS  --  3.0   GFR Estimated Creatinine Clearance: 116.7 mL/min (by C-G formula based on SCr of 0.59 mg/dL). Liver Function Tests: Recent Labs  Lab 01/30/21 1944  AST 36  ALT 32  ALKPHOS 100  BILITOT 0.2*  PROT 8.3*  ALBUMIN 4.3   No results for input(s): LIPASE, AMYLASE in the last 168 hours. No results for input(s): AMMONIA in the last 168 hours. Coagulation profile No results for input(s): INR, PROTIME in the last 168 hours.  CBC: Recent Labs  Lab  01/30/21 1944 01/31/21 0621 01/31/21 1702 02/01/21 0420 02/01/21 1637 02/02/21 0538 02/02/21 1650 02/03/21 0638  WBC 10.5 8.1  --   --   --  6.8  --  6.1  NEUTROABS 7.3  --   --   --   --   --   --   --   HGB 10.6* 9.1*   < > 8.8* 8.8* 8.1* 8.6* 8.2*  HCT 33.6* 29.2*   < > 28.2* 28.9* 27.2* 29.0* 26.8*  MCV 85.7 85.6  --   --   --  88.0  --  88.7  PLT 503* 430*  --   --   --  341  --  319   < > = values in this interval not displayed.   Cardiac Enzymes: No results for input(s): CKTOTAL, CKMB, CKMBINDEX, TROPONINI in the last 168 hours. BNP (last 3 results) No results for input(s): PROBNP in the last 8760 hours. CBG: No results for input(s): GLUCAP in the last 168 hours. D-Dimer: No results for input(s): DDIMER in the last 72 hours. Hgb A1c: No results for input(s): HGBA1C in the last 72 hours. Lipid Profile: No results for input(s): CHOL, HDL, LDLCALC, TRIG, CHOLHDL, LDLDIRECT in the last 72 hours. Thyroid function studies: No results for input(s): TSH, T4TOTAL, T3FREE, THYROIDAB in the last 72 hours.  Invalid input(s): FREET3 Anemia work up: No results for input(s): VITAMINB12, FOLATE, FERRITIN, TIBC, IRON, RETICCTPCT in the last 72 hours. Sepsis Labs: Recent Labs  Lab 01/30/21 1944 01/31/21 0621 02/02/21 0538 02/03/21 0638  WBC 10.5 8.1 6.8 6.1    Microbiology Recent Results (from the past 240 hour(s))  SARS CORONAVIRUS 2 (TAT 6-24 HRS) Nasopharyngeal Nasopharyngeal Swab     Status: None   Collection Time: 01/31/21  8:16 AM   Specimen: Nasopharyngeal Swab  Result Value Ref Range Status   SARS Coronavirus 2 NEGATIVE NEGATIVE Final    Comment: (NOTE) SARS-CoV-2 target nucleic acids are NOT DETECTED.  The SARS-CoV-2 RNA is generally detectable in upper and lower respiratory specimens during the acute phase of infection. Negative results do not preclude SARS-CoV-2 infection, do not rule out co-infections with other pathogens, and should not be used as the sole  basis for treatment or other patient management decisions. Negative  results must be combined with clinical observations, patient history, and epidemiological information. The expected result is Negative.  Fact Sheet for Patients: SugarRoll.be  Fact Sheet for Healthcare Providers: https://www.woods-mathews.com/  This test is not yet approved or cleared by the [place] FDA and  has been authorized for detection and/or diagnosis of SARS-CoV-2 by FDA under an Emergency Use Authorization (EUA). This EUA will remain  in effect (meaning this test can be used) for the duration of the COVID-19 declaration under Se ction 564(b)(1) of the Act, 21 U.S.C. section 360bbb-3(b)(1), unless the authorization is terminated or revoked sooner.  Performed at [facility], [place] [address] [place], [place] [address]     Procedures and diagnostic studies:  No results found.             LOS: 1 day   Magali Bray  [facility]   Pager on [email or web address]. If 7PM-7AM, please contact night-coverage at [email or web address]     02/03/2021, 2:37 PM

## 2021-02-04 ENCOUNTER — Encounter (HOSPITAL_COMMUNITY): Payer: Self-pay | Admitting: Internal Medicine

## 2021-02-04 ENCOUNTER — Inpatient Hospital Stay (HOSPITAL_COMMUNITY): Payer: BLUE CROSS/BLUE SHIELD | Admitting: Anesthesiology

## 2021-02-04 ENCOUNTER — Encounter (HOSPITAL_COMMUNITY)
Admission: EM | Disposition: A | Discharge status: Home / Self Care | Payer: Self-pay | Source: Home / Self Care | Attending: Family Medicine

## 2021-02-04 DIAGNOSIS — Z980 Intestinal bypass and anastomosis status: Secondary | ICD-10-CM

## 2021-02-04 DIAGNOSIS — K259 Gastric ulcer, unspecified as acute or chronic, without hemorrhage or perforation: Secondary | ICD-10-CM

## 2021-02-04 DIAGNOSIS — T183XXA Foreign body in small intestine, initial encounter: Secondary | ICD-10-CM

## 2021-02-04 DIAGNOSIS — D649 Anemia, unspecified: Secondary | ICD-10-CM

## 2021-02-04 HISTORY — PX: ENTEROSCOPY: SHX5533

## 2021-02-04 HISTORY — PX: FOREIGN BODY REMOVAL: SHX962

## 2021-02-04 LAB — HEMOGLOBIN AND HEMATOCRIT, BLOOD
HCT: 28.6 % — ABNORMAL LOW (ref 36.0–46.0)
Hemoglobin: 8.8 g/dL — ABNORMAL LOW (ref 12.0–15.0)

## 2021-02-04 SURGERY — ENTEROSCOPY
Anesthesia: Monitor Anesthesia Care

## 2021-02-04 MED ORDER — SODIUM CHLORIDE 0.9 % IV SOLN
510.0000 mg | Freq: Once | INTRAVENOUS | Status: AC
  Administered 2021-02-04: 510 mg via INTRAVENOUS
  Filled 2021-02-04: qty 510

## 2021-02-04 MED ORDER — PROPOFOL 10 MG/ML IV BOLUS
INTRAVENOUS | Status: AC
  Filled 2021-02-04: qty 20

## 2021-02-04 MED ORDER — PROPOFOL 10 MG/ML IV BOLUS
INTRAVENOUS | Status: DC | PRN
  Administered 2021-02-04 (×3): 20 mg via INTRAVENOUS

## 2021-02-04 MED ORDER — LIDOCAINE 2% (20 MG/ML) 5 ML SYRINGE
INTRAMUSCULAR | Status: DC | PRN
  Administered 2021-02-04: 100 mg via INTRAVENOUS

## 2021-02-04 MED ORDER — PROPOFOL 500 MG/50ML IV EMUL
INTRAVENOUS | Status: AC
  Filled 2021-02-04: qty 50

## 2021-02-04 MED ORDER — GLUCAGON HCL RDNA (DIAGNOSTIC) 1 MG IJ SOLR
INTRAMUSCULAR | Status: DC | PRN
  Administered 2021-02-04 (×2): .25 mg via INTRAVENOUS

## 2021-02-04 MED ORDER — GLUCAGON HCL RDNA (DIAGNOSTIC) 1 MG IJ SOLR
INTRAMUSCULAR | Status: AC
  Filled 2021-02-04: qty 1

## 2021-02-04 MED ORDER — FERROUS SULFATE 325 (65 FE) MG PO TABS
325.0000 mg | ORAL_TABLET | ORAL | 3 refills | Status: DC
Start: 2021-02-04 — End: 2021-09-02

## 2021-02-04 MED ORDER — PROPOFOL 500 MG/50ML IV EMUL
INTRAVENOUS | Status: DC | PRN
  Administered 2021-02-04: 125 ug/kg/min via INTRAVENOUS

## 2021-02-04 MED ORDER — VITAMIN D (ERGOCALCIFEROL) 1.25 MG (50000 UNIT) PO CAPS: 50000.0000 [IU] | ORAL_CAPSULE | ORAL | Status: DC

## 2021-02-04 MED ORDER — LACTATED RINGERS IV SOLN: INTRAVENOUS | Status: DC

## 2021-02-04 NOTE — Transfer of Care (Signed)
Immediate Anesthesia Transfer of Care Note  Patient: Jean Pitts  Procedure(s) Performed: ENTEROSCOPY (N/A ) FOREIGN BODY REMOVAL  Patient Location: Endoscopy Unit  Anesthesia Type:MAC  Level of Consciousness: drowsy  Airway & Oxygen Therapy: Patient Spontanous Breathing and Patient connected to face mask oxygen  Post-op Assessment: Report given to RN and Post -op Vital signs reviewed and stable  Post vital signs: Reviewed and stable  Last Vitals:  Vitals Value Taken Time  BP    Temp    Pulse    Resp    SpO2      Last Pain:  Vitals:   02/04/21 0731  TempSrc:   PainSc: 0-No pain      Patients Stated Pain Goal: 1 (41/42/39 5320)  Complications: No complications documented.

## 2021-02-04 NOTE — Plan of Care (Signed)

## 2021-02-04 NOTE — Anesthesia Preprocedure Evaluation (Addendum)
Anesthesia Evaluation  Patient identified by MRN, date of birth, ID band Patient awake    Reviewed: Allergy & Precautions, NPO status , Patient's Chart, lab work & pertinent test results  History of Anesthesia Complications (+) PONV and history of anesthetic complications  Airway Mallampati: II  TM Distance: >3 FB Neck ROM: Full    Dental  (+) Teeth Intact, Dental Advisory Given   Pulmonary    breath sounds clear to auscultation       Cardiovascular hypertension,  Rhythm:Regular Rate:Normal     Neuro/Psych  Headaches, negative psych ROS   GI/Hepatic Neg liver ROS, GERD  Medicated,  Endo/Other  negative endocrine ROS  Renal/GU negative Renal ROS     Musculoskeletal negative musculoskeletal ROS (+)   Abdominal Normal abdominal exam  (+)   Peds  Hematology negative hematology ROS (+)   Anesthesia Other Findings   Reproductive/Obstetrics                            Anesthesia Physical Anesthesia Plan  ASA: III  Anesthesia Plan: MAC   Post-op Pain Management:    Induction: Intravenous  PONV Risk Score and Plan: 0 and Propofol infusion  Airway Management Planned: Natural Airway and Nasal Cannula  Additional Equipment: None  Intra-op Plan:   Post-operative Plan:   Informed Consent: I have reviewed the patients History and Physical, chart, labs and discussed the procedure including the risks, benefits and alternatives for the proposed anesthesia with the patient or authorized representative who has indicated his/her understanding and acceptance.       Plan Discussed with: CRNA  Anesthesia Plan Comments:        Anesthesia Quick Evaluation

## 2021-02-04 NOTE — Anesthesia Postprocedure Evaluation (Signed)
Anesthesia Post Note  Patient: Jean Pitts  Procedure(s) Performed: ENTEROSCOPY (N/A ) FOREIGN BODY REMOVAL     Patient location during evaluation: PACU Anesthesia Type: MAC Level of consciousness: awake and alert Pain management: pain level controlled Vital Signs Assessment: post-procedure vital signs reviewed and stable Respiratory status: spontaneous breathing, nonlabored ventilation, respiratory function stable and patient connected to nasal cannula oxygen Cardiovascular status: stable and blood pressure returned to baseline Postop Assessment: no apparent nausea or vomiting Anesthetic complications: no   No complications documented.  Last Vitals:  Vitals:   02/04/21 0922 02/04/21 0939  BP: 118/78 106/66  Pulse: 60 (!) 56  Resp: 19 16  Temp:  36.7 C  SpO2: 100% 100%    Last Pain:  Vitals:   02/04/21 0922  TempSrc:   PainSc: 0-No pain                 Effie Berkshire

## 2021-02-04 NOTE — Progress Notes (Signed)
Pt. Discharge via wheelchair accompanied by staff and spouse. Pt. Is alert and oriented, no distress. Discharge instruction and education discussed with patient. Pt. Verbalized understanding. All personal belonging are with patient

## 2021-02-04 NOTE — Discharge Instructions (Signed)
YOU HAD AN ENDOSCOPIC PROCEDURE TODAY: Refer to the procedure report and other information in the discharge instructions given to you for any specific questions about what was found during the examination. If this information does not answer your questions, please call [place] office at [phone] to clarify.   YOU SHOULD EXPECT: Some feelings of bloating in the abdomen. Passage of more gas than usual. Walking can help get rid of the air that was put into your GI tract during the procedure and reduce the bloating. If you had a lower endoscopy (such as a colonoscopy or flexible sigmoidoscopy) you may notice spotting of blood in your stool or on the toilet paper. Some abdominal soreness may be present for a day or two, also.  DIET: Your first meal following the procedure should be a light meal and then it is ok to progress to your normal diet. A half-sandwich or bowl of soup is an example of a good first meal. Heavy or fried foods are harder to digest and may make you feel nauseous or bloated. Drink plenty of fluids but you should avoid alcoholic beverages for 24 hours. If you had a esophageal dilation, please see attached instructions for diet.    ACTIVITY: Your care partner should take you home directly after the procedure. You should plan to take it easy, moving slowly for the rest of the day. You can resume normal activity the day after the procedure however YOU SHOULD NOT DRIVE, use power tools, machinery or perform tasks that involve climbing or major physical exertion for 24 hours (because of the sedation medicines used during the test).   SYMPTOMS TO REPORT IMMEDIATELY: A gastroenterologist can be reached at any hour. Please call [phone]  for any of the following symptoms:   Following upper endoscopy (EGD, EUS, ERCP, esophageal dilation) Vomiting of blood or coffee ground material  New, significant abdominal pain  New, significant chest pain or pain under the shoulder blades  Painful or  persistently difficult swallowing  New shortness of breath  Black, tarry-looking or red, bloody stools  FOLLOW UP:  If any biopsies were taken you will be contacted by phone or by letter within the next 1-3 weeks. Call [phone]  if you have not heard about the biopsies in 3 weeks.  Please also call with any specific questions about appointments or follow up tests.  

## 2021-02-04 NOTE — Plan of Care (Signed)
  Problem: Education: Goal: Knowledge of General Education information will improve Description: Including pain rating scale, medication(s)/side effects and non-pharmacologic comfort measures 02/04/2021 1325 by Mohammed Kindle, RN Outcome: Adequate for Discharge 02/04/2021 1324 by Mohammed Kindle, RN Outcome: Adequate for Discharge   Problem: Health Behavior/Discharge Planning: Goal: Ability to manage health-related needs will improve 02/04/2021 1325 by Mohammed Kindle, RN Outcome: Adequate for Discharge 02/04/2021 1324 by Mohammed Kindle, RN Outcome: Adequate for Discharge   Problem: Clinical Measurements: Goal: Ability to maintain clinical measurements within normal limits will improve 02/04/2021 1325 by Mohammed Kindle, RN Outcome: Adequate for Discharge 02/04/2021 1324 by Mohammed Kindle, RN Outcome: Adequate for Discharge Goal: Will remain free from infection 02/04/2021 1325 by Mohammed Kindle, RN Outcome: Adequate for Discharge 02/04/2021 1324 by Mohammed Kindle, RN Outcome: Adequate for Discharge Goal: Diagnostic test results will improve 02/04/2021 1325 by Mohammed Kindle, RN Outcome: Adequate for Discharge 02/04/2021 1324 by Mohammed Kindle, RN Outcome: Adequate for Discharge Goal: Respiratory complications will improve 02/04/2021 1325 by Mohammed Kindle, RN Outcome: Adequate for Discharge 02/04/2021 1324 by Mohammed Kindle, RN Outcome: Adequate for Discharge Goal: Cardiovascular complication will be avoided 02/04/2021 1325 by Mohammed Kindle, RN Outcome: Adequate for Discharge 02/04/2021 1324 by Mohammed Kindle, RN Outcome: Adequate for Discharge   Problem: Activity: Goal: Risk for activity intolerance will decrease 02/04/2021 1325 by Mohammed Kindle, RN Outcome: Adequate for Discharge 02/04/2021 1324 by Mohammed Kindle, RN Outcome: Adequate for Discharge   Problem: Nutrition: Goal: Adequate nutrition will be  maintained 02/04/2021 1325 by Mohammed Kindle, RN Outcome: Adequate for Discharge 02/04/2021 1324 by Mohammed Kindle, RN Outcome: Adequate for Discharge   Problem: Coping: Goal: Level of anxiety will decrease 02/04/2021 1325 by Mohammed Kindle, RN Outcome: Adequate for Discharge 02/04/2021 1324 by Mohammed Kindle, RN Outcome: Adequate for Discharge   Problem: Elimination: Goal: Will not experience complications related to bowel motility 02/04/2021 1325 by Mohammed Kindle, RN Outcome: Adequate for Discharge 02/04/2021 1324 by Mohammed Kindle, RN Outcome: Adequate for Discharge Goal: Will not experience complications related to urinary retention 02/04/2021 1325 by Mohammed Kindle, RN Outcome: Adequate for Discharge 02/04/2021 1324 by Mohammed Kindle, RN Outcome: Adequate for Discharge   Problem: Pain Managment: Goal: General experience of comfort will improve 02/04/2021 1325 by Mohammed Kindle, RN Outcome: Adequate for Discharge 02/04/2021 1324 by Mohammed Kindle, RN Outcome: Adequate for Discharge   Problem: Safety: Goal: Ability to remain free from injury will improve 02/04/2021 1325 by Mohammed Kindle, RN Outcome: Adequate for Discharge 02/04/2021 1324 by Mohammed Kindle, RN Outcome: Adequate for Discharge   Problem: Skin Integrity: Goal: Risk for impaired skin integrity will decrease 02/04/2021 1325 by Mohammed Kindle, RN Outcome: Adequate for Discharge 02/04/2021 1324 by Mohammed Kindle, RN Outcome: Adequate for Discharge

## 2021-02-04 NOTE — Discharge Summary (Addendum)
Physician Discharge Summary  Jean Pitts:[id number] DOB: [birth year] DOA: 01/30/2021  PCP: Pleas Koch, NP  Admit date: 01/30/2021 Discharge date: 02/04/2021  Discharge disposition: Home   Recommendations for Outpatient Follow-Up:   Follow-up with PCP in 1 week Follow-up with Dr. Bonna Gains, gastroenterologist, in 1 to 2 weeks   Discharge Diagnosis:   Principal Problem:   Orthostatic intolerance Active Problems:   Migraine without aura and without status migrainosus, not intractable   S/P bariatric surgery   Marginal ulcer   Symptomatic anemia   History of Roux-en-Y gastric bypass   Foreign body in intestine    Discharge Condition: Stable.  Diet recommendation:  Diet Order            DIET SOFT Room service appropriate? Yes; Fluid consistency: Thin  Diet effective now           Diet Heart                   Code Status: Full Code     Hospital Course:   Jean Pitts is a 35 year old woman with medical history significant for morbid obesity, migraine headache, GERD, recently diagnosed with anastomotic gastric ulcer, who presented to the hospital with melena, dizziness, fatigue, generalized weakness and elevated heart rate.  She was found to have acute on chronic anemia, orthostatic tachycardia and orthostatic hypotension.  She was treated with IV Protonix, IV fluids and IV iron infusion.  She was seen by the gastroenterologist and she underwent EGD.  EGD findings are as follows  "Gastric bypass with a normal-sized pouch. Gastrojejunal anastomosis characterized by healthy appearing mucosa. Four staples at the anastomosis were removed. - Non-bleeding gastric ulcer with a clean ulcer base (Forrest Class III). Widely patent previous surgical anastomosis, characterized by healthy appearing mucosa was found in the jejunum".  Her condition has improved significantly and she is deemed stable for discharge to home. She requested a work note for herself and  her husband. She was informed that work note for her husband will have her name and date of birth on the work note template, and she said she was okay with that.  Both patient and her husband were provided with work notes.   Medical Consultants:    Gastroenterologist   Discharge Exam:    Vitals:   02/04/21 0900 02/04/21 0910 02/04/21 0922 02/04/21 0939  BP: (!) 106/50 120/68 118/78 106/66  Pulse: 73 (!) 58 60 (!) 56  Resp: 12 18 19 16   Temp:    98.1 F (36.7 C)  TempSrc:      SpO2: 100% 100% 100% 100%  Weight:      Height:         GEN: NAD SKIN: Warm and dry EYES: No pallor or icterus ENT: MMM CV: RRR PULM: CTA B ABD: soft, ND, NT, +BS CNS: AAO x 3, non focal EXT: No edema or tenderness   The results of significant diagnostics from this hospitalization (including imaging, microbiology, ancillary and laboratory) are listed below for reference.     Procedures and Diagnostic Studies:   DG Chest Port 1 View  Result Date: 01/31/2021 CLINICAL DATA:  Palpitations EXAM: PORTABLE CHEST 1 VIEW COMPARISON:  None. FINDINGS: Normal heart size and mediastinal contours. No acute infiltrate or edema. No effusion or pneumothorax. No acute osseous findings. IMPRESSION: Negative chest. Electronically Signed   By: Monte Fantasia M.D.   On: 01/31/2021 06:21     Labs:   Basic Metabolic Panel: Recent Labs  Lab 01/30/21 1944 02/02/21 0538  NA 137 142  K 3.6 4.3  CL 105 109  CO2 25 26  GLUCOSE 111* 81  BUN 7 8  CREATININE 0.72 0.59  CALCIUM 9.7 8.5*  MG  --  1.8  PHOS  --  3.0   GFR Estimated Creatinine Clearance: 116.7 mL/min (by C-G formula based on SCr of 0.59 mg/dL). Liver Function Tests: Recent Labs  Lab 01/30/21 1944  AST 36  ALT 32  ALKPHOS 100  BILITOT 0.2*  PROT 8.3*  ALBUMIN 4.3   No results for input(s): LIPASE, AMYLASE in the last 168 hours. No results for input(s): AMMONIA in the last 168 hours. Coagulation profile No results for input(s): INR,  PROTIME in the last 168 hours.  CBC: Recent Labs  Lab 01/30/21 1944 01/31/21 0621 01/31/21 1702 02/02/21 0538 02/02/21 1650 02/03/21 0638 02/03/21 1705 02/04/21 0449  WBC 10.5 8.1  --  6.8  --  6.1  --   --   NEUTROABS 7.3  --   --   --   --   --   --   --   HGB 10.6* 9.1*   < > 8.1* 8.6* 8.2* 9.3* 8.8*  HCT 33.6* 29.2*   < > 27.2* 29.0* 26.8* 30.7* 28.6*  MCV 85.7 85.6  --  88.0  --  88.7  --   --   PLT 503* 430*  --  341  --  319  --   --    < > = values in this interval not displayed.   Cardiac Enzymes: No results for input(s): CKTOTAL, CKMB, CKMBINDEX, TROPONINI in the last 168 hours. BNP: Invalid input(s): POCBNP CBG: No results for input(s): GLUCAP in the last 168 hours. D-Dimer No results for input(s): DDIMER in the last 72 hours. Hgb A1c No results for input(s): HGBA1C in the last 72 hours. Lipid Profile No results for input(s): CHOL, HDL, LDLCALC, TRIG, CHOLHDL, LDLDIRECT in the last 72 hours. Thyroid function studies No results for input(s): TSH, T4TOTAL, T3FREE, THYROIDAB in the last 72 hours.  Invalid input(s): FREET3 Anemia work up No results for input(s): VITAMINB12, FOLATE, FERRITIN, TIBC, IRON, RETICCTPCT in the last 72 hours. Microbiology Recent Results (from the past 240 hour(s))  SARS CORONAVIRUS 2 (TAT 6-24 HRS) Nasopharyngeal Nasopharyngeal Swab     Status: None   Collection Time: 01/31/21  8:16 AM   Specimen: Nasopharyngeal Swab  Result Value Ref Range Status   SARS Coronavirus 2 NEGATIVE NEGATIVE Final    Comment: (NOTE) SARS-CoV-2 target nucleic acids are NOT DETECTED.  The SARS-CoV-2 RNA is generally detectable in upper and lower respiratory specimens during the acute phase of infection. Negative results do not preclude SARS-CoV-2 infection, do not rule out co-infections with other pathogens, and should not be used as the sole basis for treatment or other patient management decisions. Negative results must be combined with clinical  observations, patient history, and epidemiological information. The expected result is Negative.  Fact Sheet for Patients: SugarRoll.be  Fact Sheet for Healthcare Providers: https://www.woods-mathews.com/  This test is not yet approved or cleared by the [place] FDA and  has been authorized for detection and/or diagnosis of SARS-CoV-2 by FDA under an Emergency Use Authorization (EUA). This EUA will remain  in effect (meaning this test can be used) for the duration of the COVID-19 declaration under Se ction 564(b)(1) of the Act, 21 U.S.C. section 360bbb-3(b)(1), unless the authorization is terminated or revoked sooner.  Performed at [facility]  [facility], [address] [place], [place] [address]      Discharge Instructions:   Discharge Instructions    Diet Heart   Complete by: As directed    Increase activity slowly   Complete by: As directed    Schedule appointment   Complete by: As directed    Follow up with PCP in 1 week     Allergies as of 02/04/2021      Reactions   Codeine Other (See Comments)   Other reaction(s): Other (See Comments)   Hydrocodone-acetaminophen Nausea And Vomiting      Medication List    TAKE these medications   esomeprazole 40 MG capsule Commonly known as: NEXIUM Take 1 capsule (40 mg total) by mouth 2 (two) times daily.   ferrous sulfate 325 (65 FE) MG tablet Take 1 tablet (325 mg total) by mouth every other day. What changed:   how much to take  when to take this   fluconazole 150 MG tablet Commonly known as: DIFLUCAN Take 1 tablet (150 mg total) by mouth once a week. As preventive   propranolol ER 80 MG 24 hr capsule Commonly known as: INDERAL LA TAKE 1 CAPSULE (80 MG TOTAL) BY MOUTH DAILY. FOR HEADACHE PREVENTION.   sucralfate 1 GM/10ML suspension Commonly known as: CARAFATE TAKE 10 MLS (1 G TOTAL) BY MOUTH 4 (FOUR) TIMES DAILY.   SUMAtriptan Succinate Refill 6 MG/0.5ML  Soct Inject 0.5 mLs into the skin daily as needed (migraines).   Vitamin D (Ergocalciferol) 1.25 MG (50000 UNIT) Caps capsule Commonly known as: DRISDOL Take 1 capsule (50,000 Units total) by mouth every 7 (seven) days. Wednesday       Follow-up Information    Virgel Manifold, MD. Schedule an appointment as soon as possible for a visit in 1 week(s).   Specialty: Gastroenterology Contact information: [place] [address] [phone]                Time coordinating discharge: 32 minutes  Signed:  Jennye Boroughs  [facility] 02/04/2021, 1:17 PM   Pager on [email or web address]. If 7PM-7AM, please contact night-coverage at [email or web address]

## 2021-02-04 NOTE — Op Note (Signed)
[facility] Patient Name: Jean Pitts Procedure Date: 02/04/2021 MRN: [id number] Attending MD: Ladene Artist , MD Date of Birth: [birth year] CSN: [id number] Age: 35 Admit Type: Inpatient Procedure:                Small bowel enteroscopy Indications:              Melena Providers:                Pricilla Riffle. Fuller Plan, MD, Baird Cancer, RN, Laverda Sorenson, Technician, Danley Danker, CRNA Referring MD:             [facility] Medicines:                Monitored Anesthesia Care Complications:            No immediate complications. Estimated blood loss:                            Minimal. Estimated Blood Loss:     Estimated blood loss was minimal. Procedure:                Pre-Anesthesia Assessment:                           - Prior to the procedure, a History and Physical                            was performed, and patient medications and                            allergies were reviewed. The patient's tolerance of                            previous anesthesia was also reviewed. The risks                            and benefits of the procedure and the sedation                            options and risks were discussed with the patient.                            All questions were answered, and informed consent                            was obtained. Prior Anticoagulants: The patient has                            taken no previous anticoagulant or antiplatelet                            agents. ASA Grade Assessment: II - A patient with  mild systemic disease. After reviewing the risks                            and benefits, the patient was deemed in                            satisfactory condition to undergo the procedure.                           After obtaining informed consent, the endoscope was                            passed under direct vision. Throughout the                            procedure, the patient's  blood pressure, pulse, and                            oxygen saturations were monitored continuously. The                            [id number]-[id number] ([id number]) Olympus pediatric colonscope                            was introduced through the mouth and advanced to                            the efferent jejunal loop. The small bowel                            enteroscopy was accomplished without difficulty.                            The patient tolerated the procedure well. Scope In: Scope Out: Findings:      The examined esophagus was normal.      Evidence of a gastric bypass was found. A gastric pouch with a normal       size was found, 5 cm. The gastrojejunal anastomosis was characterized by       healthy appearing mucosa. There were 4 staples at the anastomosis which       were removed. 3 were retrieved. This anastomsis was traversed. The       jejunum limb was examined to the jejunum-jejunum anastomosis which       appeared normal.      One non-bleeding superficial gastric ulcer with a clean ulcer base       (Forrest Class III) was found at the anastomosis. The lesion was 7 mm in       largest dimension.      There was evidence of a widely patent previous surgical anastomosis in       the efferent jejunal loop. This was characterized by healthy appearing       mucosa. Impression:               - Normal esophagus.                           -  Gastric bypass with a normal-sized pouch.                            Gastrojejunal anastomosis characterized by healthy                            appearing mucosa. Four staples at the anastomosis                            were removed.                           - Non-bleeding gastric ulcer with a clean ulcer                            base (Forrest Class III).                           - Widely patent previous surgical anastomosis,                            characterized by healthy appearing mucosa was found                            in the  jejunum.                           - No specimens collected. Recommendation:           - Return patient to hospital ward for ongoing care.                           - Full liquid diet today, then advance as tolerated                            to soft diet today.                           - Return to GI office at appointment to be                            scheduled with her primary gastroenterologist Dr.                            [name].                           - Continue Nexium 40 mg po bid, open capsule in                            applesauce.                           - Continue Carafate 1 g suspension qid.                           -  Avoid NSAIDs.                           - OK for discharge from a GI standpoint. Procedure Code(s):        --- Professional ---                           [phone], Small intestinal endoscopy, enteroscopy                            beyond second portion of duodenum, not including                            ileum; diagnostic, including collection of                            specimen(s) by brushing or washing, when performed                            (separate procedure) Diagnosis Code(s):        --- Professional ---                           H37.16, Bariatric surgery status                           K25.9, Gastric ulcer, unspecified as acute or                            chronic, without hemorrhage or perforation                           Z98.0, Intestinal bypass and anastomosis status                           K92.1, Melena (includes Hematochezia) CPT copyright 2019 American Medical Association. All rights reserved. The codes documented in this report are preliminary and upon coder review may  be revised to meet current compliance requirements. Ladene Artist, MD 02/04/2021 9:11:48 AM This report has been signed electronically. Number of Addenda: 0

## 2021-02-05 ENCOUNTER — Other Ambulatory Visit: Payer: Self-pay

## 2021-02-05 ENCOUNTER — Telehealth: Payer: Self-pay

## 2021-02-05 ENCOUNTER — Ambulatory Visit (INDEPENDENT_AMBULATORY_CARE_PROVIDER_SITE_OTHER): Payer: BLUE CROSS/BLUE SHIELD | Admitting: Gastroenterology

## 2021-02-05 ENCOUNTER — Encounter (HOSPITAL_COMMUNITY): Payer: Self-pay | Admitting: Gastroenterology

## 2021-02-05 VITALS — BP 101/66 | HR 83 | Temp 98.7°F | Ht 64.0 in | Wt 246.2 lb

## 2021-02-05 DIAGNOSIS — D509 Iron deficiency anemia, unspecified: Secondary | ICD-10-CM

## 2021-02-05 DIAGNOSIS — K289 Gastrojejunal ulcer, unspecified as acute or chronic, without hemorrhage or perforation: Secondary | ICD-10-CM

## 2021-02-05 NOTE — Patient Instructions (Signed)
We will refer you to see Dr. Janese Banks and Dr. Margart Sickles. Their clinic should be contacting you soon.

## 2021-02-05 NOTE — Telephone Encounter (Signed)
Referral were sent to Dr. Janese Banks and Dr. Margart Sickles at [facility]: Fredrik Rigger, MD [id number] [place], [place] [address] [phone] (Fax)   They will  Be contacting the patient for an appointment date and time.

## 2021-02-05 NOTE — Progress Notes (Signed)
Jean Antigua, MD [address]  [place]  [place], [place] [address]  Main: [phone]  Fax: [phone]   Primary Care Physician: Pleas Koch, NP   Chief Complaint  Patient presents with  . Follow-up    Rectal bleeding.    HPI: Jean Pitts is a 35 y.o. female here for follow-up of iron deficiency anemia, melena, anastomotic ulcer at Roux-en-Y gastric bypass site.  Patient was discharged from the hospital in [facility] yesterday after being admitted for recurrent melena anemia with small bowel endoscopy done by Dr. Fuller Plan showing superficial clean-based ulcer at anastomosis site.  Jejunal jejunal limb was noted to be normal.  Staples were removed from gastrojejunal anastomosis site.  Patient is receiving IV Protonix as an inpatient and has not picked up her p.o. prescription since being discharged from the hospital yesterday.  Has been taking sucralfate.  Has been receiving IV iron transfusions as an inpatient and has an outpatient prior to hospital admission.  No hematemesis.  Is on oral iron.  Current Outpatient Medications  Medication Sig Dispense Refill  . esomeprazole (NEXIUM) 40 MG capsule Take 1 capsule (40 mg total) by mouth 2 (two) times daily. 120 capsule 0  . ferrous sulfate 325 (65 FE) MG tablet Take 1 tablet (325 mg total) by mouth every other day.  3  . fluconazole (DIFLUCAN) 150 MG tablet Take 1 tablet (150 mg total) by mouth once a week. As preventive 12 tablet 0  . propranolol ER (INDERAL LA) 80 MG 24 hr capsule TAKE 1 CAPSULE (80 MG TOTAL) BY MOUTH DAILY. FOR HEADACHE PREVENTION. 90 capsule 0  . sucralfate (CARAFATE) 1 GM/10ML suspension TAKE 10 MLS (1 G TOTAL) BY MOUTH 4 (FOUR) TIMES DAILY. 1200 mL 0  . SUMAtriptan Succinate Refill 6 MG/0.5ML SOCT Inject 0.5 mLs into the skin daily as needed (migraines).    . Vitamin D, Ergocalciferol, (DRISDOL) 1.25 MG (50000 UNIT) CAPS capsule Take 1 capsule (50,000 Units total) by mouth every 7 (seven) days.  Wednesday     No current facility-administered medications for this visit.    Allergies as of 02/05/2021 - Review Complete 02/05/2021  Allergen Reaction Noted  . Codeine Other (See Comments) 04/12/2014  . Hydrocodone-acetaminophen Nausea And Vomiting 06/26/2015    ROS:  General: Negative for anorexia, weight loss, fever, chills, fatigue, weakness. ENT: Negative for hoarseness, difficulty swallowing , nasal congestion. CV: Negative for chest pain, angina, palpitations, dyspnea on exertion, peripheral edema.  Respiratory: Negative for dyspnea at rest, dyspnea on exertion, cough, sputum, wheezing.  GI: See history of present illness. GU:  Negative for dysuria, hematuria, urinary incontinence, urinary frequency, nocturnal urination.  Endo: Negative for unusual weight change.    Physical Examination:   BP 101/66   Pulse 83   Temp 98.7 F (37.1 C) (Oral)   Ht 5\' 4"  (1.626 m)   Wt 246 lb 3.2 oz (111.7 kg)   BMI 42.26 kg/m   General: Well-nourished, well-developed in no acute distress.  Eyes: No icterus. Conjunctivae pink. Mouth: Oropharyngeal mucosa moist and pink , no lesions erythema or exudate. Neck: Supple, Trachea midline Abdomen: Bowel sounds are normal, nontender, nondistended, no hepatosplenomegaly or masses, no abdominal bruits or hernia , no rebound or guarding.   Extremities: No lower extremity edema. No clubbing or deformities. Neuro: Alert and oriented x 3.  Grossly intact. Skin: Warm and dry, no jaundice.   Psych: Alert and cooperative, normal mood and affect.   Labs: CMP  Component Value Date/Time   NA 142 02/02/2021 0538   NA 140 06/25/2020 1521   K 4.3 02/02/2021 0538   CL 109 02/02/2021 0538   CO2 26 02/02/2021 0538   GLUCOSE 81 02/02/2021 0538   BUN 8 02/02/2021 0538   BUN 8 06/25/2020 1521   CREATININE 0.59 02/02/2021 0538   CALCIUM 8.5 (L) 02/02/2021 0538   PROT 8.3 (H) 01/30/2021 1944   PROT 7.0 06/25/2020 1521   ALBUMIN 4.3 01/30/2021 1944    ALBUMIN 4.3 06/25/2020 1521   AST 36 01/30/2021 1944   ALT 32 01/30/2021 1944   ALKPHOS 100 01/30/2021 1944   BILITOT 0.2 (L) 01/30/2021 1944   BILITOT 0.2 06/25/2020 1521   GFRNONAA >60 02/02/2021 0538   GFRAA 118 06/25/2020 1521   Lab Results  Component Value Date   WBC 6.1 02/03/2021   HGB 8.8 (L) 02/04/2021   HCT 28.6 (L) 02/04/2021   MCV 88.7 02/03/2021   PLT 319 02/03/2021    Imaging Studies: DG Chest Port 1 View  Result Date: 01/31/2021 CLINICAL DATA:  Palpitations EXAM: PORTABLE CHEST 1 VIEW COMPARISON:  None. FINDINGS: Normal heart size and mediastinal contours. No acute infiltrate or edema. No effusion or pneumothorax. No acute osseous findings. IMPRESSION: Negative chest. Electronically Signed   By: Monte Fantasia M.D.   On: 01/31/2021 06:21    Assessment and Plan:   Jean Pitts is a 35 y.o. y/o female with history of Roux-en-Y gastric bypass, anastomotic ulcer and iron deficiency anemia here for follow-up  We will schedule for small bowel capsule study given ongoing iron deficiency anemia Schedule referral for hematology for anemia  Avoid NSAID use such as Ibuprofen, Aleeve, advil, motrin, BC and Goodie powder, Naproxen, Meloxicam and others.   We will place referral to her bypass surgeon, Dr. Margart Sickles, given persistent anastomotic ulcer and ongoing iron deficiency anemia  Proper PPI twice daily use discussed Continue sucralfate  (Risks of PPI use were discussed with patient including bone loss, C. Diff diarrhea, pneumonia, infections, CKD, electrolyte abnormalities.  Pt. Verbalizes understanding and chooses to continue the medication.)   Dr Jean Pitts

## 2021-02-06 NOTE — Telephone Encounter (Signed)
Patient says she was hospitalized for 6 days. Her job demands documentation of her condition. She's needs a call from the doctor to see what documentation can be provided in order to save her job.

## 2021-02-11 ENCOUNTER — Inpatient Hospital Stay: Payer: BLUE CROSS/BLUE SHIELD | Attending: Oncology | Admitting: Oncology

## 2021-02-11 ENCOUNTER — Inpatient Hospital Stay: Payer: BLUE CROSS/BLUE SHIELD

## 2021-02-11 ENCOUNTER — Encounter: Payer: Self-pay | Admitting: Oncology

## 2021-02-11 VITALS — BP 108/76 | HR 69 | Temp 98.4°F | Resp 18 | Ht 64.0 in | Wt 243.0 lb

## 2021-02-11 DIAGNOSIS — D500 Iron deficiency anemia secondary to blood loss (chronic): Secondary | ICD-10-CM | POA: Insufficient documentation

## 2021-02-11 DIAGNOSIS — Z9884 Bariatric surgery status: Secondary | ICD-10-CM | POA: Insufficient documentation

## 2021-02-11 DIAGNOSIS — D509 Iron deficiency anemia, unspecified: Secondary | ICD-10-CM

## 2021-02-11 NOTE — Progress Notes (Signed)
Pt here to establish care for IDA.

## 2021-02-11 NOTE — Progress Notes (Signed)
Hematology/Oncology Consult note [facility] [phone] [phone]   Patient Care Team: Pleas Koch, NP as PCP - General (Internal Medicine) Dorena Dew, MD as Referring Physician (Gastroenterology)  REFERRING PROVIDER: Virgel Manifold, MD CHIEF COMPLAINTS/REASON FOR VISIT:  Evaluation of iron deficiency anemia  HISTORY OF PRESENTING ILLNESS:  Jean Pitts is a  35 y.o.  female with PMH listed below was seen in consultation at the request of Virgel Manifold, MD   for evaluation of iron deficiency anemia.   Patient has history of gastric bypass, Diagnosed with anastomotic gastric ulcer..  She was recently admitted to due to melena, dizziness, fatigue and generalized weakness.  She was found to have acute drop of her hemoglobin.  She was treated with IV Feraheme x2 doses..  Patient was seen by gastroenterology and underwent an EGD which showed gastrojejunal anastomosis characterized by healthy appearing mucosa.  Nonbleeding gastric ulcer She was discharged on 02/04/2021 with hemoglobin of 8.8.  Patient reports significant improvement of energy level after her first Feraheme treatment.  Still feel very fatigued and tired. Denies any additional black or bloody bowel movement since discharge.   Review of Systems  Constitutional: Positive for fatigue. Negative for appetite change, chills and fever.  HENT:   Negative for hearing loss and voice change.   Eyes: Negative for eye problems.  Respiratory: Negative for chest tightness and cough.   Cardiovascular: Negative for chest pain.  Gastrointestinal: Negative for abdominal distention, abdominal pain and Pitts in stool.       Recent history of melena  Endocrine: Negative for hot flashes.  Genitourinary: Negative for difficulty urinating and frequency.   Musculoskeletal: Negative for arthralgias.  Skin: Negative for itching and rash.  Neurological: Negative for extremity  weakness.  Hematological: Negative for adenopathy.  Psychiatric/Behavioral: Negative for confusion.    MEDICAL HISTORY:  Past Medical History:  Diagnosis Date  . Abnormal glucose tolerance test in pregnancy, antepartum 06/04/2012   Formatting of this note might be different from the original. Needs 3h GTT  . B12 deficiency   . Family history of adverse reaction to anesthesia    MATERNAL AUNT-PT UNAWARE OF WHAT HAPPENED WITH ANESTHESIA BUT STATES HER AUNT HAS HAD PROBLEMS IN PAST   . Fecal occult Pitts test positive   . Genital herpes   . GERD (gastroesophageal reflux disease)   . H/O cesarean section complicating pregnancy 99991111  . Hair thinning 05/12/2018  . Headache    MIGRAINE  . Heartburn 10/26/2018  . Hyperlipidemia   . Hypertension   . Migraine   . Obesity   . PONV (postoperative nausea and vomiting)   . Syncope 04-2015  . Vitamin D deficiency   . Yeast vaginitis 02/23/2019   Candida albicans on culture    SURGICAL HISTORY: Past Surgical History:  Procedure Laterality Date  . APPENDECTOMY    . BUNIONECTOMY    . CESAREAN SECTION     X2  . COLONOSCOPY WITH PROPOFOL N/A 12/31/2020   Procedure: COLONOSCOPY WITH PROPOFOL. eleview and spot ink tattoo used.;  Surgeon: Virgel Manifold, MD;  Location: [place];  Service: Endoscopy;  Laterality: N/A;  . DIAGNOSTIC LAPAROSCOPY    . ENDOMETRIAL BIOPSY    . ENTEROSCOPY N/A 01/23/2021   Procedure: ENTEROSCOPY;  Surgeon: Virgel Manifold, MD;  Location[facility] END[facility]COPY;  Service: Endoscopy;  Laterality: N/A;  . ENTEROSCOPY N/A 02/04/2021   Procedure: ENTEROSCOPY;  Surgeon: Ladene Artist, MD;  Location: [facility];  Service: Gastroenterology;  Laterality: N/A;  . ESOPHAGOGASTRODUODENOSCOPY (EGD) WITH PROPOFOL N/A 12/31/2020   Procedure: ESOPHAGOGASTRODUODENOSCOPY (EGD) WITH PROPOFOL;  Surgeon: Virgel Manifold, MD;  Location: [place];  Service: Endoscopy;  Laterality: N/A;  . FOREIGN BODY REMOVAL   02/04/2021   Procedure: FOREIGN BODY REMOVAL;  Surgeon: Ladene Artist, MD;  Location: [facility];  Service: Gastroenterology;;  staples from other gastric procedure  . GASTRIC BYPASS    . LASIK    . MOUTH SURGERY     Tongue Cyst  . NOVASURE ABLATION N/A 06/29/2015   Procedure: DILATION AND CURETTAGE, HYSTEROSCOPY, NOVASURE ABLATION;  Surgeon: Malachy Mood, MD;  Location: [facility];  Service: Gynecology;  Laterality: N/A;  . TUBAL LIGATION    . WISDOM TOOTH EXTRACTION      SOCIAL HISTORY: Social History   Socioeconomic History  . Marital status: Married    Spouse name: Not on file  . Number of children: 2  . Years of education: Not on file  . Highest education level: Some college, no degree  Occupational History  . Occupation: Multimedia programmer   Tobacco Use  . Smoking status: Never Smoker  . Smokeless tobacco: Never Used  Vaping Use  . Vaping Use: Never used  Substance and Sexual Activity  . Alcohol use: Not Currently    Comment: RARE  . Drug use: No  . Sexual activity: Yes    Partners: Male    Birth control/protection: Surgical    Comment: Ablation  Other Topics Concern  . Not on file  Social History Narrative      She also goes to school full time at [employer] Strain: Not on file  Food Insecurity: Not on file  Transportation Needs: Not on file  Physical Activity: Not on file  Stress: Not on file  Social Connections: Not on file  Intimate Partner Violence: Not on file    FAMILY HISTORY: Family History  Problem Relation Age of Onset  . Obesity Mother   . Hyperlipidemia Mother   . Obesity Sister   . Obesity Brother   . Obesity Sister   . Ovarian cancer Maternal Grandmother 26       Cervical/Sqammacell    ALLERGIES:  is allergic to codeine and hydrocodone-acetaminophen.  MEDICATIONS:  Current Outpatient Medications  Medication Sig Dispense Refill  . esomeprazole (NEXIUM) 40 MG capsule Take 1  capsule (40 mg total) by mouth 2 (two) times daily. 120 capsule 0  . ferrous sulfate 325 (65 FE) MG tablet Take 1 tablet (325 mg total) by mouth every other day.  3  . fluconazole (DIFLUCAN) 150 MG tablet Take 1 tablet (150 mg total) by mouth once a week. As preventive 12 tablet 0  . propranolol ER (INDERAL LA) 80 MG 24 hr capsule TAKE 1 CAPSULE (80 MG TOTAL) BY MOUTH DAILY. FOR HEADACHE PREVENTION. 90 capsule 0  . sucralfate (CARAFATE) 1 GM/10ML suspension TAKE 10 MLS (1 G TOTAL) BY MOUTH 4 (FOUR) TIMES DAILY. 1200 mL 0  . SUMAtriptan Succinate Refill 6 MG/0.5ML SOCT Inject 0.5 mLs into the skin daily as needed (migraines).    . Vitamin D, Ergocalciferol, (DRISDOL) 1.25 MG (50000 UNIT) CAPS capsule Take 1 capsule (50,000 Units total) by mouth every 7 (seven) days. Wednesday     No current facility-administered medications for this visit.     PHYSICAL EXAMINATION: ECOG PERFORMANCE STATUS: 1 - Symptomatic but completely ambulatory Vitals:   02/11/21 1523  BP: 108/76  Pulse: 69  Resp: 18  Temp: 98.4 F (36.9 C)   Filed Weights   02/11/21 1523  Weight: 243 lb (110.2 kg)    Physical Exam Constitutional:      General: She is not in acute distress. HENT:     Head: Normocephalic and atraumatic.  Eyes:     General: No scleral icterus. Cardiovascular:     Rate and Rhythm: Normal rate and regular rhythm.     Heart sounds: Normal heart sounds.  Pulmonary:     Effort: Pulmonary effort is normal. No respiratory distress.     Breath sounds: No wheezing.  Abdominal:     General: Bowel sounds are normal. There is no distension.     Palpations: Abdomen is soft.  Musculoskeletal:        General: No deformity. Normal range of motion.     Cervical back: Normal range of motion and neck supple.  Skin:    General: Skin is warm and dry.     Coloration: Skin is pale.     Findings: No erythema or rash.  Neurological:     Mental Status: She is alert and oriented to person, place, and time.  Mental status is at baseline.     Cranial Nerves: No cranial nerve deficit.     Coordination: Coordination normal.  Psychiatric:        Mood and Affect: Mood normal.       CMP Latest Ref Rng & Units 02/02/2021  Glucose 70 - 99 mg/dL 81  BUN 6 - 20 mg/dL 8  Creatinine 0.44 - 1.00 mg/dL 0.59  Sodium 135 - 145 mmol/L 142  Potassium 3.5 - 5.1 mmol/L 4.3  Chloride 98 - 111 mmol/L 109  CO2 22 - 32 mmol/L 26  Calcium 8.9 - 10.3 mg/dL 8.5(L)  Total Protein 6.5 - 8.1 g/dL -  Total Bilirubin 0.3 - 1.2 mg/dL -  Alkaline Phos 38 - 126 U/L -  AST 15 - 41 U/L -  ALT 0 - 44 U/L -   CBC Latest Ref Rng & Units 02/04/2021  WBC 4.0 - 10.5 K/uL -  Hemoglobin 12.0 - 15.0 g/dL 8.8(L)  Hematocrit 36.0 - 46.0 % 28.6(L)  Platelets 150 - 400 K/uL -     LABORATORY DATA:  I have reviewed the data as listed Lab Results  Component Value Date   WBC 6.1 02/03/2021   HGB 8.8 (L) 02/04/2021   HCT 28.6 (L) 02/04/2021   MCV 88.7 02/03/2021   PLT 319 02/03/2021   Recent Labs    06/25/20 1521 06/25/20 1521 12/20/20 1645 01/18/21 0841 01/30/21 1944 02/02/21 0538  NA 140   < > 140 140 137 142  K 4.3  --  5.0 4.2 3.6 4.3  CL 102  --  107 105 105 109  CO2 26  --  25 28 25 26   GLUCOSE 91   < > 92 81 111* 81  BUN 8   < > 20 10 7 8   CREATININE 0.76  --  0.75 0.64 0.72 0.59  CALCIUM 9.9  --  9.1 9.2 9.7 8.5*  GFRNONAA 103  --   --   --  >60 >60  GFRAA 118  --   --   --   --   --   PROT 7.0  --  6.9 6.8 8.3*  --   ALBUMIN 4.3  --  4.3 3.9 4.3  --   AST 22  --  18  20 36  --   ALT 15  --  14 18 32  --   ALKPHOS 117  --  73 99 100  --   BILITOT 0.2  --  0.2 0.4 0.2*  --    < > = values in this interval not displayed.   Iron/TIBC/Ferritin/ %Sat    Component Value Date/Time   IRON 29 01/31/2021 0815   IRON 101 06/25/2020 1521   TIBC 468 (H) 01/31/2021 0815   TIBC 440 06/25/2020 1521   FERRITIN 5 (L) 01/31/2021 0815   FERRITIN 17 06/25/2020 1521   IRONPCTSAT 6 (L) 01/31/2021 0815   IRONPCTSAT  23 06/25/2020 1521     RADIOGRAPHIC STUDIES: I have personally reviewed the radiological images as listed and agreed with the findings in the report. DG Chest Port 1 View  Result Date: 01/31/2021 CLINICAL DATA:  Palpitations EXAM: PORTABLE CHEST 1 VIEW COMPARISON:  None. FINDINGS: Normal heart size and mediastinal contours. No acute infiltrate or edema. No effusion or pneumothorax. No acute osseous findings. IMPRESSION: Negative chest. Electronically Signed   By: Monte Fantasia M.D.   On: 01/31/2021 06:21       ASSESSMENT & PLAN:  1. 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 H/O gastric bypass    She has received 2 doses of Feraheme.  Plan to repeat Pitts work in 2 weeks.  If no significant improvement,will consider additional Feraheme treatments.   Orders Placed This Encounter  Procedures  . Ferritin    Standing Status:   Future    Standing Expiration Date:   08/16/2021  . Iron and TIBC    Standing Status:   Future    Standing Expiration Date:   02/13/2022  . CBC with Differential/Platelet    Standing Status:   Future    Standing Expiration Date:   02/13/2022    All questions were answered. The patient knows to call the clinic with any problems questions or concerns.  Cc Virgel Manifold, MD  Return of visit:  To be determined.  Thank you for this kind referral and the opportunity to participate in the care of this patient. A copy of today's note is routed to referring provider   Earlie Server, MD, PhD Hematology Oncology [place] [facility] 02/11/2021

## 2021-02-14 ENCOUNTER — Other Ambulatory Visit: Payer: Self-pay | Admitting: Gastroenterology

## 2021-02-18 ENCOUNTER — Encounter: Payer: Self-pay | Admitting: Gastroenterology

## 2021-02-19 ENCOUNTER — Encounter
Admission: RE | Disposition: A | Discharge status: Home / Self Care | Payer: Self-pay | Source: Home / Self Care | Attending: Gastroenterology

## 2021-02-19 ENCOUNTER — Ambulatory Visit
Admission: RE | Admit: 2021-02-19 | Discharge: 2021-02-19 | Disposition: A | Discharge status: Home / Self Care | Payer: BLUE CROSS/BLUE SHIELD | Attending: Gastroenterology | Admitting: Gastroenterology

## 2021-02-19 ENCOUNTER — Encounter: Payer: Self-pay | Admitting: Oncology

## 2021-02-19 DIAGNOSIS — D509 Iron deficiency anemia, unspecified: Secondary | ICD-10-CM | POA: Insufficient documentation

## 2021-02-19 DIAGNOSIS — K921 Melena: Secondary | ICD-10-CM

## 2021-02-19 DIAGNOSIS — Z9884 Bariatric surgery status: Secondary | ICD-10-CM

## 2021-02-19 HISTORY — PX: GIVENS CAPSULE STUDY: SHX5432

## 2021-02-19 HISTORY — DX: Bariatric surgery status: Z98.84

## 2021-02-19 SURGERY — IMAGING PROCEDURE, GI TRACT, INTRALUMINAL, VIA CAPSULE

## 2021-02-20 ENCOUNTER — Encounter: Payer: Self-pay | Admitting: Gastroenterology

## 2021-02-20 ENCOUNTER — Other Ambulatory Visit: Payer: Self-pay | Admitting: Oncology

## 2021-02-21 ENCOUNTER — Encounter: Payer: Self-pay | Admitting: Gastroenterology

## 2021-02-26 ENCOUNTER — Inpatient Hospital Stay: Payer: BLUE CROSS/BLUE SHIELD

## 2021-02-26 DIAGNOSIS — D500 Iron deficiency anemia secondary to blood loss (chronic): Secondary | ICD-10-CM | POA: Diagnosis not present

## 2021-02-26 DIAGNOSIS — Z9884 Bariatric surgery status: Secondary | ICD-10-CM

## 2021-02-26 LAB — CBC WITH DIFFERENTIAL/PLATELET
Abs Immature Granulocytes: 0.02 10*3/uL (ref 0.00–0.07)
Basophils Absolute: 0.1 10*3/uL (ref 0.0–0.1)
Basophils Relative: 1 %
Eosinophils Absolute: 0.2 10*3/uL (ref 0.0–0.5)
Eosinophils Relative: 2 %
HCT: 34.6 % — ABNORMAL LOW (ref 36.0–46.0)
Hemoglobin: 11.1 g/dL — ABNORMAL LOW (ref 12.0–15.0)
Immature Granulocytes: 0 %
Lymphocytes Relative: 23 %
Lymphs Abs: 1.9 10*3/uL (ref 0.7–4.0)
MCH: 28.8 pg (ref 26.0–34.0)
MCHC: 32.1 g/dL (ref 30.0–36.0)
MCV: 89.9 fL (ref 80.0–100.0)
Monocytes Absolute: 0.6 10*3/uL (ref 0.1–1.0)
Monocytes Relative: 7 %
Neutro Abs: 5.5 10*3/uL (ref 1.7–7.7)
Neutrophils Relative %: 67 %
Platelets: 315 10*3/uL (ref 150–400)
RBC: 3.85 MIL/uL — ABNORMAL LOW (ref 3.87–5.11)
RDW: 19.4 % — ABNORMAL HIGH (ref 11.5–15.5)
WBC: 8.3 10*3/uL (ref 4.0–10.5)
nRBC: 0 % (ref 0.0–0.2)

## 2021-02-26 LAB — IRON AND TIBC
Iron: 82 ug/dL (ref 28–170)
Saturation Ratios: 23 % (ref 10.4–31.8)
TIBC: 360 ug/dL (ref 250–450)
UIBC: 278 ug/dL

## 2021-02-26 LAB — FERRITIN: Ferritin: 60 ng/mL (ref 11–307)

## 2021-02-27 ENCOUNTER — Telehealth: Payer: Self-pay

## 2021-02-27 NOTE — Telephone Encounter (Signed)
-----   Message from Earlie Server, MD sent at 02/26/2021  8:56 PM EDT ----- Please let patient know that iron lab showed improvement of iron stores.  Hemoglobin has also improved.  Recommend patient to follow-up with me in 3 months, iron labs prior, MD +/- Venofer

## 2021-02-27 NOTE — Telephone Encounter (Signed)
Patient notified via MyChart.  Please schedule as MD recommends.   

## 2021-03-04 ENCOUNTER — Encounter: Payer: Self-pay | Admitting: Oncology

## 2021-03-05 ENCOUNTER — Encounter: Payer: Self-pay | Admitting: Family Medicine

## 2021-03-05 ENCOUNTER — Other Ambulatory Visit: Payer: Self-pay

## 2021-03-05 ENCOUNTER — Telehealth (INDEPENDENT_AMBULATORY_CARE_PROVIDER_SITE_OTHER): Payer: BLUE CROSS/BLUE SHIELD | Admitting: Family Medicine

## 2021-03-05 DIAGNOSIS — J069 Acute upper respiratory infection, unspecified: Secondary | ICD-10-CM | POA: Diagnosis not present

## 2021-03-05 MED ORDER — AMOXICILLIN-POT CLAVULANATE 875-125 MG PO TABS: 1.0000 | ORAL_TABLET | Freq: Two times a day (BID) | ORAL | 0 refills | Status: DC

## 2021-03-05 MED ORDER — BENZONATATE 200 MG PO CAPS: 200.0000 mg | ORAL_CAPSULE | Freq: Three times a day (TID) | ORAL | 1 refills | Status: DC | PRN

## 2021-03-05 NOTE — Progress Notes (Signed)
Virtual visit completed through WebEx or similar program Patient location: home  Provider location: [place] at [facility], office  Participants: Patient and me (unless stated otherwise below)  Pandemic considerations d/w pt.   Limitations and rationale for visit method d/w patient.  Patient agreed to proceed.   CC:  URI sx.    HPI: sx started about 8 days ago.  First noted headache, post nasal gtt, ST, rhinorrhea.  Ear pressure bilaterally with ear popping.  Cough/sneezing. No fevers.  Not SOB.  Had covid vaccine prev.  She had neg covid test rest recently.  Some maxillary and frontal pain.  Able to swallow but ST noted.    Work d/w pt.    Meds and allergies reviewed.   ROS: Per HPI unless specifically indicated in ROS section   NAD Speech wnl  A/P:  Presumed sinusitis.   Start augmentin, use tessalon prn, rest and fluids.  She agrees with plan. Okay for outpatient f/u.

## 2021-03-06 ENCOUNTER — Other Ambulatory Visit: Payer: Self-pay | Admitting: Family Medicine

## 2021-03-06 ENCOUNTER — Encounter: Payer: Self-pay | Admitting: Family Medicine

## 2021-03-06 DIAGNOSIS — J069 Acute upper respiratory infection, unspecified: Secondary | ICD-10-CM | POA: Insufficient documentation

## 2021-03-06 MED ORDER — DOXYCYCLINE HYCLATE 100 MG PO TABS: 100.0000 mg | ORAL_TABLET | Freq: Two times a day (BID) | ORAL | 0 refills | Status: DC

## 2021-03-06 NOTE — Assessment & Plan Note (Signed)
Presumed sinusitis.   Start augmentin, use tessalon prn, rest and fluids.  She agrees with plan. Okay for outpatient f/u.

## 2021-03-13 ENCOUNTER — Ambulatory Visit: Payer: BLUE CROSS/BLUE SHIELD | Admitting: Gastroenterology

## 2021-04-06 ENCOUNTER — Other Ambulatory Visit: Payer: Self-pay | Admitting: Primary Care

## 2021-04-06 DIAGNOSIS — E559 Vitamin D deficiency, unspecified: Secondary | ICD-10-CM

## 2021-04-07 ENCOUNTER — Other Ambulatory Visit: Payer: Self-pay | Admitting: Gastroenterology

## 2021-05-06 ENCOUNTER — Encounter: Payer: Self-pay | Admitting: Oncology

## 2021-05-06 ENCOUNTER — Other Ambulatory Visit: Payer: Self-pay | Admitting: Gastroenterology

## 2021-05-06 DIAGNOSIS — R197 Diarrhea, unspecified: Secondary | ICD-10-CM

## 2021-05-06 MED ORDER — ONDANSETRON HCL 4 MG PO TABS: 4.0000 mg | ORAL_TABLET | Freq: Three times a day (TID) | ORAL | 0 refills | Status: DC | PRN

## 2021-05-07 NOTE — Telephone Encounter (Signed)
Patient has appt in Sept. Please move appts up per pt availability. Labs, then NP/ venofer 1 week after labs.

## 2021-05-08 ENCOUNTER — Ambulatory Visit: Payer: BLUE CROSS/BLUE SHIELD | Admitting: Neurology

## 2021-05-08 NOTE — Telephone Encounter (Signed)
Ok to make app for when you get back or do you want her to see someone sooner?

## 2021-05-08 NOTE — Telephone Encounter (Signed)
This will need to be an office visit. If she's fine with waiting then I'll see her when I get back, if not then she can be scheduled with another provider.

## 2021-05-09 ENCOUNTER — Other Ambulatory Visit: Payer: Self-pay

## 2021-05-09 ENCOUNTER — Inpatient Hospital Stay: Payer: BLUE CROSS/BLUE SHIELD | Attending: Oncology

## 2021-05-09 DIAGNOSIS — D500 Iron deficiency anemia secondary to blood loss (chronic): Secondary | ICD-10-CM | POA: Diagnosis present

## 2021-05-09 DIAGNOSIS — Z9884 Bariatric surgery status: Secondary | ICD-10-CM | POA: Insufficient documentation

## 2021-05-09 LAB — COMPREHENSIVE METABOLIC PANEL
ALT: 30 U/L (ref 0–44)
AST: 26 U/L (ref 15–41)
Albumin: 3.9 g/dL (ref 3.5–5.0)
Alkaline Phosphatase: 74 U/L (ref 38–126)
Anion gap: 7 (ref 5–15)
BUN: 8 mg/dL (ref 6–20)
CO2: 27 mmol/L (ref 22–32)
Calcium: 8.9 mg/dL (ref 8.9–10.3)
Chloride: 102 mmol/L (ref 98–111)
Creatinine, Ser: 0.7 mg/dL (ref 0.44–1.00)
GFR, Estimated: >60 mL/min (ref >60)
Glucose, Bld: 93 mg/dL (ref 70–99)
Potassium: 3.6 mmol/L (ref 3.5–5.1)
Sodium: 136 mmol/L (ref 135–145)
Total Bilirubin: 0.8 mg/dL (ref 0.3–1.2)
Total Protein: 7.1 g/dL (ref 6.5–8.1)

## 2021-05-09 LAB — CBC WITH DIFFERENTIAL/PLATELET
Abs Immature Granulocytes: 0.01 10*3/uL (ref 0.00–0.07)
Basophils Absolute: 0 10*3/uL (ref 0.0–0.1)
Basophils Relative: 1 %
Eosinophils Absolute: 0.1 10*3/uL (ref 0.0–0.5)
Eosinophils Relative: 1 %
HCT: 37.4 % (ref 36.0–46.0)
Hemoglobin: 12.6 g/dL (ref 12.0–15.0)
Immature Granulocytes: 0 %
Lymphocytes Relative: 36 %
Lymphs Abs: 2.1 10*3/uL (ref 0.7–4.0)
MCH: 30.1 pg (ref 26.0–34.0)
MCHC: 33.7 g/dL (ref 30.0–36.0)
MCV: 89.3 fL (ref 80.0–100.0)
Monocytes Absolute: 0.4 10*3/uL (ref 0.1–1.0)
Monocytes Relative: 6 %
Neutro Abs: 3.3 10*3/uL (ref 1.7–7.7)
Neutrophils Relative %: 56 %
Platelets: 308 10*3/uL (ref 150–400)
RBC: 4.19 MIL/uL (ref 3.87–5.11)
RDW: 15.8 % — ABNORMAL HIGH (ref 11.5–15.5)
WBC: 6 10*3/uL (ref 4.0–10.5)
nRBC: 0 % (ref 0.0–0.2)

## 2021-05-09 LAB — IRON AND TIBC
Iron: 114 ug/dL (ref 28–170)
Saturation Ratios: 34 % — ABNORMAL HIGH (ref 10.4–31.8)
TIBC: 333 ug/dL (ref 250–450)
UIBC: 219 ug/dL

## 2021-05-09 LAB — GI PROFILE, STOOL, PCR
Adenovirus F 40/41: NOT DETECTED
Astrovirus: NOT DETECTED
C difficile toxin A/B: NOT DETECTED
Campylobacter: NOT DETECTED
Cryptosporidium: NOT DETECTED
Cyclospora cayetanensis: DETECTED — AB
Entamoeba histolytica: NOT DETECTED
Enteroaggregative E coli: NOT DETECTED
Enteropathogenic E coli: NOT DETECTED
Enterotoxigenic E coli: NOT DETECTED
Giardia lamblia: NOT DETECTED
Norovirus GI/GII: NOT DETECTED
Plesiomonas shigelloides: NOT DETECTED
Rotavirus A: NOT DETECTED
Salmonella: NOT DETECTED
Sapovirus: NOT DETECTED
Shiga-toxin-producing E coli: NOT DETECTED
Shigella/Enteroinvasive E coli: NOT DETECTED
Vibrio cholerae: NOT DETECTED
Vibrio: NOT DETECTED
Yersinia enterocolitica: NOT DETECTED

## 2021-05-09 LAB — VITAMIN B12: Vitamin B-12: 290 pg/mL (ref 180–914)

## 2021-05-09 LAB — FERRITIN: Ferritin: 35 ng/mL (ref 11–307)

## 2021-05-09 LAB — FOLATE: Folate: >100 ng/mL (ref >5.9)

## 2021-05-09 LAB — VITAMIN D 25 HYDROXY (VIT D DEFICIENCY, FRACTURES): Vit D, 25-Hydroxy: 24.83 ng/mL — ABNORMAL LOW (ref 30–100)

## 2021-05-09 NOTE — Telephone Encounter (Signed)
Called patient informed that she will need to be seen in the office. She wanted to wait until Anda Kraft was available. Have made appointment for her to come in once she returns. If she changes her mind and would like to be seen sooner she will call our office.

## 2021-05-10 ENCOUNTER — Encounter: Payer: Self-pay | Admitting: Oncology

## 2021-05-10 ENCOUNTER — Other Ambulatory Visit: Payer: Self-pay | Admitting: Gastroenterology

## 2021-05-10 MED ORDER — SULFAMETHOXAZOLE-TRIMETHOPRIM 800-160 MG PO TABS: 1.0000 | ORAL_TABLET | Freq: Two times a day (BID) | ORAL | 0 refills | Status: AC

## 2021-05-10 NOTE — Telephone Encounter (Signed)
Pt sent Mychart message earlier this week wanting to move up appt for iron check. She had labs done yesterday and now is wondering if she need to come to MD/ Venofer appt? Please advise

## 2021-05-12 LAB — COPPER, SERUM: Copper: 117 ug/dL (ref 80–158)

## 2021-05-16 ENCOUNTER — Ambulatory Visit: Payer: BLUE CROSS/BLUE SHIELD

## 2021-05-16 ENCOUNTER — Ambulatory Visit: Payer: BLUE CROSS/BLUE SHIELD | Admitting: Oncology

## 2021-05-20 ENCOUNTER — Inpatient Hospital Stay: Payer: BLUE CROSS/BLUE SHIELD

## 2021-05-20 ENCOUNTER — Inpatient Hospital Stay: Payer: BLUE CROSS/BLUE SHIELD | Admitting: Oncology

## 2021-05-28 ENCOUNTER — Ambulatory Visit (INDEPENDENT_AMBULATORY_CARE_PROVIDER_SITE_OTHER): Payer: BLUE CROSS/BLUE SHIELD | Admitting: Primary Care

## 2021-05-28 ENCOUNTER — Other Ambulatory Visit: Payer: Self-pay

## 2021-05-28 ENCOUNTER — Encounter: Payer: Self-pay | Admitting: Primary Care

## 2021-05-28 DIAGNOSIS — G43009 Migraine without aura, not intractable, without status migrainosus: Secondary | ICD-10-CM | POA: Diagnosis not present

## 2021-05-28 DIAGNOSIS — D509 Iron deficiency anemia, unspecified: Secondary | ICD-10-CM

## 2021-05-28 DIAGNOSIS — F411 Generalized anxiety disorder: Secondary | ICD-10-CM | POA: Insufficient documentation

## 2021-05-28 MED ORDER — SERTRALINE HCL 50 MG PO TABS: 50.0000 mg | ORAL_TABLET | Freq: Every day | ORAL | 1 refills | Status: DC

## 2021-05-28 MED ORDER — CYCLOBENZAPRINE HCL 5 MG PO TABS
ORAL_TABLET | ORAL | 0 refills | Status: DC
Start: 2021-05-28 — End: 2022-11-27

## 2021-05-28 MED ORDER — TOPIRAMATE 25 MG PO TABS: 12.5000 mg | ORAL_TABLET | Freq: Every day | ORAL | 0 refills | Status: DC

## 2021-05-28 NOTE — Assessment & Plan Note (Signed)
Following with hematology, recent labs reviewed per hematology. Improving.

## 2021-05-28 NOTE — Assessment & Plan Note (Signed)
No longer on propranolol ER 80 mg, not effective anyway.   Agree to supply Topamax 25 mg to take HS for migraine prevention and cyclobenzaprine 5 mg to take PRN until she can get in with neurology.   Continue sumatriptan injections PRN.

## 2021-05-28 NOTE — Patient Instructions (Signed)
Start sertraline (Zoloft) 50 mg for anxiety and depression. Take 1/2 tablet by mouth once daily for about one week, then increase to 1 full tablet thereafter.   Please contact me if you experience any problems when starting this medication or with the dose increase to 1 full tablet. Common side effects should abate within 1-2 weeks.   You may resume Topamax 25 mg at bedtime for migraine prevention as discussed.  You may take cyclobenzaprine 5 mg as needed for migraines. This may cause drowsiness.   It was a pleasure to see you today!

## 2021-05-28 NOTE — Assessment & Plan Note (Signed)
Chronic for years, increased since early 2022.  Continue meeting with therapy. Adding Zoloft 50 mg daily. Patient is to take 1/2 tablet daily for 8 days, then advance to 1 full tablet thereafter. We discussed possible side effects of headache, GI upset, drowsiness, and SI/HI. If thoughts of SI/HI develop, we discussed to present to the emergency immediately. Patient verbalized understanding.   She will update in 6 weeks via MyChart.

## 2021-05-28 NOTE — Progress Notes (Signed)
Subjective:    Patient ID: Jean Pitts, female    DOB: [birth year], 35 y.o.   MRN: [id number]  HPI  Jean Pitts is a very pleasant 35 y.o. female with a history of roux-en-Y-gastric bypass, gastrojejunal ulcer with GI bleed, symptomatic iron deficiency anemia, dizziness  who presents today to discuss headaches and anxiety.  Following with GI and hematology for chronic GI bleeding causing iron deficiency anemia. She underwent capsule study per GI in late May which revealed no bleeding. Her anemia was suspected to be secondary to anastomic ulcer and gastric bypass anatomy. She send a MyChart message on 05/07/21 requesting additional labs including vitamin and CBC. She was asked to come in for an update.  Today she continues to feel stressed with school, her health, and work. She is still working from home mostly, will be going back two days weekly in the office starting in September and three days weekly starting in October. She's recently contacted a counselor due to ongoing stress, has had two sessions thus far. Her hair has been falling out, she's still feeling fatigued, stress eating, difficulty concentrating, poor short term memory.   Following with neurology for chronic migraines. No longer on propranolol ER 80 mg, discontinued in May due to concerns for low BP. She doesn't believe this was effective anyway. She has historically done well on Topamax, up to doses of 200 mg, but causes extreme drowsiness. Headaches are occurring nearly daily, migraines occurring 1-2 times monthly. Is taking Tylenol 2000 mg several times weekly. She is using sumatriptan injections for migraines with resolve.   She is missing her vitamin D 50,000 weekly often. She is not taking B12. She underwent labs recently on 05/09/21.  Review of Systems  Constitutional:  Positive for fatigue.  Respiratory:  Negative for shortness of breath.   Cardiovascular:  Negative for chest pain.  Neurological:  Positive for  headaches.  Psychiatric/Behavioral:  Positive for decreased concentration. The patient is nervous/anxious.        See HPI        Past Medical History:  Diagnosis Date   Abnormal glucose tolerance test in pregnancy, antepartum 06/04/2012   Formatting of this note might be different from the original. Needs 3h GTT   B12 deficiency    Family history of adverse reaction to anesthesia    MATERNAL AUNT-PT UNAWARE OF WHAT HAPPENED WITH ANESTHESIA BUT STATES HER AUNT HAS HAD PROBLEMS IN PAST    Fecal occult blood test positive    Genital herpes    GERD (gastroesophageal reflux disease)    H/O cesarean section complicating pregnancy 99991111   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/19/2021   Hair thinning 05/12/2018   Headache    MIGRAINE   Heartburn 10/26/2018   Hyperlipidemia    Hypertension    Migraine    Obesity    PONV (postoperative nausea and vomiting)    Syncope 04-2015   Vitamin D deficiency    Yeast vaginitis 02/23/2019   Candida albicans on culture    Social History   Socioeconomic History   Marital status: Married    Spouse name: Not on file   Number of children: 2   Years of education: Not on file   Highest education level: Some college, no degree  Occupational History   Occupation: Multimedia programmer   Tobacco Use   Smoking status: Never   Smokeless tobacco: Never  Vaping Use   Vaping Use: Never used  Substance and Sexual Activity   Alcohol use:  Not Currently    Comment: RARE   Drug use: No   Sexual activity: Yes    Partners: Male    Birth control/protection: Surgical    Comment: Ablation  Other Topics Concern   Not on file  Social History Narrative      She also goes to school full time at [employer]   Social Determinants of Health   Financial Resource Strain: Not on file  Food Insecurity: Not on file  Transportation Needs: Not on file  Physical Activity: Not on file  Stress: Not on file  Social Connections: Not on file  Intimate Partner Violence: Not on file     Past Surgical History:  Procedure Laterality Date   APPENDECTOMY     BUNIONECTOMY     CESAREAN SECTION     X2   COLONOSCOPY WITH PROPOFOL N/A 12/31/2020   Procedure: COLONOSCOPY WITH PROPOFOL. eleview and spot ink tattoo used.;  Surgeon: Virgel Manifold, MD;  Location: [place];  Service: Endoscopy;  Laterality: N/A;   DIAGNOSTIC LAPAROSCOPY     ENDOMETRIAL BIOPSY     ENTEROSCOPY N/A 01/23/2021   Procedure: ENTEROSCOPY;  Surgeon: Virgel Manifold, MD;  Location: [facility];  Service: Endoscopy;  Laterality: N/A;   ENTEROSCOPY N/A 02/04/2021   Procedure: ENTEROSCOPY;  Surgeon: Ladene Artist, MD;  Location: [facility];  Service: Gastroenterology;  Laterality: N/A;   ESOPHAGOGASTRODUODENOSCOPY (EGD) WITH PROPOFOL N/A 12/31/2020   Procedure: ESOPHAGOGASTRODUODENOSCOPY (EGD) WITH PROPOFOL;  Surgeon: Virgel Manifold, MD;  Location: [place];  Service: Endoscopy;  Laterality: N/A;   FOREIGN BODY REMOVAL  02/04/2021   Procedure: FOREIGN BODY REMOVAL;  Surgeon: Ladene Artist, MD;  Location: [facility];  Service: Gastroenterology;;  staples from other gastric procedure   GASTRIC BYPASS     GIVENS CAPSULE STUDY N/A 02/19/2021   Procedure: GIVENS CAPSULE STUDY;  Surgeon: Virgel Manifold, MD;  Location: [facility];  Service: Endoscopy;  Laterality: N/A;   LASIK     MOUTH SURGERY     Tongue Cyst   NOVASURE ABLATION N/A 06/29/2015   Procedure: DILATION AND CURETTAGE, HYSTEROSCOPY, NOVASURE ABLATION;  Surgeon: Malachy Mood, MD;  Location: [facility];  Service: Gynecology;  Laterality: N/A;   TUBAL LIGATION     WISDOM TOOTH EXTRACTION      Family History  Problem Relation Age of Onset   Obesity Mother    Hyperlipidemia Mother    Obesity Sister    Obesity Brother    Obesity Sister    Ovarian cancer Maternal Grandmother 1       Cervical/Sqammacell    Allergies  Allergen Reactions   Augmentin [Amoxicillin-Pot Clavulanate]     diarrhea    Codeine Other (See Comments)    Other reaction(s): Other (See Comments)   Hydrocodone-Acetaminophen Nausea And Vomiting    Current Outpatient Medications on File Prior to Visit  Medication Sig Dispense Refill   fluconazole (DIFLUCAN) 150 MG tablet Take 1 tablet (150 mg total) by mouth once a week. As preventive 12 tablet 0   SUMAtriptan Succinate Refill 6 MG/0.5ML SOCT Inject 0.5 mLs into the skin daily as needed (migraines).     Vitamin D, Ergocalciferol, (DRISDOL) 1.25 MG (50000 UNIT) CAPS capsule Take 1 capsule (50,000 Units total) by mouth once a week. 12 capsule 0   esomeprazole (NEXIUM) 40 MG capsule TAKE 1 CAPSULE BY MOUTH TWICE A DAY (Patient not taking: Reported on 05/28/2021) 120 capsule 0   ferrous sulfate 325 (65 FE) MG  tablet Take 1 tablet (325 mg total) by mouth every other day. (Patient not taking: Reported on 05/28/2021)  3   ondansetron (ZOFRAN) 4 MG tablet Take 1 tablet (4 mg total) by mouth every 8 (eight) hours as needed for up to 20 doses for nausea or vomiting. (Patient not taking: Reported on 05/28/2021) 20 tablet 0   propranolol ER (INDERAL LA) 80 MG 24 hr capsule TAKE 1 CAPSULE (80 MG TOTAL) BY MOUTH DAILY. FOR HEADACHE PREVENTION. (Patient not taking: No sig reported) 90 capsule 0   No current facility-administered medications on file prior to visit.    BP 124/78   Pulse 88   Temp 98.7 F (37.1 C) (Temporal)   Ht '5\' 4"'$  (1.626 m)   Wt 240 lb (108.9 kg)   SpO2 96%   BMI 41.20 kg/m  Objective:   Physical Exam Cardiovascular:     Rate and Rhythm: Normal rate and regular rhythm.  Pulmonary:     Effort: Pulmonary effort is normal.     Breath sounds: Normal breath sounds.  Musculoskeletal:     Cervical back: Neck supple.  Skin:    General: Skin is warm and dry.  Psychiatric:        Mood and Affect: Mood normal.          Assessment & Plan:      This visit occurred during the SARS-CoV-2 public health emergency.  Safety protocols were in place,  including screening questions prior to the visit, additional usage of staff PPE, and extensive cleaning of exam room while observing appropriate contact time as indicated for disinfecting solutions.

## 2021-06-02 DIAGNOSIS — G43009 Migraine without aura, not intractable, without status migrainosus: Secondary | ICD-10-CM

## 2021-06-04 ENCOUNTER — Ambulatory Visit: Payer: BLUE CROSS/BLUE SHIELD

## 2021-06-04 ENCOUNTER — Other Ambulatory Visit: Payer: BLUE CROSS/BLUE SHIELD

## 2021-06-04 ENCOUNTER — Ambulatory Visit: Payer: BLUE CROSS/BLUE SHIELD | Admitting: Oncology

## 2021-06-04 MED ORDER — SUMATRIPTAN SUCCINATE REFILL 6 MG/0.5ML ~~LOC~~ SOCT: SUBCUTANEOUS | 0 refills | Status: DC

## 2021-06-06 ENCOUNTER — Other Ambulatory Visit: Payer: BLUE CROSS/BLUE SHIELD

## 2021-06-13 ENCOUNTER — Ambulatory Visit: Payer: BLUE CROSS/BLUE SHIELD

## 2021-06-13 ENCOUNTER — Ambulatory Visit: Payer: BLUE CROSS/BLUE SHIELD | Admitting: Nurse Practitioner

## 2021-06-19 ENCOUNTER — Other Ambulatory Visit: Payer: Self-pay | Admitting: Primary Care

## 2021-06-19 DIAGNOSIS — F411 Generalized anxiety disorder: Secondary | ICD-10-CM

## 2021-06-24 ENCOUNTER — Encounter: Payer: Self-pay | Admitting: Neurology

## 2021-06-24 ENCOUNTER — Ambulatory Visit (INDEPENDENT_AMBULATORY_CARE_PROVIDER_SITE_OTHER): Payer: BLUE CROSS/BLUE SHIELD | Admitting: Neurology

## 2021-06-24 VITALS — BP 129/80 | HR 78 | Ht 64.0 in | Wt 245.4 lb

## 2021-06-24 DIAGNOSIS — G43709 Chronic migraine without aura, not intractable, without status migrainosus: Secondary | ICD-10-CM | POA: Diagnosis not present

## 2021-06-24 DIAGNOSIS — R519 Headache, unspecified: Secondary | ICD-10-CM | POA: Diagnosis not present

## 2021-06-24 DIAGNOSIS — R51 Headache with orthostatic component, not elsewhere classified: Secondary | ICD-10-CM

## 2021-06-24 DIAGNOSIS — H539 Unspecified visual disturbance: Secondary | ICD-10-CM

## 2021-06-24 MED ORDER — AJOVY 225 MG/1.5ML ~~LOC~~ SOAJ
225.0000 mg | SUBCUTANEOUS | 11 refills | Status: DC
Start: 2021-06-24 — End: 2022-09-15

## 2021-06-24 NOTE — Patient Instructions (Addendum)
MRi of the bran w/wo contrast Start Ajovy for prevention Acute/emergent management: continue sumatriptan we may modify in the future  Fremanezumab injection What is this medication? FREMANEZUMAB (fre ma NEZ ue mab) is used to prevent migraine headaches. This medicine may be used for other purposes; ask your health care provider or pharmacist if you have questions. COMMON BRAND NAME(S): AJOVY What should I tell my care team before I take this medication? They need to know if you have any of these conditions: an unusual or allergic reaction to fremanezumab, other medicines, foods, dyes, or preservatives pregnant or trying to get pregnant breast-feeding How should I use this medication? This medicine is for injection under the skin. You will be taught how to prepare and give this medicine. Use exactly as directed. Take your medicine at regular intervals. Do not take your medicine more often than directed. It is important that you put your used needles and syringes in a special sharps container. Do not put them in a trash can. If you do not have a sharps container, call your pharmacist or healthcare provider to get one. Talk to your pediatrician regarding the use of this medicine in children. Special care may be needed. Overdosage: If you think you have taken too much of this medicine contact a poison control center or emergency room at once. NOTE: This medicine is only for you. Do not share this medicine with others. What if I miss a dose? If you miss a dose, take it as soon as you can. If it is almost time for your next dose, take only that dose. Do not take double or extra doses. What may interact with this medication? Interactions are not expected. This list may not describe all possible interactions. Give your health care provider a list of all the medicines, herbs, non-prescription drugs, or dietary supplements you use. Also tell them if you smoke, drink alcohol, or use illegal drugs. Some  items may interact with your medicine. What should I watch for while using this medication? Tell your doctor or healthcare professional if your symptoms do not start to get better or if they get worse. What side effects may I notice from receiving this medication? Side effects that you should report to your doctor or health care professional as soon as possible: allergic reactions like skin rash, itching or hives, swelling of the face, lips, or tongue Side effects that usually do not require medical attention (report these to your doctor or health care professional if they continue or are bothersome): pain, redness, or irritation at site where injected This list may not describe all possible side effects. Call your doctor for medical advice about side effects. You may report side effects to FDA at 1-800-FDA-1088. Where should I keep my medication? Keep out of the reach of children. You will be instructed on how to store this medicine. Throw away any unused medicine after the expiration date on the label. NOTE: This sheet is a summary. It may not cover all possible information. If you have questions about this medicine, talk to your doctor, pharmacist, or health care provider.  2022 Elsevier/Gold Standard (2017-06-15 17:22:56)

## 2021-06-24 NOTE — Progress Notes (Signed)
[facility]    Provider:  Dr Jaynee Eagles Requesting Provider: Pleas Koch, NP Primary Care Provider:  Pleas Koch, NP  CC:  headaches  HPI:  Jean Pitts is a 35 y.o. female here as requested by Pleas Koch, NP for headaches.  Past medical history migraines, hyperlipidemia, hypertension, obesity, syncope.  I reviewed Veatrice Kells notes: Patient has ongoing dizziness and fatigue, dizziness does not occur if sitting or remaining still, she has to have supervision shower, she is currently managed on propranolol extended release 80 mg for migraines, she is also tried Topamax which causes drowsiness and amitriptyline which was not effective, she is experiencing headaches near daily.  She has had headache since she can remember, mother with migraines, always throbbing, worse over the years, 3 years ago she had the most severe, she had to go to the ED and has been multiple times since; the migraines are unilateral, pulsating/pounding, stiffness in the neck, nausea, photophobia/phonophobia, they "put me down", a dark room helps, stress and sleeping can affect it, hasn't noticed weather affecting her. Daily headaches. Mod to severe migraine at least 1/2 the month 15 days, can last 24-72 hours at least, sometimes OTC meds help and the imitrex but doesn't completely take it away sometimes she gets it under the control but it is still there. No aura. She had dizziness but she has been having a lot dizziness recently, unclear if associated with migraines or not. She has been up to 200mg  of topamax and she is a "zombie" cognitive issues, amitriptylone also gave side effects, propranolol gave her dizziness and heart rate and BP issues. She does not snore, wakes with headache, blurry vision when the headaches are bad, eyes hurt and so sensitivie, morning headaches can be the worse when supine. No other focal neurologic deficits, associated symptoms, inciting events or modifiable  factors.   Reviewed notes, labs and imaging from outside physicians, which showed :  Tried: amitriptyline, propranolol, atenolol, topamax, flexeril(all side effects)  CT head 05/2006 showed No acute intracranial abnormalities including mass lesion or mass effect, hydrocephalus, extra-axial fluid collection, midline shift, hemorrhage, or acute infarction, large ischemic events (personally reviewed images)  Cbc/cmp unremarkable 04/2021 01/18/2021: tsh nml 1.29  Review of Systems: Patient complains of symptoms per HPI as well as the following symptoms headache. Pertinent negatives and positives per HPI. All others negative.   Social History   Socioeconomic History   Marital status: Married    Spouse name: Not on file   Number of children: 2   Years of education: Not on file   Highest education level: Some college, no degree  Occupational History   Occupation: Multimedia programmer   Tobacco Use   Smoking status: Never   Smokeless tobacco: Never  Vaping Use   Vaping Use: Never used  Substance and Sexual Activity   Alcohol use: Not Currently    Comment: RARE   Drug use: No   Sexual activity: Yes    Partners: Male    Birth control/protection: Surgical    Comment: Ablation  Other Topics Concern   Not on file  Social History Narrative      She also goes to school full time at [employer] Strain: Not on file  Food Insecurity: Not on file  Transportation Needs: Not on file  Physical Activity: Not on file  Stress: Not on file  Social Connections: Not on file  Intimate Partner Violence: Not  on file    Family History  Problem Relation Age of Onset   Hemangiomas Mother        benign osseous 2022 MRI   Migraines Mother    Obesity Mother    Hyperlipidemia Mother    Obesity Sister    Obesity Sister    Obesity Brother    Neuropathy Maternal Uncle    Ovarian cancer Maternal Grandmother 44       Cervical/Sqammacell     Past Medical History:  Diagnosis Date   Abnormal glucose tolerance test in pregnancy, antepartum 06/04/2012   Formatting of this note might be different from the original. 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   Hair thinning 05/12/2018   Headache    MIGRAINE   Heartburn 10/26/2018   Hyperlipidemia    Hypertension    Migraine    Obesity    PONV (postoperative nausea and vomiting)    Syncope 04-2015   Vitamin D deficiency    Yeast vaginitis 02/23/2019   Candida albicans on culture    Patient Active Problem List   Diagnosis Date Noted   GAD (generalized anxiety disorder) 05/28/2021   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/19/2021   IDA (iron deficiency anemia) 02/19/2021   Foreign body in intestine    History of Roux-en-Y gastric bypass    Symptomatic anemia 01/31/2021   Orthostatic intolerance 01/31/2021   Preventative health care 01/17/2021   Dizziness 01/17/2021   Melena    Marginal ulcer    Polyp of ascending colon    Black tarry stools 12/20/2020   Generalized abdominal pain 12/20/2020   Bunion of right foot 11/02/2018   Hyperlipidemia 10/26/2018   Morbid obesity ([place]) 06/04/2016   PCOS (polycystic ovarian syndrome) 06/04/2016   S/P bariatric surgery 05/25/2013   Absolute anemia 10/22/2012   Chronic migraine without aura without status migrainosus, not intractable 12/04/2011    Past Surgical History:  Procedure Laterality Date   APPENDECTOMY     BUNIONECTOMY     CESAREAN SECTION     X2   COLONOSCOPY WITH PROPOFOL N/A 12/31/2020   Procedure: COLONOSCOPY WITH PROPOFOL. eleview and spot ink tattoo used.;  Surgeon: Virgel Manifold, MD;   Location: [place];  Service: Endoscopy;  Laterality: N/A;   DIAGNOSTIC LAPAROSCOPY     ENDOMETRIAL BIOPSY     ENTEROSCOPY N/A 01/23/2021   Procedure: ENTEROSCOPY;  Surgeon: Virgel Manifold, MD;  Location: [facility];  Service: Endoscopy;  Laterality: N/A;   ENTEROSCOPY N/A 02/04/2021   Procedure: ENTEROSCOPY;  Surgeon: Ladene Artist, MD;  Location: [facility];  Service: Gastroenterology;  Laterality: N/A;   ESOPHAGOGASTRODUODENOSCOPY (EGD) WITH PROPOFOL N/A 12/31/2020   Procedure: ESOPHAGOGASTRODUODENOSCOPY (EGD) WITH PROPOFOL;  Surgeon: Virgel Manifold, MD;  Location: [place];  Service: Endoscopy;  Laterality: N/A;   FOREIGN BODY REMOVAL  02/04/2021   Procedure: FOREIGN BODY REMOVAL;  Surgeon: Ladene Artist, MD;  Location: [facility];  Service: Gastroenterology;;  staples from other gastric procedure   GASTRIC BYPASS     GIVENS CAPSULE STUDY N/A 02/19/2021   Procedure: GIVENS CAPSULE STUDY;  Surgeon: [name]  B, MD;  Location: [facility];  Service: Endoscopy;  Laterality: N/A;   LASIK     MOUTH SURGERY     Tongue Cyst   NOVASURE ABLATION N/A 06/29/2015   Procedure: DILATION AND CURETTAGE, HYSTEROSCOPY, NOVASURE ABLATION;  Surgeon: Malachy Mood, MD;  Location: [facility];  Service: Gynecology;  Laterality: N/A;   TUBAL LIGATION     WISDOM TOOTH EXTRACTION      Current Outpatient Medications  Medication Sig Dispense Refill   cyclobenzaprine (FLEXERIL) 5 MG tablet Take 1 tablet by mouth once daily as needed for migraines. 30 tablet 0   esomeprazole (NEXIUM) 40 MG capsule TAKE 1 CAPSULE BY MOUTH TWICE A DAY 120 capsule 0   ferrous sulfate 325 (65 FE) MG tablet Take 1 tablet (325 mg total) by mouth every other day.  3   fluconazole (DIFLUCAN) 150 MG tablet Take 1 tablet (150 mg total) by mouth once a week. As preventive 12 tablet 0   Fremanezumab-vfrm (AJOVY) 225 MG/1.5ML SOAJ Inject 225 mg into the skin every 30 (thirty) days. 1.5 mL 11    ondansetron (ZOFRAN) 4 MG tablet Take 1 tablet (4 mg total) by mouth every 8 (eight) hours as needed for up to 20 doses for nausea or vomiting. 20 tablet 0   sertraline (ZOLOFT) 50 MG tablet Take 1 tablet (50 mg total) by mouth daily. For anxiety. 30 tablet 1   SUMAtriptan Succinate Refill 6 MG/0.5ML SOCT Inject 6 mg into the skin at migraine onset, may repeat with 6 mg 2 hours later if migraine persists. 15 mL 0   topiramate (TOPAMAX) 25 MG tablet Take 0.5-1 tablets (12.5-25 mg total) by mouth at bedtime. For headache prevention. 90 tablet 0   Vitamin D, Ergocalciferol, (DRISDOL) 1.25 MG (50000 UNIT) CAPS capsule Take 1 capsule (50,000 Units total) by mouth once a week. 12 capsule 0   No current facility-administered medications for this visit.    Allergies as of 06/24/2021 - Review Complete 06/24/2021  Allergen Reaction Noted   Augmentin [amoxicillin-pot clavulanate]  03/06/2021   Codeine Other (See Comments) 04/12/2014   Hydrocodone-acetaminophen Nausea And Vomiting 06/26/2015    Vitals: BP 129/80   Pulse 78   Ht 5\' 4"  (1.626 m)   Wt 245 lb 6.4 oz (111.3 kg)   BMI 42.12 kg/m  Last Weight:  Wt Readings from Last 1 Encounters:  06/24/21 245 lb 6.4 oz (111.3 kg)   Last Height:   Ht Readings from Last 1 Encounters:  06/24/21 5\' 4"  (1.626 m)     Physical exam: Exam: Gen: NAD, conversant, well nourised, obese, well groomed                     CV: RRR, no MRG. No Carotid Bruits. No peripheral edema, warm, nontender Eyes: Conjunctivae clear without exudates or hemorrhage  Neuro: Detailed Neurologic Exam  Speech:    Speech is normal; fluent and spontaneous with normal comprehension.  Cognition:    The patient is oriented to person, place, and time;     recent and remote memory intact;     language fluent;     normal attention, concentration,     fund of knowledge Cranial Nerves:    The pupils are equal, round, and reactive to light. The fundi are flat. Visual fields  are full to finger confrontation. Extraocular movements are intact. Trigeminal sensation is intact and the muscles of mastication are normal. The face is symmetric. The palate elevates in the midline. Hearing  intact. Voice is normal. Shoulder shrug is normal. The tongue has normal motion without fasciculations.   Coordination:    Normal    Gait:  normal.   Motor Observation:    No asymmetry, no atrophy, and no involuntary movements noted. Tone:    Normal muscle tone.    Posture:    Posture is normal. normal erect    Strength:    Strength is V/V in the upper and lower limbs.      Sensation: intact to LT     Reflex Exam:  DTR's:    Deep tendon reflexes in the upper and lower extremities are symmetrical bilaterally.   Toes:    The toes are downgoing bilaterally.   Clonus:    Clonus is absent.    Assessment/Plan: This is a 35 year old patient with chronic migraines.  However given some concerning symptoms I think she should have an MRI of the brain.  We had a long discussion about migraine management, lifestyle, preventative and acute management, she has failed multiple classes, at this time we will start Ajovy for prevention, she does well with that acute management sumatriptan injections we can modify that later if needed.  Orders Placed This Encounter  Procedures   MR BRAIN W WO CONTRAST    Meds ordered this encounter  Medications   Fremanezumab-vfrm (AJOVY) 225 MG/1.5ML SOAJ    Sig: Inject 225 mg into the skin every 30 (thirty) days.    Dispense:  1.5 mL    Refill:  11    Patient has copay card; she can have medication regardless of insurance approval or copay amount.     Cc: Pleas Koch, NP,  Pleas Koch, NP  Sarina Ill, MD  [facility] [address] [place], [place] [address]  Phone [phone] Fax [phone]

## 2021-06-30 DIAGNOSIS — F411 Generalized anxiety disorder: Secondary | ICD-10-CM

## 2021-07-03 ENCOUNTER — Telehealth: Payer: Self-pay | Admitting: Neurology

## 2021-07-03 NOTE — Telephone Encounter (Signed)
MR Brain w/wo contrast Dr. Ihor Dow Josem Kaufmann: [place] via bcbs website. Patient is scheduled at [facility] for 07/17/21.

## 2021-07-17 ENCOUNTER — Ambulatory Visit (INDEPENDENT_AMBULATORY_CARE_PROVIDER_SITE_OTHER): Payer: BLUE CROSS/BLUE SHIELD

## 2021-07-17 DIAGNOSIS — R51 Headache with orthostatic component, not elsewhere classified: Secondary | ICD-10-CM

## 2021-07-17 DIAGNOSIS — R519 Headache, unspecified: Secondary | ICD-10-CM

## 2021-07-17 DIAGNOSIS — H539 Unspecified visual disturbance: Secondary | ICD-10-CM | POA: Diagnosis not present

## 2021-07-17 MED ORDER — GADOBENATE DIMEGLUMINE 529 MG/ML IV SOLN
20.0000 mL | Freq: Once | INTRAVENOUS | Status: AC | PRN
  Administered 2021-07-17: 20 mL via INTRAVENOUS

## 2021-07-30 MED ORDER — SERTRALINE HCL 100 MG PO TABS
100.0000 mg | ORAL_TABLET | Freq: Every day | ORAL | 2 refills | Status: DC
Start: 2021-07-30 — End: 2021-10-15

## 2021-07-31 ENCOUNTER — Other Ambulatory Visit: Payer: Self-pay | Admitting: Obstetrics and Gynecology

## 2021-07-31 ENCOUNTER — Ambulatory Visit: Payer: BLUE CROSS/BLUE SHIELD | Admitting: Neurology

## 2021-08-18 ENCOUNTER — Other Ambulatory Visit: Payer: Self-pay | Admitting: Primary Care

## 2021-08-18 DIAGNOSIS — G43009 Migraine without aura, not intractable, without status migrainosus: Secondary | ICD-10-CM

## 2021-09-02 ENCOUNTER — Ambulatory Visit (INDEPENDENT_AMBULATORY_CARE_PROVIDER_SITE_OTHER): Payer: BLUE CROSS/BLUE SHIELD | Admitting: Obstetrics and Gynecology

## 2021-09-02 ENCOUNTER — Encounter: Payer: Self-pay | Admitting: Obstetrics and Gynecology

## 2021-09-02 ENCOUNTER — Other Ambulatory Visit: Payer: Self-pay

## 2021-09-02 VITALS — BP 130/80 | Ht 63.5 in | Wt 255.0 lb

## 2021-09-02 DIAGNOSIS — B3731 Acute candidiasis of vulva and vagina: Secondary | ICD-10-CM

## 2021-09-02 LAB — POCT WET PREP WITH KOH
Clue Cells Wet Prep HPF POC: NEGATIVE
KOH Prep POC: NEGATIVE
Trichomonas, UA: NEGATIVE
Yeast Wet Prep HPF POC: POSITIVE

## 2021-09-02 MED ORDER — FLUCONAZOLE 150 MG PO TABS: 150.0000 mg | ORAL_TABLET | Freq: Once | ORAL | 0 refills | Status: DC

## 2021-09-02 NOTE — Progress Notes (Signed)
Pleas Koch, NP   Chief Complaint  Patient presents with   Vaginal Discharge    Irritation, sour odor, itchiness x 1 week    HPI:      Jean Pitts is a 35 y.o. [id number] whose LMP was No LMP recorded. Patient has had an ablation., presents today for vaginal itching and irritation, increased d/c, no fishy odor. Sx for at least a wk. No meds used to treat. No urin sx. Sx started with yeast infection under panus. Treated with OTC anti-fungal powders and cream with sx improvement. Hx of recurrent yeast vag, needing wkly diflucan in past as preventive. Pt not taking probiotics, wears thongs daily. Uses unscented soap, no dryer sheets.   Patient Active Problem List   Diagnosis Date Noted   GAD (generalized anxiety disorder) 05/28/2021   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/19/2021   IDA (iron deficiency anemia) 02/19/2021   Foreign body in intestine    History of Roux-en-Y gastric bypass    Symptomatic anemia 01/31/2021   Orthostatic intolerance 01/31/2021   Preventative health care 01/17/2021   Dizziness 01/17/2021   Melena    Marginal ulcer    Polyp of ascending colon    Black tarry stools 12/20/2020   Generalized abdominal pain 12/20/2020   Bunion of right foot 11/02/2018   Hyperlipidemia 10/26/2018   Morbid obesity ([place]) 06/04/2016   PCOS (polycystic ovarian syndrome) 06/04/2016   S/P bariatric surgery 05/25/2013   Absolute anemia 10/22/2012   Chronic migraine without aura without status migrainosus, not intractable 12/04/2011    Past Surgical History:  Procedure Laterality Date   APPENDECTOMY     BUNIONECTOMY     CESAREAN SECTION     X2   COLONOSCOPY WITH PROPOFOL N/A 12/31/2020   Procedure: COLONOSCOPY WITH PROPOFOL. eleview and spot ink tattoo used.;  Surgeon: Virgel Manifold, MD;  Location: [place];  Service: Endoscopy;  Laterality: N/A;   DIAGNOSTIC LAPAROSCOPY     ENDOMETRIAL BIOPSY     ENTEROSCOPY N/A 01/23/2021   Procedure: ENTEROSCOPY;   Surgeon: Virgel Manifold, MD;  Location: [facility];  Service: Endoscopy;  Laterality: N/A;   ENTEROSCOPY N/A 02/04/2021   Procedure: ENTEROSCOPY;  Surgeon: Ladene Artist, MD;  [facility];  Service: Gastroenterology;  Laterality: N/A;   ESOPHAGOGASTRODUODENOSCOPY (EGD) WITH PROPOFOL N/A 12/31/2020   Procedure: ESOPHAGOGASTRODUODENOSCOPY (EGD) WITH PROPOFOL;  Surgeon: Virgel Manifold, MD;  Location: [place];  Service: Endoscopy;  Laterality: N/A;   FOREIGN BODY REMOVAL  02/04/2021   Procedure: FOREIGN BODY REMOVAL;  Surgeon: Ladene Artist, MD;  [facility];  Service: Gastroenterology;;  staples from other gastric procedure   GASTRIC BYPASS     GIVENS CAPSULE STUDY N/A 02/19/2021   Procedure: GIVENS CAPSULE STUDY;  Surgeon: Virgel Manifold, MD;  Location: [facility];  Service: Endoscopy;  Laterality: N/A;   LASIK     MOUTH SURGERY     Tongue Cyst   NOVASURE ABLATION N/A 06/29/2015   Procedure: DILATION AND CURETTAGE, HYSTEROSCOPY, NOVASURE ABLATION;  Surgeon: Malachy Mood, MD;  Location: [facility];  Service: Gynecology;  Laterality: N/A;   TUBAL LIGATION     WISDOM TOOTH EXTRACTION      Family History  Problem Relation Age of Onset   Hemangiomas Mother        benign osseous 2022 MRI   Migraines Mother    Obesity Mother    Hyperlipidemia Mother    Obesity Sister    Obesity  Sister    Obesity Brother    Neuropathy Maternal Uncle    Ovarian cancer Maternal Grandmother 41       Cervical/Sqammacell    Social History   Socioeconomic History   Marital status: Married    Spouse name: Not on file   Number of children: 2   Years of education: Not on file   Highest education level: Some college, no degree  Occupational History   Occupation: Multimedia programmer   Tobacco Use   Smoking status: Never   Smokeless tobacco: Never  Vaping Use   Vaping Use: Never used  Substance and Sexual Activity   Alcohol use: Not Currently     Comment: RARE   Drug use: No   Sexual activity: Yes    Partners: Male    Birth control/protection: Surgical    Comment: Ablation  Other Topics Concern   Not on file  Social History Narrative      She also goes to school full time at [employer] Strain: Not on file  Food Insecurity: Not on file  Transportation Needs: Not on file  Physical Activity: Not on file  Stress: Not on file  Social Connections: Not on file  Intimate Partner Violence: Not on file    Outpatient Medications Prior to Visit  Medication Sig Dispense Refill   cyclobenzaprine (FLEXERIL) 5 MG tablet Take 1 tablet by mouth once daily as needed for migraines. 30 tablet 0   Fremanezumab-vfrm (AJOVY) 225 MG/1.5ML SOAJ Inject 225 mg into the skin every 30 (thirty) days. 1.5 mL 11   ondansetron (ZOFRAN) 4 MG tablet Take 1 tablet (4 mg total) by mouth every 8 (eight) hours as needed for up to 20 doses for nausea or vomiting. 20 tablet 0   sertraline (ZOLOFT) 100 MG tablet Take 1 tablet (100 mg total) by mouth daily. For anxiety. 90 tablet 2   SUMAtriptan Succinate Refill 6 MG/0.5ML SOCT Inject 6 mg into the skin at migraine onset, may repeat with 6 mg 2 hours later if migraine persists. 15 mL 0   Vitamin D, Ergocalciferol, (DRISDOL) 1.25 MG (50000 UNIT) CAPS capsule Take 1 capsule (50,000 Units total) by mouth once a week. 12 capsule 0   fluconazole (DIFLUCAN) 150 MG tablet Take 1 tablet (150 mg total) by mouth once a week. As preventive 12 tablet 0   esomeprazole (NEXIUM) 40 MG capsule TAKE 1 CAPSULE BY MOUTH TWICE A DAY 120 capsule 0   ferrous sulfate 325 (65 FE) MG tablet Take 1 tablet (325 mg total) by mouth every other day.  3   topiramate (TOPAMAX) 25 MG tablet Take 0.5-1 tablets (12.5-25 mg total) by mouth at bedtime. For headache prevention. 90 tablet 0   No facility-administered medications prior to visit.      ROS:  Review of Systems  Constitutional:   Negative for fever.  Gastrointestinal:  Negative for blood in stool, constipation, diarrhea, nausea and vomiting.  Genitourinary:  Positive for vaginal discharge. Negative for dyspareunia, dysuria, flank pain, frequency, hematuria, urgency, vaginal bleeding and vaginal pain.  Musculoskeletal:  Negative for back pain.  Skin:  Negative for rash.  BREAST: No symptoms   OBJECTIVE:   Vitals:  BP 130/80   Ht 5' 3.5" (1.613 m)   Wt 255 lb (115.7 kg)   BMI 44.46 kg/m   Physical Exam Vitals reviewed.  Constitutional:      Appearance: She is well-developed.  Pulmonary:  Effort: Pulmonary effort is normal.  Genitourinary:    Pubic Area: No rash.      Labia:        Right: No rash, tenderness or lesion.        Left: No rash, tenderness or lesion.      Vagina: Normal. No vaginal discharge, erythema or tenderness.     Cervix: Normal.     Uterus: Normal. Not enlarged and not tender.      Adnexa: Right adnexa normal and left adnexa normal.       Right: No mass or tenderness.         Left: No mass or tenderness.       Comments: BILAT LABIA MINORA AND MAJORA WITH ERYTHEMA Musculoskeletal:        General: Normal range of motion.     Cervical back: Normal range of motion.  Skin:    General: Skin is warm and dry.  Neurological:     General: No focal deficit present.     Mental Status: She is alert and oriented to person, place, and time.  Psychiatric:        Mood and Affect: Mood normal.        Behavior: Behavior normal.        Thought Content: Thought content normal.        Judgment: Judgment normal.    Results: Results for orders placed or performed in visit on 09/02/21 (from the past 24 hour(s))  POCT Wet Prep with KOH     Status: Abnormal   Collection Time: 09/02/21 10:32 AM  Result Value Ref Range   Trichomonas, UA Negative    Clue Cells Wet Prep HPF POC neg    Epithelial Wet Prep HPF POC     Yeast Wet Prep HPF POC pos    Bacteria Wet Prep HPF POC     RBC Wet Prep HPF  POC     WBC Wet Prep HPF POC     KOH Prep POC Negative Negative     Assessment/Plan: Candidal vaginitis - Plan: fluconazole (DIFLUCAN) 150 MG tablet, POCT Wet Prep with KOH; pos sx, exam, wet prep. Rx diflucan x 2. Restart probiotics, D/C THONGS. F/u prn. May need wkly preventive again.    Meds ordered this encounter  Medications   fluconazole (DIFLUCAN) 150 MG tablet    Sig: Take 1 tablet (150 mg total) by mouth once for 1 dose. May repeat in 3 days if still having symptoms    Dispense:  2 tablet    Refill:  0    Order Specific Question:   Supervising Provider    Answer:   Gae Dry [[id number]]       Return if symptoms worsen or fail to improve.  Terita Hejl B. Mallisa Alameda, PA-C 09/02/2021 10:33 AM

## 2021-09-25 ENCOUNTER — Ambulatory Visit: Payer: BLUE CROSS/BLUE SHIELD | Admitting: Obstetrics and Gynecology

## 2021-09-27 ENCOUNTER — Other Ambulatory Visit: Payer: Self-pay | Admitting: Primary Care

## 2021-09-27 ENCOUNTER — Other Ambulatory Visit: Payer: Self-pay | Admitting: Obstetrics and Gynecology

## 2021-09-27 DIAGNOSIS — B3731 Acute candidiasis of vulva and vagina: Secondary | ICD-10-CM

## 2021-09-27 DIAGNOSIS — E559 Vitamin D deficiency, unspecified: Secondary | ICD-10-CM

## 2021-10-04 ENCOUNTER — Other Ambulatory Visit: Payer: Self-pay | Admitting: Obstetrics and Gynecology

## 2021-10-04 ENCOUNTER — Encounter: Payer: Self-pay | Admitting: Obstetrics and Gynecology

## 2021-10-04 DIAGNOSIS — B3731 Acute candidiasis of vulva and vagina: Secondary | ICD-10-CM

## 2021-10-15 ENCOUNTER — Other Ambulatory Visit: Payer: Self-pay

## 2021-10-15 ENCOUNTER — Encounter: Payer: Self-pay | Admitting: Obstetrics and Gynecology

## 2021-10-15 ENCOUNTER — Ambulatory Visit (INDEPENDENT_AMBULATORY_CARE_PROVIDER_SITE_OTHER): Payer: BLUE CROSS/BLUE SHIELD | Admitting: Obstetrics and Gynecology

## 2021-10-15 VITALS — BP 124/90 | Ht 63.0 in | Wt 260.0 lb

## 2021-10-15 DIAGNOSIS — Z803 Family history of malignant neoplasm of breast: Secondary | ICD-10-CM

## 2021-10-15 DIAGNOSIS — B3731 Acute candidiasis of vulva and vagina: Secondary | ICD-10-CM | POA: Diagnosis not present

## 2021-10-15 DIAGNOSIS — Z01419 Encounter for gynecological examination (general) (routine) without abnormal findings: Secondary | ICD-10-CM | POA: Diagnosis not present

## 2021-10-15 DIAGNOSIS — K635 Polyp of colon: Secondary | ICD-10-CM

## 2021-10-15 MED ORDER — FLUCONAZOLE 150 MG PO TABS: ORAL_TABLET | ORAL | 0 refills | Status: DC

## 2021-10-15 NOTE — Progress Notes (Signed)
PCP:  Pleas Koch, NP   Chief Complaint  Patient presents with   Gynecologic Exam    No concerns     HPI:      Jean Pitts is a 36 y.o. [id number] whose LMP was No LMP recorded. Patient has had an ablation., presents today for her annual examination.  Her menses are random BTB, light spotting for a few days since endometrial ablation. Has occas dysmen, improved with NSAIDs.  Sex activity: single partner, contraception - tubal ligation. Has vaginal dryness with occas scant bleeding with wiping after sex. Hasn't done lubricants. Does use coconut oil ext as vaginal moisturizer. Was wearing thongs regularly and had hypertrophy of perineal skin. Pt notes that this area can be painful during sex.   Last Pap: 05/07/18  Results were: no abnormalities /neg HPV DNA Hx of STDs: HSV  Hx of recurrent yeast vag, needing wkly diflucan in past as preventive. Pt taking probiotics, has stopped wearing thongs since 12/22. Uses unscented soap, no dryer sheets.  Treated with diflucan 12/22 an 1/23 with sx relief. No sx today. Pt would like to restart wkly diflucan as preventive.   There is a FH of breast cancer in her [place] and mat aunt. Her cousin (aunt's daughter) had neg BRCA testing. There is a FH of ovarian cancer in her [place] in her 69s, then died with cancer mets in early 70s. Pt's mom almost certain it was ovarian and not cx. Genetic testing not done, but pt interested. The patient does not do self-breast exams.  Tobacco use: The patient denies current or previous tobacco use. Alcohol use: social drinker No drug use.  Exercise: min active  Had colonoscopy 2022 for internal bleeding. Found to have precancerous colon polyp with [facility], due for repeat in 1 yr.   She does get adequate calcium and Vitamin D in her diet. S/p bariatric surg, had normal labs last yr except Vit D deficiency on 8/22 labs; taking supp.  Past Medical History:  Diagnosis Date   Abnormal glucose tolerance test  in pregnancy, antepartum 06/04/2012   Formatting of this note might be different from the original. 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 eleview and spot ink tattoo used.;  Surgeon: Virgel Manifold, MD;  Location: [place];  Service: Endoscopy;  Laterality: N/A;   DIAGNOSTIC LAPAROSCOPY     ENDOMETRIAL BIOPSY     ENTEROSCOPY N/A 01/23/2021   Procedure: ENTEROSCOPY;  Surgeon: Virgel Manifold,  Location: [facility];  Service: Endoscopy;  Laterality: N/A;  ° ENTEROSCOPY N/A 02/04/2021  ° Procedure: ENTEROSCOPY;  Surgeon: Stark, Malcolm T, MD;  [facility];  Service: Gastroenterology;  Laterality: N/A;  ° ESOPHAGOGASTRODUODENOSCOPY (EGD) WITH PROPOFOL N/A 12/31/2020  ° Procedure: ESOPHAGOGASTRODUODENOSCOPY (EGD) WITH PROPOFOL;  Surgeon: Tahiliani, Varnita B, MD;  Location: [facility];  Service: Endoscopy;  Laterality: N/A;  ° FOREIGN BODY REMOVAL  02/04/2021  ° Procedure: FOREIGN BODY REMOVAL;  Surgeon: Stark, Malcolm T, MD;  Location[facility];  Service: Gastroenterology;;  staples from other gastric procedure  ° GASTRIC BYPASS    °  GIVENS CAPSULE STUDY N/A 02/19/2021  ° Procedure: GIVENS CAPSULE STUDY;  Surgeon: Tahiliani, Varnita B, MD;  Location: [facility];  Service: Endoscopy;  Laterality: N/A;  ° LASIK    ° MOUTH SURGERY    ° Tongue Cyst  ° NOVASURE ABLATION N/A 06/29/2015  ° Procedure: DILATION AND CURETTAGE, HYSTEROSCOPY, NOVASURE ABLATION;  Surgeon: Andreas Staebler, MD;  Location: [facility];  Service: Gynecology;  Laterality: N/A;  ° TUBAL LIGATION    ° WISDOM TOOTH EXTRACTION    ° ° °Family History  °Problem Relation Age of Onset  ° Hemangiomas Mother   °     benign osseous 2022 MRI  ° Migraines Mother   ° Obesity Mother   ° Hyperlipidemia Mother   ° Obesity Sister   ° Obesity Sister   ° Obesity Brother   ° Neuropathy Maternal Uncle   ° Ovarian cancer Maternal Grandmother 26  °     Cervical/Sqammacell  ° ° °Social History  ° °Socioeconomic History  ° Marital status: Married  °  Spouse name: Not on file  ° Number of children: 2  ° Years of education: Not on file  ° Highest education level: Some college, no degree  °Occupational History  ° Occupation: adminstrative assistant   °Tobacco Use  ° Smoking status: Never  ° Smokeless tobacco: Never  °Vaping Use  ° Vaping Use: Never used  °Substance and Sexual Activity  ° Alcohol use: Not Currently  °  Comment: RARE  ° Drug use: No  ° Sexual activity: Yes  °  Partners: Male  °  Birth control/protection: Surgical  °  Comment: Ablation  °Other Topics Concern  ° Not on file  °Social History Narrative  °   ° She also goes to school full time at [employer]  ° °Social Determinants of Health  ° °Financial Resource Strain: Not on file  °Food Insecurity: Not on file  °Transportation Needs: Not on file  °Physical Activity: Not on file  °Stress: Not on file  °Social Connections: Not on file  °Intimate Partner Violence: Not on file  ° ° ° °Current Outpatient Medications:  °  cyclobenzaprine (FLEXERIL) 5 MG tablet, Take 1 tablet by mouth once daily as needed for migraines., Disp: 30 tablet, Rfl: 0 °   fluconazole (DIFLUCAN) 150 MG tablet, Take 1 tab once weekly as preventive for 3 months, Disp: 15 tablet, Rfl: 0 °  Fremanezumab-vfrm (AJOVY) 225 MG/1.5ML SOAJ, Inject 225 mg into the skin every 30 (thirty) days., Disp: 1.5 mL, Rfl: 11 °  ondansetron (ZOFRAN) 4 MG tablet, Take 1 tablet (4 mg total) by mouth every 8 (eight) hours as needed for up to 20 doses for nausea or vomiting., Disp: 20 tablet, Rfl: 0 °  SUMAtriptan Succinate Refill 6 MG/0.5ML SOCT, Inject 6 mg into the skin at migraine onset, may repeat with 6 mg 2 hours later if   migraine persists., Disp: 15 mL, Rfl: 0 °  Vitamin D, Ergocalciferol, (DRISDOL) 1.25 MG (50000 UNIT) CAPS capsule, Take 1 capsule (50,000 Units total) by mouth every 7 (seven) days. Office visit required for further refills., Disp: 4 capsule, Rfl: 0 ° ° ° ° °ROS: ° °Review of Systems  °Constitutional:  Negative for fatigue, fever and unexpected weight change.  °Respiratory:  Negative for cough, shortness of breath and wheezing.   °Cardiovascular:  Negative for chest pain, palpitations and leg swelling.  °Gastrointestinal:  Negative for blood in stool, constipation, diarrhea, nausea and vomiting.  °Endocrine: Negative for cold intolerance, heat intolerance and polyuria.  °Genitourinary:  Negative for dyspareunia, dysuria, flank pain, frequency, genital sores, hematuria, menstrual problem, pelvic pain, urgency, vaginal bleeding, vaginal discharge and vaginal pain.  °Musculoskeletal:  Negative for back pain, joint swelling and myalgias.  °Skin:  Negative for rash.  °Neurological:  Negative for dizziness, syncope, light-headedness, numbness and headaches.  °Hematological:  Negative for adenopathy.  °Psychiatric/Behavioral:  Negative for agitation, confusion, sleep disturbance and suicidal ideas. The patient is not nervous/anxious.  BREAST: No symptoms ° ° °Objective: °BP 124/90    Ht 5' 3" (1.6 m)    Wt 260 lb (117.9 kg)    BMI 46.06 kg/m²  ° ° °Physical Exam °Constitutional:   °    Appearance: She is well-developed.  °Genitourinary:  °   Vulva normal.  °   Right Labia: No rash, tenderness or lesions. °   Left Labia: No tenderness, lesions or rash. °   No vaginal discharge, erythema or tenderness.  ° °   Right Adnexa: not tender and no mass present. °   Left Adnexa: not tender and no mass present. °   No cervical friability or polyp.  °   Uterus is not enlarged or tender.  °Breasts: °   Right: No mass, nipple discharge, skin change or tenderness.  °   Left: No mass, nipple discharge, skin change or tenderness.  °Neck:  °   Thyroid: No thyromegaly.  °Cardiovascular:  °   Rate and Rhythm: Normal rate and regular rhythm.  °   Heart sounds: Normal heart sounds. No murmur heard. °Pulmonary:  °   Effort: Pulmonary effort is normal.  °   Breath sounds: Normal breath sounds.  °Abdominal:  °   Palpations: Abdomen is soft.  °   Tenderness: There is no abdominal tenderness. There is no guarding or rebound.  °Musculoskeletal:     °   General: Normal range of motion.  °   Cervical back: Normal range of motion.  °Lymphadenopathy:  °   Cervical: No cervical adenopathy.  °Neurological:  °   General: No focal deficit present.  °   Mental Status: She is alert and oriented to person, place, and time.  °   Cranial Nerves: No cranial nerve deficit.  °Skin: °   General: Skin is warm and dry.  °Psychiatric:     °   Mood and Affect: Mood normal.     °   Behavior: Behavior normal.     °   Thought Content: Thought content normal.     °   Judgment: Judgment normal.  °Vitals reviewed.  ° ° °Assessment/Plan: °Encounter for annual routine gynecological examination ° °Family history of breast cancer - Plan: Integrated BRACAnalysis ([facility]); MyRisk testing discussed and done today. Will f/u with results.  ° °Yeast vaginitis - Plan: fluconazole (DIFLUCAN) 150 MG tablet; hx of recurrent sx, no sx today.   Rx diflucan for wkly prevention for 3 months. Cont probiotics. F/u prn.  ° °Polyp of ascending colon,  unspecified type--repeat colonoscopy due 4/23; pt to contact [facility] to schedule. Will send ref prn.  ° ° °Meds ordered this encounter  °Medications  ° fluconazole (DIFLUCAN) 150 MG tablet  °  Sig: Take 1 tab once weekly as preventive for 3 months  °  Dispense:  15 tablet  °  Refill:  0  °  Order Specific Question:   Supervising Provider  °  Answer:   HARRIS, ROBERT P [[id number]]  ° °          °GYN counsel adequate intake of calcium and vitamin D, diet and exercise ° ° °  F/U ° Return in about 1 year (around 10/15/2022). ° °Alicia B. Copland, PA-C °10/15/2021 °2:35 PM °

## 2021-10-15 NOTE — Patient Instructions (Signed)
I value your feedback and you entrusting us with your care. If you get a  patient survey, I would appreciate you taking the time to let us know about your experience today. Thank you! ? ? ?

## 2021-10-22 ENCOUNTER — Telehealth (INDEPENDENT_AMBULATORY_CARE_PROVIDER_SITE_OTHER): Payer: BLUE CROSS/BLUE SHIELD | Admitting: Adult Health

## 2021-10-22 DIAGNOSIS — G43709 Chronic migraine without aura, not intractable, without status migrainosus: Secondary | ICD-10-CM | POA: Diagnosis not present

## 2021-10-22 NOTE — Progress Notes (Signed)
PATIENT: Jean Pitts DOB: 03/06/1986  REASON FOR VISIT: follow up HISTORY FROM: patient  Virtual Visit via Video Note  I connected with Jean Pitts on 10/22/21 at  3:00 PM EST by a video enabled telemedicine application located remotely at [facility] and verified that I am speaking with the correct person using two identifiers who was located at their own home.   I discussed the limitations of evaluation and management by telemedicine and the availability of in person appointments. The patient expressed understanding and agreed to proceed.   PATIENT: Jean Pitts DOB: Aug 04, 1986  REASON FOR VISIT: follow up HISTORY FROM: patient  HISTORY OF PRESENT ILLNESS: Today 10/22/21:  Jean Pitts is a 36 year old female with a history of migraine headaches.  She returns today for follow-up.  She states that Ajovy has been beneficial but she still has frequent headaches.  She states that she has a migraine headache approximately 1 day a week.  She has a dull headache at least 3 to 4 days a week.  Her headaches are typically in the frontal region.  She does take Tylenol for these daily dull headaches.  For severe headaches that do not respond to Tylenol she will take Imitrex injection.  Reports that it does make her sleepy so she cannot take while she is at work or if she is going to drive.  HISTORY Jean Pitts is a 36 y.o. female here as requested by Jean Koch, NP for headaches.  Past medical history migraines, hyperlipidemia, hypertension, obesity, syncope.  I reviewed Jean Pitts notes: Patient has ongoing dizziness and fatigue, dizziness does not occur if sitting or remaining still, she has to have supervision shower, she is currently managed on propranolol extended release 80 mg for migraines, she is also tried Topamax which causes drowsiness and amitriptyline which was not effective, she is experiencing headaches near daily.   She has had  headache since she can remember, mother with migraines, always throbbing, worse over the years, 3 years ago she had the most severe, she had to go to the ED and has been multiple times since; the migraines are unilateral, pulsating/pounding, stiffness in the neck, nausea, photophobia/phonophobia, they "put me down", a dark room helps, stress and sleeping can affect it, hasn't noticed weather affecting her. Daily headaches. Mod to severe migraine at least 1/2 the month 15 days, can last 24-72 hours at least, sometimes OTC meds help and the imitrex but doesn't completely take it away sometimes she gets it under the control but it is still there. No aura. She had dizziness but she has been having a lot dizziness recently, unclear if associated with migraines or not. She has been up to 200mg  of topamax and she is a "zombie" cognitive issues, amitriptylone also gave side effects, propranolol gave her dizziness and heart rate and BP issues. She does not snore, wakes with headache, blurry vision when the headaches are bad, eyes hurt and so sensitivie, morning headaches can be the worse when supine. No other focal neurologic deficits, associated symptoms, inciting events or modifiable factors.     Reviewed notes, labs and imaging from outside physicians, which showed :   Tried: amitriptyline, propranolol, atenolol, topamax, flexeril(all side effects)   CT head 05/2006 showed No acute intracranial abnormalities including mass lesion or mass effect, hydrocephalus, extra-axial fluid collection, midline shift, hemorrhage, or acute infarction, large ischemic events (personally reviewed images)   Cbc/cmp unremarkable 04/2021 01/18/2021: tsh nml 1.29  REVIEW OF SYSTEMS: Out of a complete 14 system review of symptoms, the patient complains only of the following symptoms, and all other reviewed systems are negative.  ALLERGIES: Allergies  Allergen Reactions   Augmentin [Amoxicillin-Pot Clavulanate]     diarrhea    Codeine Other (See Comments)    Other reaction(s): Other (See Comments)   Hydrocodone-Acetaminophen Nausea And Vomiting    HOME MEDICATIONS: Outpatient Medications Prior to Visit  Medication Sig Dispense Refill   cyclobenzaprine (FLEXERIL) 5 MG tablet Take 1 tablet by mouth once daily as needed for migraines. 30 tablet 0   fluconazole (DIFLUCAN) 150 MG tablet Take 1 tab once weekly as preventive for 3 months 15 tablet 0   Fremanezumab-vfrm (AJOVY) 225 MG/1.5ML SOAJ Inject 225 mg into the skin every 30 (thirty) days. 1.5 mL 11   ondansetron (ZOFRAN) 4 MG tablet Take 1 tablet (4 mg total) by mouth every 8 (eight) hours as needed for up to 20 doses for nausea or vomiting. 20 tablet 0   SUMAtriptan Succinate Refill 6 MG/0.5ML SOCT Inject 6 mg into the skin at migraine onset, may repeat with 6 mg 2 hours later if migraine persists. 15 mL 0   Vitamin D, Ergocalciferol, (DRISDOL) 1.25 MG (50000 UNIT) CAPS capsule Take 1 capsule (50,000 Units total) by mouth every 7 (seven) days. Office visit required for further refills. 4 capsule 0   No facility-administered medications prior to visit.    PAST MEDICAL HISTORY: Past Medical History:  Diagnosis Date   Abnormal glucose tolerance test in pregnancy, antepartum 06/04/2012   Formatting of this note might be different from the original. 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 eleview and spot ink tattoo used.;  Surgeon: Virgel Manifold, MD;  Location: [place];  Service: Endoscopy;  Laterality: N/A;   DIAGNOSTIC LAPAROSCOPY     ENDOMETRIAL BIOPSY     ENTEROSCOPY N/A 01/23/2021   Procedure: ENTEROSCOPY;  Surgeon: Virgel Manifold, MD;  Location: [facility];  Service: Endoscopy;  Laterality: N/A;   ENTEROSCOPY N/A 02/04/2021   Procedure: ENTEROSCOPY;  Surgeon: Ladene Artist, MD;  [facility];  Service: Gastroenterology;  Laterality: N/A;   ESOPHAGOGASTRODUODENOSCOPY (EGD) WITH PROPOFOL N/A 12/31/2020   Procedure: ESOPHAGOGASTRODUODENOSCOPY (EGD) WITH PROPOFOL;  Surgeon: Virgel Manifold, MD;  Location: [place];  Service: Endoscopy;  Laterality: N/A;   FOREIGN BODY REMOVAL  02/04/2021   Procedure: FOREIGN BODY REMOVAL;  Surgeon: Ladene Artist, MD;  Location:  [facility];  Service: Gastroenterology;;  staples from other gastric procedure   GASTRIC BYPASS     GIVENS CAPSULE STUDY N/A 02/19/2021   Procedure: GIVENS CAPSULE STUDY;  Surgeon: Virgel Manifold, MD;  Location: [facility];  Service: Endoscopy;  Laterality: N/A;   LASIK     MOUTH SURGERY     Tongue Cyst   NOVASURE ABLATION N/A 06/29/2015   Procedure: DILATION AND CURETTAGE, HYSTEROSCOPY, NOVASURE ABLATION;  Surgeon: Malachy Mood, MD;  Location: [facility];  Service: Gynecology;  Laterality: N/A;   TUBAL LIGATION     WISDOM TOOTH EXTRACTION      FAMILY HISTORY: Family History  Problem Relation Age of Onset   Hemangiomas Mother        benign osseous 2022 MRI   Migraines Mother    Obesity Mother    Hyperlipidemia Mother    Obesity Sister    Obesity Sister    Obesity  Brother    Ovarian cancer Maternal Grandmother 26       Cervical/Sqammacell   Breast cancer Maternal Aunt    Neuropathy Maternal Uncle    Breast cancer Maternal Great-grandmother    Skin cancer Maternal Great-grandmother     SOCIAL HISTORY: Social History   Socioeconomic History   Marital status: Married    Spouse name: Not on file   Number of children: 2   Years of education: Not on file   Highest education level: Some college, no degree  Occupational History   Occupation: Multimedia programmer   Tobacco Use   Smoking status: Never   Smokeless tobacco: Never  Vaping Use   Vaping Use: Never used  Substance and Sexual Activity   Alcohol use: Not Currently    Comment: RARE   Drug use: No   Sexual activity: Yes    Partners: Male    Birth control/protection: Surgical    Comment: Ablation  Other Topics Concern   Not on file  Social History Narrative      She also goes to school full time at [employer]   Social Determinants of Health   Financial Resource Strain: Not on file  Food Insecurity: Not on file  Transportation Needs: Not on file  Physical Activity: Not on file  Stress: Not on file  Social Connections: Not on file  Intimate Partner Violence: Not on file      PHYSICAL EXAM Generalized: Well developed, in no acute distress   Neurological examination  Mentation: Alert oriented to time, place, history taking. Follows all commands speech and language fluent Cranial nerve II-XII:Extraocular movements were full. Facial symmetry noted.  Head turning and shoulder shrug  were normal and symmetric. Coordination: Cerebellar testing reveals good finger-nose-finger   Reflexes: UTA  DIAGNOSTIC DATA (LABS, IMAGING, TESTING) - I reviewed patient records, labs, notes, testing and imaging myself where available.  Lab Results  Component Value Date   WBC 6.0 05/09/2021   HGB 12.6 05/09/2021   HCT 37.4 05/09/2021   MCV 89.3 05/09/2021   PLT 308 05/09/2021       Component Value Date/Time   NA 136 05/09/2021 1008   NA 140 06/25/2020 1521   K 3.6 05/09/2021 1008   CL 102 05/09/2021 1008   CO2 27 05/09/2021 1008   GLUCOSE 93 05/09/2021 1008   BUN 8 05/09/2021 1008   BUN 8 06/25/2020 1521   CREATININE 0.70 05/09/2021 1008   CALCIUM 8.9 05/09/2021 1008   PROT 7.1 05/09/2021 1008   PROT 7.0 06/25/2020 1521   ALBUMIN  3.9 05/09/2021 1008   ALBUMIN 4.3 06/25/2020 1521   AST 26 05/09/2021 1008   ALT 30 05/09/2021 1008   ALKPHOS 74 05/09/2021 1008   BILITOT 0.8 05/09/2021 1008   BILITOT 0.2 06/25/2020 1521   GFRNONAA >60 05/09/2021 1008   GFRAA 118 06/25/2020 1521   Lab Results  Component Value Date   CHOL 204 (H) 01/18/2021   HDL 51.30 01/18/2021   LDLCALC 129 (H) 01/18/2021   TRIG 116.0 01/18/2021   CHOLHDL 4 01/18/2021   Lab Results  Component Value Date   HGBA1C 5.3 01/18/2021   Lab Results  Component Value Date   VITAMINB12 290 05/09/2021   Lab Results  Component Value Date   TSH 1.29 01/18/2021      ASSESSMENT AND PLAN 35 y.o. year old female  has a past medical history of Abnormal glucose tolerance test in pregnancy, antepartum (06/04/2012), Anemia, B12 deficiency, Family history of adverse reaction to anesthesia, Fecal occult blood test positive, Genital herpes, GERD (gastroesophageal reflux disease), H/O cesarean section complicating pregnancy (96/78/9381), H/O gastric bypass (02/19/2021), Hair thinning (05/12/2018), Headache, Heartburn (10/26/2018), Hyperlipidemia, Hypertension, Migraine, Obesity, PONV (postoperative nausea and vomiting), Syncope (04/2015), Vitamin D deficiency, and Yeast vaginitis (02/23/2019). here with :  1.  Migraine headaches  Continue Ajovy for now We will start Botox therapy if approved through her insurance Continue Imitrex injection for abortive therapy Advised to limit her use of Tylenol as it may be causing rebound headaches Next appointment will be for Botox injections     Ward Givens, MSN, NP-C 10/22/2021, 3:02 PM [facility] [address], [place], [place] [address] [phone]

## 2021-10-23 ENCOUNTER — Telehealth: Payer: Self-pay | Admitting: Adult Health

## 2021-10-23 DIAGNOSIS — G43709 Chronic migraine without aura, not intractable, without status migrainosus: Secondary | ICD-10-CM

## 2021-10-23 MED ORDER — BOTOX 200 UNITS IJ SOLR: INTRAMUSCULAR | 3 refills | Status: DC

## 2021-10-23 NOTE — Telephone Encounter (Signed)
I called [employer] spoke with Robbie(ref#[id number]) he states codes [phone] and [phone] are covered and PA is not required. However everything to do with Botox will have to go through [facility]. Please call to get Botox RX sent.

## 2021-10-23 NOTE — Telephone Encounter (Signed)
Botox Rx sent to [facility] in [place].

## 2021-10-24 ENCOUNTER — Other Ambulatory Visit: Payer: Self-pay | Admitting: Primary Care

## 2021-10-24 DIAGNOSIS — E559 Vitamin D deficiency, unspecified: Secondary | ICD-10-CM

## 2021-10-28 ENCOUNTER — Other Ambulatory Visit: Payer: Self-pay

## 2021-10-28 ENCOUNTER — Telehealth (INDEPENDENT_AMBULATORY_CARE_PROVIDER_SITE_OTHER): Payer: BLUE CROSS/BLUE SHIELD | Admitting: Primary Care

## 2021-10-28 ENCOUNTER — Encounter: Payer: BLUE CROSS/BLUE SHIELD | Admitting: Primary Care

## 2021-10-28 ENCOUNTER — Other Ambulatory Visit: Payer: Self-pay | Admitting: Primary Care

## 2021-10-28 ENCOUNTER — Encounter: Payer: Self-pay | Admitting: Primary Care

## 2021-10-28 VITALS — Ht 63.0 in | Wt 260.0 lb

## 2021-10-28 DIAGNOSIS — J029 Acute pharyngitis, unspecified: Secondary | ICD-10-CM | POA: Diagnosis not present

## 2021-10-28 DIAGNOSIS — Z6841 Body Mass Index (BMI) 40.0 and over, adult: Secondary | ICD-10-CM

## 2021-10-28 MED ORDER — WEGOVY 0.25 MG/0.5ML ~~LOC~~ SOAJ: 0.2500 mg | SUBCUTANEOUS | 0 refills | Status: DC

## 2021-10-28 NOTE — Telephone Encounter (Signed)
Received voicemail from [place] with [facility] needing patients insurance information. Returned call spoke with Daleen Snook and provided the information that was needed.

## 2021-10-28 NOTE — Progress Notes (Signed)
Patient ID: Jean Pitts, female    DOB: Oct 04, 1985, 36 y.o.   MRN: [id number]  Virtual visit completed through [employer], a video enabled telemedicine application. Due to national recommendations of social distancing due to COVID-19, a virtual visit is felt to be most appropriate for this patient at this time. Reviewed limitations, risks, security and privacy concerns of performing a virtual visit and the availability of in person appointments. I also reviewed that there may be a patient responsible charge related to this service. The patient agreed to proceed.   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, office Persons participating in this virtual visit: patient, provider   If any vitals were documented, they were collected by patient at home unless specified below.    Ht 5' 3" (1.6 m)    Wt 260 lb (117.9 kg)    BMI 46.06 kg/m    CC: Obesity Subjective:   HPI: Jean Pitts is a 36 y.o. female with a history of gastric bypass (Roux-en-Y), morbid obesity, anemia, PCOS presenting on 10/28/2021 to discuss several things.  1) Sore Throat: Symptom onset yesterday. Also with post nasal drip. She denies fevers. She's had a cough for about 1 month, attributes this to drainage. Denies sick contacts, but her son's girlfriend has had a sore throat all weekend. She is at work today.  2) Morbid Obesity: History of Roux-en-Y gastric bypass in 2014, lost about 100 pounds at the time. Has regained most of this weight back.   Long history of weight loss with weight gain secondary to poor eating decisions and eating too much. She does struggle with anxiety/depression, is working with a therapist now. She was unable to be physically active last year due to her ongoing dizziness from anemia.   She has been working on her diet, has been off Cedro for the last month.   Diet currently consists of:  Breakfast: Protein shake, granola bar Lunch: Tuna kit,  sandwich, snacks, chicken salad and crackers Dinner: Meat, starch, salad; take out and home cooked meals. On the go a lot.  Snacks: Raw veggies, chips  Desserts: Daily  Beverages: Some water, regular soda, mostly diet soda now  Exercise: No regular exercise. Sometime walking   She was previously managed on Phentermine, did well on this in the past. She's also done carb counting, calorie counting, Atkins, Weight Watchers, Keto diet. She was successful but quickly regained her weight.   She is wanting to start [facility]. Has several friends who have seen great results.         Relevant past medical, surgical, family and social history reviewed and updated as indicated. Interim medical history since our last visit reviewed. Allergies and medications reviewed and updated. Outpatient Medications Prior to Visit  Medication Sig Dispense Refill   ASHWAGANDHA PO Take by mouth.     Botulinum Toxin Type A (BOTOX) 200 units SOLR Provider to inject 155 units into the muscles of the head and neck every 3 months. Discard remainder. 1 each 3   cyclobenzaprine (FLEXERIL) 5 MG tablet Take 1 tablet by mouth once daily as needed for migraines. 30 tablet 0   fluconazole (DIFLUCAN) 150 MG tablet Take 1 tab once weekly as preventive for 3 months 15 tablet 0   Fremanezumab-vfrm (AJOVY) 225 MG/1.5ML SOAJ Inject 225 mg into the skin every 30 (thirty) days. 1.5 mL 11   ondansetron (ZOFRAN) 4 MG tablet Take 1 tablet (4 mg total) by mouth every  8 (eight) hours as needed for up to 20 doses for nausea or vomiting. 20 tablet 0   OVER THE COUNTER MEDICATION Maca root Extra strength     OVER THE COUNTER MEDICATION Olly Multi Strain Vaginal     OVER THE COUNTER MEDICATION Olly Multivitamin     SUMAtriptan Succinate Refill 6 MG/0.5ML SOCT Inject 6 mg into the skin at migraine onset, may repeat with 6 mg 2 hours later if migraine persists. 15 mL 0   Vitamin D, Ergocalciferol, (DRISDOL) 1.25 MG (50000 UNIT) CAPS capsule  TAKE 1 CAPSULE BY MOUTH EVERY 7 (SEVEN) DAYS. OFFICE VISIT REQUIRED FOR FURTHER REFILLS. 4 capsule 0   No facility-administered medications prior to visit.     Per HPI unless specifically indicated in ROS section below Review of Systems  Constitutional:  Positive for fatigue. Negative for chills and fever.  HENT:  Positive for postnasal drip and sore throat.   Respiratory:  Negative for cough and shortness of breath.   Gastrointestinal:  Negative for nausea.  Objective:  Ht 5' 3" (1.6 m)    Wt 260 lb (117.9 kg)    BMI 46.06 kg/m   Wt Readings from Last 3 Encounters:  10/28/21 260 lb (117.9 kg)  10/15/21 260 lb (117.9 kg)  09/02/21 255 lb (115.7 kg)       Physical exam: General: Alert and oriented x 3, no distress, does not appear sickly  Pulmonary: Speaks in complete sentences without increased work of breathing, no cough during visit.  Psychiatric: Normal mood, thought content, and behavior.     Results for orders placed or performed in visit on 09/02/21  POCT Wet Prep with KOH  Result Value Ref Range   Trichomonas, UA Negative    Clue Cells Wet Prep HPF POC neg    Epithelial Wet Prep HPF POC     Yeast Wet Prep HPF POC pos    Bacteria Wet Prep HPF POC     RBC Wet Prep HPF POC     WBC Wet Prep HPF POC     KOH Prep POC Negative Negative   Assessment & Plan:   Problem List Items Addressed This Visit       Other   Class 3 severe obesity due to excess calories with body mass index (BMI) of 45.0 to 49.9 in [facility]) - Primary    Long discussion today regarding the need for continued improvement in her diet and to start exercising.   Discussed the risk associated with GLP-1 agonist medications given her gastric bypass history which include nausea, dehydration, pancreatitis. We also discussed other common side effects. She verbalized understanding and elects to proceed.  [name] is not approved for obesity treatment at this time so we will proceed with Wegovy at the  0.25 mg once weekly dose.  Rx sent to pharmacy.   We will see her in the office in 2 weeks.       Relevant Medications   Semaglutide-Weight Management (WEGOVY) 0.25 MG/0.5ML SOAJ   Sore throat    Likely secondary to post nasal drip, also discussed the possibility for GERD.  She will trial an antihistamine for now. Follow up PRN.        Meds ordered this encounter  Medications   Semaglutide-Weight Management (WEGOVY) 0.25 MG/0.5ML SOAJ    Sig: Inject 0.25 mg into the skin once a week.    Dispense:  2 mL    Refill:  0    Order Specific Question:  Supervising Provider    Answer:   Diona Browner, AMY E [[id number]]   No orders of the defined types were placed in this encounter.   I discussed the assessment and treatment plan with the patient. The patient was provided an opportunity to ask questions and all were answered. The patient agreed with the plan and demonstrated an understanding of the instructions. The patient was advised to call back or seek an in-person evaluation if the symptoms worsen or if the condition fails to improve as anticipated.  Follow up plan:  Start Wegovy 0.25 mg injection for weight loss. Inject once weekly into the skin.   We will see you in a few weeks!  It was a pleasure to see you today!   Pleas Koch, NP

## 2021-10-28 NOTE — Assessment & Plan Note (Signed)
Likely secondary to post nasal drip, also discussed the possibility for GERD.  She will trial an antihistamine for now. Follow up PRN.

## 2021-10-28 NOTE — Assessment & Plan Note (Signed)
Long discussion today regarding the need for continued improvement in her diet and to start exercising.   Discussed the risk associated with GLP-1 agonist medications given her gastric bypass history which include nausea, dehydration, pancreatitis. We also discussed other common side effects. She verbalized understanding and elects to proceed.  [name] is not approved for obesity treatment at this time so we will proceed with Wegovy at the 0.25 mg once weekly dose.  Rx sent to pharmacy.   We will see her in the office in 2 weeks.

## 2021-10-28 NOTE — Patient Instructions (Signed)
Start Wegovy 0.25 mg injection for weight loss. Inject once weekly into the skin.   We will see you in a few weeks!  It was a pleasure to see you today!

## 2021-10-29 ENCOUNTER — Encounter: Payer: BLUE CROSS/BLUE SHIELD | Admitting: Primary Care

## 2021-10-30 DIAGNOSIS — Z1371 Encounter for nonprocreative screening for genetic disease carrier status: Secondary | ICD-10-CM

## 2021-10-30 DIAGNOSIS — Z803 Family history of malignant neoplasm of breast: Secondary | ICD-10-CM

## 2021-10-30 HISTORY — DX: Encounter for nonprocreative screening for genetic disease carrier status: Z13.71

## 2021-10-30 HISTORY — DX: Family history of malignant neoplasm of breast: Z80.3

## 2021-10-30 NOTE — Telephone Encounter (Signed)
It looks like the Mancel Parsons is not covered on her insurance. Is there a PA on this or are they just flat out refusing to cover?  If they are refusing then notify patient that she could try to find a coupon card online, or we could try Victoza or Saxenda which are daily injectable medications very similar to [facility].  I do recommend she check with her insurance company regarding coverage on those.

## 2021-10-31 ENCOUNTER — Encounter: Payer: BLUE CROSS/BLUE SHIELD | Admitting: Primary Care

## 2021-10-31 ENCOUNTER — Encounter: Payer: Self-pay | Admitting: Obstetrics and Gynecology

## 2021-11-04 NOTE — Telephone Encounter (Signed)
Cover my meds it has been denied. They will not cover. I have sent my chart message to patient with information in message from [facility].

## 2021-11-05 ENCOUNTER — Encounter: Payer: Self-pay | Admitting: Obstetrics and Gynecology

## 2021-11-07 ENCOUNTER — Telehealth: Payer: Self-pay | Admitting: Obstetrics and Gynecology

## 2021-11-07 NOTE — Telephone Encounter (Signed)
Pt aware of neg MyRisk results. IBIS=12.9%/riskscore=17.1%. Pt aware there are no increased screening recommendations at this time.   Patient understands these results only apply to her and her children, and this is not indicative of genetic testing results of her other family members. It is recommended that her other family members have genetic testing done.  Pt also understands negative genetic testing doesn't mean she will never get any of these cancers.   Hard copy mailed to pt. F/u prn.

## 2021-11-12 ENCOUNTER — Ambulatory Visit (INDEPENDENT_AMBULATORY_CARE_PROVIDER_SITE_OTHER): Payer: BLUE CROSS/BLUE SHIELD | Admitting: Primary Care

## 2021-11-12 ENCOUNTER — Other Ambulatory Visit: Payer: Self-pay | Admitting: Primary Care

## 2021-11-12 ENCOUNTER — Other Ambulatory Visit: Payer: Self-pay

## 2021-11-12 ENCOUNTER — Encounter: Payer: Self-pay | Admitting: Primary Care

## 2021-11-12 VITALS — BP 126/86 | HR 72 | Temp 98.6°F | Ht 63.5 in | Wt 258.0 lb

## 2021-11-12 DIAGNOSIS — R5382 Chronic fatigue, unspecified: Secondary | ICD-10-CM

## 2021-11-12 DIAGNOSIS — Z6841 Body Mass Index (BMI) 40.0 and over, adult: Secondary | ICD-10-CM | POA: Diagnosis not present

## 2021-11-12 DIAGNOSIS — F411 Generalized anxiety disorder: Secondary | ICD-10-CM | POA: Diagnosis not present

## 2021-11-12 DIAGNOSIS — D509 Iron deficiency anemia, unspecified: Secondary | ICD-10-CM | POA: Diagnosis not present

## 2021-11-12 LAB — HEMOGLOBIN A1C: Hgb A1c MFr Bld: 5.3 % (ref 4.6–6.5)

## 2021-11-12 LAB — FOLLICLE STIMULATING HORMONE: FSH: 5.1 m[IU]/mL

## 2021-11-12 LAB — TSH: TSH: 1.77 u[IU]/mL (ref 0.35–5.50)

## 2021-11-12 LAB — LUTEINIZING HORMONE: LH: 2.84 m[IU]/mL

## 2021-11-12 MED ORDER — WEGOVY 0.25 MG/0.5ML ~~LOC~~ SOAJ: 0.2500 mg | SUBCUTANEOUS | 0 refills | Status: DC

## 2021-11-12 NOTE — Patient Instructions (Addendum)
Stop by the lab prior to leaving today. I will notify you of your results once received.   Start Wegovy for weight loss. Inject 0.25 mg into the skin once weekly for 4 weeks, then increase to 0.5 mg weekly thereafter.  We will see you in a few months for follow up.  It was a pleasure to see you today!

## 2021-11-12 NOTE — Assessment & Plan Note (Signed)
Could be multifactorial including anxiety/stress, metabolic cause.  Checking labs today including CBC, iron studies, hormone levels per patient request, A1C given weight gain.

## 2021-11-12 NOTE — Progress Notes (Signed)
Subjective:    Patient ID: Jean Pitts, female    DOB: 04-03-1986, 36 y.o.   MRN: [id number]  HPI  Jean Pitts is a very pleasant 36 y.o. female with a history of migraines, PCOS, morbid obesity, hyperlipidemia , gastric bypass (Roux-en-Y), iron deficiency anemia who presents today to discuss morbid obesity and weight loss and chronic fatigue.   She was last evaluated virtually a few weeks ago. Endorsed continued weight gain after the last year, admitted to binge and stress eating. During this visit she was requesting treatment for obesity with Mounjaro medication. She has no history of type 2 diabetes so we decided to initiate [facility].   Since her last visit she has not started [facility] as her insurance denied coverage. She has previously tried [place], prior to gastric bypass surgery, isn't sure if this was effective.   She is working with her therapist on her anxiety and binge eating, is also making poor food choices during the day. She has little to no energy, has not had CBC and iron levels checked in months. She also has low libido, is wanting hormone levels checked.   Her job is very stressful, will work long hours, is the only person who can do her role at work.   Wt Readings from Last 3 Encounters:  11/12/21 258 lb (117 kg)  10/28/21 260 lb (117.9 kg)  10/15/21 260 lb (117.9 kg)     Review of Systems  Constitutional:  Positive for fatigue.  Respiratory:  Negative for shortness of breath.   Cardiovascular:  Negative for chest pain.  Gastrointestinal:  Negative for blood in stool.  Psychiatric/Behavioral:  The patient is nervous/anxious.         Past Medical History:  Diagnosis Date   Abnormal glucose tolerance test in pregnancy, antepartum 06/04/2012   Formatting of this note might be different from the original. Needs 3h GTT   Anemia    B12 deficiency    BRCA negative 10/2021   MyRisk neg   Family history of adverse reaction to anesthesia    MATERNAL AUNT-PT  UNAWARE OF WHAT HAPPENED WITH ANESTHESIA BUT STATES HER AUNT HAS HAD PROBLEMS IN PAST    Family history of breast cancer 10/2021   IBIS=12.9%/riskscore=17.1%   Family history of ovarian cancer    Fecal occult blood test positive    Genital herpes    GERD (gastroesophageal reflux disease)    H/O cesarean section complicating pregnancy 26/20/3559   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/19/2021   Hair thinning 05/12/2018   Headache    MIGRAINE   Heartburn 10/26/2018   Hyperlipidemia    Hypertension    Migraine    Obesity    PONV (postoperative nausea and vomiting)    Syncope 04/2015   Vitamin D deficiency    Yeast vaginitis 02/23/2019   Candida albicans on culture    Social History   Socioeconomic History   Marital status: Married    Spouse name: Not on file   Number of children: 2   Years of education: Not on file   Highest education level: Some college, no degree  Occupational History   Occupation: Multimedia programmer   Tobacco Use   Smoking status: Never   Smokeless tobacco: Never  Vaping Use   Vaping Use: Never used  Substance and Sexual Activity   Alcohol use: Not Currently    Comment: RARE   Drug use: No   Sexual activity: Yes    Partners: Male  Birth control/protection: Surgical    Comment: Ablation  Other Topics Concern   Not on file  Social History Narrative      She also goes to school full time at [employer] Determinants of Health   Financial Resource Strain: Not on file  Food Insecurity: Not on file  Transportation Needs: Not on file  Physical Activity: Not on file  Stress: Not on file  Social Connections: Not on file  Intimate Partner Violence: Not on file    Past Surgical History:  Procedure Laterality Date   APPENDECTOMY     BUNIONECTOMY     CESAREAN SECTION     X2   COLONOSCOPY WITH PROPOFOL N/A 12/31/2020   Procedure: COLONOSCOPY WITH PROPOFOL. [facility] and spot ink tattoo used.;  Surgeon: Virgel Manifold, MD;  Location: [facility];  Service: Endoscopy;  Laterality: N/A;   DIAGNOSTIC LAPAROSCOPY     ENDOMETRIAL BIOPSY     ENTEROSCOPY N/A 01/23/2021   Procedure: ENTEROSCOPY;  Surgeon: Virgel Manifold, MD;  Location: [facility];  Service: Endoscopy;  Laterality: N/A;   ENTEROSCOPY N/A 02/04/2021   Procedure: ENTEROSCOPY;  Surgeon: Ladene Artist, MD;  Location: [facility];  Service: Gastroenterology;  Laterality: N/A;   ESOPHAGOGASTRODUODENOSCOPY (EGD) WITH PROPOFOL N/A 12/31/2020   Procedure: ESOPHAGOGASTRODUODENOSCOPY (EGD) WITH PROPOFOL;  Surgeon: Virgel Manifold, MD;  Location: [place];  Service: Endoscopy;  Laterality: N/A;   FOREIGN BODY REMOVAL  02/04/2021   Procedure: FOREIGN BODY REMOVAL;  Surgeon: Ladene Artist, MD;  Location: [facility];  Service: Gastroenterology;;  staples from other gastric procedure   GASTRIC BYPASS     GIVENS CAPSULE STUDY N/A 02/19/2021   Procedure: GIVENS CAPSULE STUDY;  Surgeon: Virgel Manifold, MD;  Location: [facility];  Service: Endoscopy;  Laterality: N/A;   LASIK     MOUTH SURGERY     Tongue Cyst   NOVASURE ABLATION N/A 06/29/2015   Procedure: DILATION AND CURETTAGE, HYSTEROSCOPY, NOVASURE ABLATION;  Surgeon: Malachy Mood, MD;  Location: [facility];  Service: Gynecology;  Laterality: N/A;   TUBAL LIGATION     WISDOM TOOTH EXTRACTION      Family History  Problem Relation Age of Onset   Hemangiomas Mother        benign osseous 2022 MRI   Migraines Mother    Obesity Mother    Hyperlipidemia Mother    Obesity Sister    Obesity Sister    Obesity Brother    Ovarian cancer Maternal Grandmother 26       Cervical/Sqammacell   Breast cancer Maternal Aunt    Neuropathy Maternal Uncle    Breast cancer Maternal Great-grandmother    Skin cancer Maternal Great-grandmother     Allergies  Allergen Reactions   Augmentin [Amoxicillin-Pot Clavulanate]     diarrhea   Codeine Other (See Comments)    Other reaction(s): Other (See Comments)    Hydrocodone-Acetaminophen Nausea And Vomiting    Current Outpatient Medications on File Prior to Visit  Medication Sig Dispense Refill   ASHWAGANDHA PO Take by mouth.     Botulinum Toxin Type A (BOTOX) 200 units SOLR Provider to inject 155 units into the muscles of the head and neck every 3 months. Discard remainder. 1 each 3   cyclobenzaprine (FLEXERIL) 5 MG tablet Take 1 tablet by mouth once daily as needed for migraines. 30 tablet 0   fluconazole (DIFLUCAN) 150 MG tablet Take 1 tab once weekly as preventive for 3 months 15  tablet 0   Fremanezumab-vfrm (AJOVY) 225 MG/1.5ML SOAJ Inject 225 mg into the skin every 30 (thirty) days. 1.5 mL 11   ondansetron (ZOFRAN) 4 MG tablet Take 1 tablet (4 mg total) by mouth every 8 (eight) hours as needed for up to 20 doses for nausea or vomiting. 20 tablet 0   OVER THE COUNTER MEDICATION Maca root Extra strength     OVER THE COUNTER MEDICATION Olly Multi Strain Vaginal     OVER THE COUNTER MEDICATION Olly Multivitamin     SUMAtriptan Succinate Refill 6 MG/0.5ML SOCT Inject 6 mg into the skin at migraine onset, may repeat with 6 mg 2 hours later if migraine persists. 15 mL 0   Vitamin D, Ergocalciferol, (DRISDOL) 1.25 MG (50000 UNIT) CAPS capsule TAKE 1 CAPSULE BY MOUTH EVERY 7 (SEVEN) DAYS. OFFICE VISIT REQUIRED FOR FURTHER REFILLS. 4 capsule 0   Semaglutide-Weight Management (WEGOVY) 0.25 MG/0.5ML SOAJ Inject 0.25 mg into the skin once a week. (Patient not taking: Reported on 11/12/2021) 2 mL 0   No current facility-administered medications on file prior to visit.    BP 126/86    Pulse 72    Temp 98.6 F (37 C) (Oral)    Ht 5' 3.5" (1.613 m)    Wt 258 lb (117 kg)    SpO2 96%    BMI 44.99 kg/m  Objective:   Physical Exam Cardiovascular:     Rate and Rhythm: Normal rate and regular rhythm.  Pulmonary:     Effort: Pulmonary effort is normal.     Breath sounds: Normal breath sounds.  Musculoskeletal:     Cervical back: Neck supple.  Skin:     General: Skin is warm and dry.  Psychiatric:        Mood and Affect: Mood normal.          Assessment & Plan:      This visit occurred during the SARS-CoV-2 public health emergency.  Safety protocols were in place, including screening questions prior to the visit, additional usage of staff PPE, and extensive cleaning of exam room while observing appropriate contact time as indicated for disinfecting solutions.

## 2021-11-12 NOTE — Assessment & Plan Note (Signed)
Will resend [name]. Start at 0.25 mg weekly x 4 weeks, then increase to 0.5 mg weekly thereafter.   She will use a coupon card that she found online. Long discussion regarding the need to work on her diet and start exercising.   Follow up in 2 months.

## 2021-11-12 NOTE — Assessment & Plan Note (Signed)
Uncontrolled. She will be working with her therapist. Do suspect this is contributing to her unhealthy diet and binge eating.  Continue to monitor.

## 2021-11-12 NOTE — Assessment & Plan Note (Signed)
Continued fatigue.  Checking iron levels and CBC today.

## 2021-11-13 LAB — IBC + FERRITIN
Ferritin: 16.8 ng/mL (ref 10.0–291.0)
Iron: 98 ug/dL (ref 42–145)
Saturation Ratios: 23.2 % (ref 20.0–50.0)
TIBC: 422.8 ug/dL (ref 250.0–450.0)
Transferrin: 302 mg/dL (ref 212.0–360.0)

## 2021-11-14 ENCOUNTER — Encounter: Payer: Self-pay | Admitting: Neurology

## 2021-11-14 DIAGNOSIS — Z6841 Body Mass Index (BMI) 40.0 and over, adult: Secondary | ICD-10-CM

## 2021-11-14 DIAGNOSIS — R5382 Chronic fatigue, unspecified: Secondary | ICD-10-CM

## 2021-11-14 DIAGNOSIS — Z9884 Bariatric surgery status: Secondary | ICD-10-CM

## 2021-11-14 NOTE — Telephone Encounter (Signed)
Patient was going to use a coupon card, can you follow-up on this?

## 2021-11-14 NOTE — Telephone Encounter (Signed)
I have not received anything yet °

## 2021-11-16 ENCOUNTER — Other Ambulatory Visit: Payer: Self-pay | Admitting: Primary Care

## 2021-11-16 DIAGNOSIS — E559 Vitamin D deficiency, unspecified: Secondary | ICD-10-CM

## 2021-11-19 ENCOUNTER — Other Ambulatory Visit: Payer: Self-pay | Admitting: Obstetrics and Gynecology

## 2021-11-19 ENCOUNTER — Other Ambulatory Visit: Payer: Self-pay | Admitting: Primary Care

## 2021-11-19 DIAGNOSIS — F411 Generalized anxiety disorder: Secondary | ICD-10-CM

## 2021-11-19 DIAGNOSIS — B3731 Acute candidiasis of vulva and vagina: Secondary | ICD-10-CM

## 2021-11-19 LAB — ESTROGENS, TOTAL: Estrogen: 298.5 pg/mL

## 2021-11-20 ENCOUNTER — Encounter: Payer: Self-pay | Admitting: Obstetrics and Gynecology

## 2021-11-21 MED ORDER — LIRAGLUTIDE 18 MG/3ML ~~LOC~~ SOPN: PEN_INJECTOR | SUBCUTANEOUS | 0 refills | Status: DC

## 2021-11-25 NOTE — Telephone Encounter (Signed)
Spoke with patient and she stated the pharmacy informed her that a PA was needed and she never did look at coupon cards. She would like the PA to be done if possible to see which would be cheaper.

## 2021-11-25 NOTE — Telephone Encounter (Signed)
Submitted on cover my meds can take up to 72 hours for answer.

## 2021-11-26 NOTE — Telephone Encounter (Signed)
Message from cover my meds   Your PA has been resolved, no additional PA is required. For further inquiries please contact the number on the back of the member prescription card. (Message [phone])

## 2021-11-27 NOTE — Telephone Encounter (Signed)
I called [facility] spoke with Jean Pitts she informed me a PA is needed for Botox. I called [facility] [facility] spoke with Jean Pitts, she initiated a PA for botox over the phone. Authorization was approved # [id number] (11/27/2021-05/29/2022). ?

## 2021-12-09 ENCOUNTER — Telehealth: Payer: Self-pay | Admitting: Adult Health

## 2021-12-09 DIAGNOSIS — G43709 Chronic migraine without aura, not intractable, without status migrainosus: Secondary | ICD-10-CM

## 2021-12-09 MED ORDER — BOTOX 200 UNITS IJ SOLR: INTRAMUSCULAR | 3 refills | Status: AC

## 2021-12-09 NOTE — Telephone Encounter (Signed)
Pt needs to go to ER or urgent care  ?

## 2021-12-09 NOTE — Telephone Encounter (Signed)
Pt has called to report she has had this migraine since Thursday of last week.  Pt states she is seeing black spots, nauseated , she is pretty sure she is dehydrated sue to not being able to keep anything down and her urine this morning was dark.  Pt accepted next available appointment and is on wait list. Please call.  ?

## 2021-12-09 NOTE — Telephone Encounter (Signed)
Spoke with pt. She's on her way to the ER/urgent care now. She states the Imitrex injection this time isn't touching her migraine. Botox is in progress. The last note we have is that [facility] was supposed to call the pt. She isn't aware of any calls received. I told her I would send their number to her mychart for her to call when she's better after today's visit. When she gets her shipment date I asked her to give [place] a call and we would get her in for botox asap. She verbalized appreciation. Will leave the April appt for now in case needed. Her questions were answered.  ?

## 2021-12-12 ENCOUNTER — Other Ambulatory Visit: Payer: Self-pay

## 2021-12-12 ENCOUNTER — Emergency Department: Payer: BLUE CROSS/BLUE SHIELD

## 2021-12-12 ENCOUNTER — Encounter: Payer: Self-pay | Admitting: Emergency Medicine

## 2021-12-12 ENCOUNTER — Emergency Department
Admission: EM | Admit: 2021-12-12 | Discharge: 2021-12-12 | Disposition: A | Discharge status: Home / Self Care | Payer: BLUE CROSS/BLUE SHIELD | Attending: Emergency Medicine | Admitting: Emergency Medicine

## 2021-12-12 ENCOUNTER — Ambulatory Visit: Payer: BLUE CROSS/BLUE SHIELD | Admitting: Adult Health

## 2021-12-12 DIAGNOSIS — R1032 Left lower quadrant pain: Secondary | ICD-10-CM | POA: Diagnosis not present

## 2021-12-12 DIAGNOSIS — R102 Pelvic and perineal pain: Secondary | ICD-10-CM | POA: Diagnosis not present

## 2021-12-12 DIAGNOSIS — R1110 Vomiting, unspecified: Secondary | ICD-10-CM | POA: Insufficient documentation

## 2021-12-12 LAB — URINALYSIS, ROUTINE W REFLEX MICROSCOPIC
Bacteria, UA: NONE SEEN
Bilirubin Urine: NEGATIVE
Glucose, UA: NEGATIVE mg/dL
Ketones, ur: NEGATIVE mg/dL
Nitrite: NEGATIVE
Protein, ur: NEGATIVE mg/dL
Specific Gravity, Urine: 1.018 (ref 1.005–1.030)
pH: 5 (ref 5.0–8.0)

## 2021-12-12 LAB — CBC WITH DIFFERENTIAL/PLATELET
Abs Immature Granulocytes: 0.03 10*3/uL (ref 0.00–0.07)
Basophils Absolute: 0.1 10*3/uL (ref 0.0–0.1)
Basophils Relative: 1 %
Eosinophils Absolute: 0.1 10*3/uL (ref 0.0–0.5)
Eosinophils Relative: 1 %
HCT: 36.2 % (ref 36.0–46.0)
Hemoglobin: 11.9 g/dL — ABNORMAL LOW (ref 12.0–15.0)
Immature Granulocytes: 0 %
Lymphocytes Relative: 37 %
Lymphs Abs: 3.3 10*3/uL (ref 0.7–4.0)
MCH: 30.5 pg (ref 26.0–34.0)
MCHC: 32.9 g/dL (ref 30.0–36.0)
MCV: 92.8 fL (ref 80.0–100.0)
Monocytes Absolute: 0.5 10*3/uL (ref 0.1–1.0)
Monocytes Relative: 6 %
Neutro Abs: 4.9 10*3/uL (ref 1.7–7.7)
Neutrophils Relative %: 55 %
Platelets: 337 10*3/uL (ref 150–400)
RBC: 3.9 MIL/uL (ref 3.87–5.11)
RDW: 12.8 % (ref 11.5–15.5)
WBC: 8.9 10*3/uL (ref 4.0–10.5)
nRBC: 0 % (ref 0.0–0.2)

## 2021-12-12 LAB — COMPREHENSIVE METABOLIC PANEL
ALT: 14 U/L (ref 0–44)
AST: 18 U/L (ref 15–41)
Albumin: 3.5 g/dL (ref 3.5–5.0)
Alkaline Phosphatase: 78 U/L (ref 38–126)
Anion gap: 8 (ref 5–15)
BUN: 14 mg/dL (ref 6–20)
CO2: 28 mmol/L (ref 22–32)
Calcium: 8.4 mg/dL — ABNORMAL LOW (ref 8.9–10.3)
Chloride: 102 mmol/L (ref 98–111)
Creatinine, Ser: 0.89 mg/dL (ref 0.44–1.00)
GFR, Estimated: >60 mL/min (ref >60)
Glucose, Bld: 90 mg/dL (ref 70–99)
Potassium: 3.6 mmol/L (ref 3.5–5.1)
Sodium: 138 mmol/L (ref 135–145)
Total Bilirubin: 0.5 mg/dL (ref 0.3–1.2)
Total Protein: 6.9 g/dL (ref 6.5–8.1)

## 2021-12-12 LAB — POC URINE PREG, ED: Preg Test, Ur: NEGATIVE

## 2021-12-12 LAB — LIPASE, BLOOD: Lipase: 46 U/L (ref 11–51)

## 2021-12-12 IMAGING — CT CT ABD-PELV W/ CM
2 of 4 series · 15 of 46 positions shown, 17 images · IV contrast (APPLIED)
Comparison: 12/21/2020

CLINICAL DATA: 35-year-old female with history Roux-en-Y gastric
bypass and appendectomy acute left-sided abdominal pain. Evaluate
for small bowel obstruction.

EXAM:
CT ABDOMEN AND PELVIS WITH CONTRAST
TECHNIQUE: Multidetector CT imaging of the abdomen and pelvis was performed
using the standard protocol following bolus administration of
intravenous contrast.

[Series 2: routine abd/pel with · axial · 0.85mm/px · z∈[-914,-489]mm · 12 of 95 slices shown, 14 images]
[im 5/95  soft-tissue]
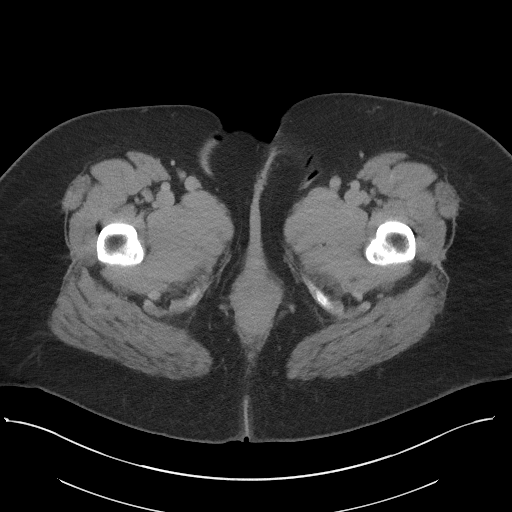
[im 5/95  bone]
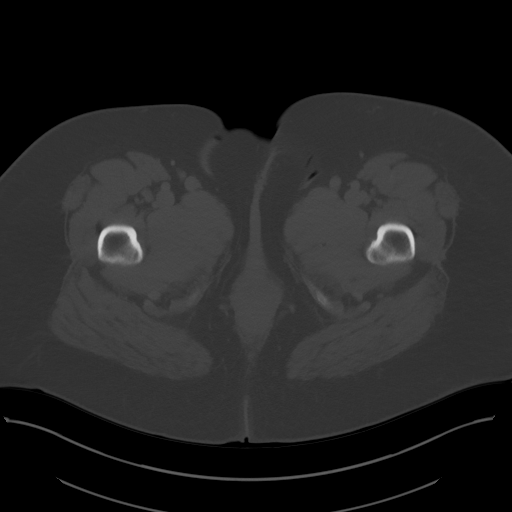
[im 13/95  soft-tissue]
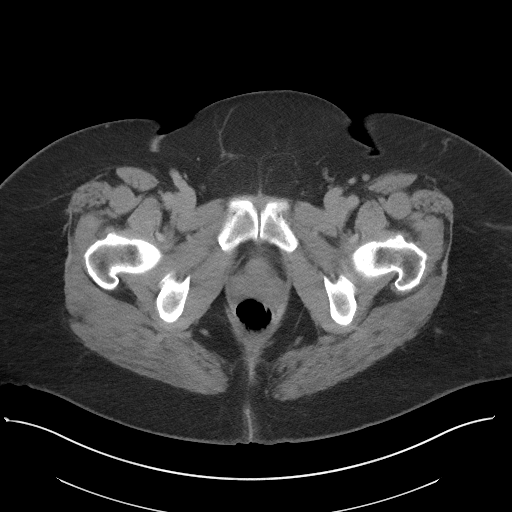
[im 21/95  soft-tissue]
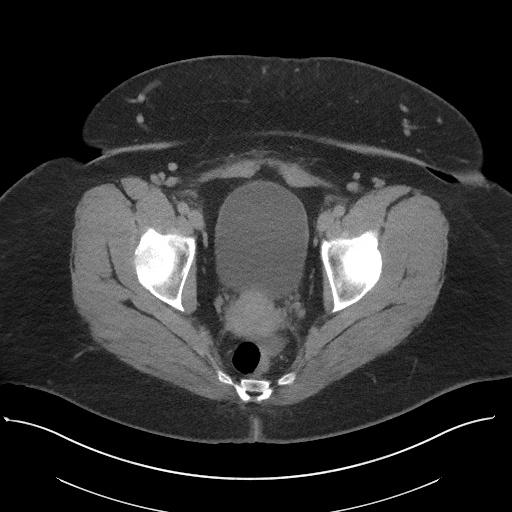
[im 29/95  soft-tissue]
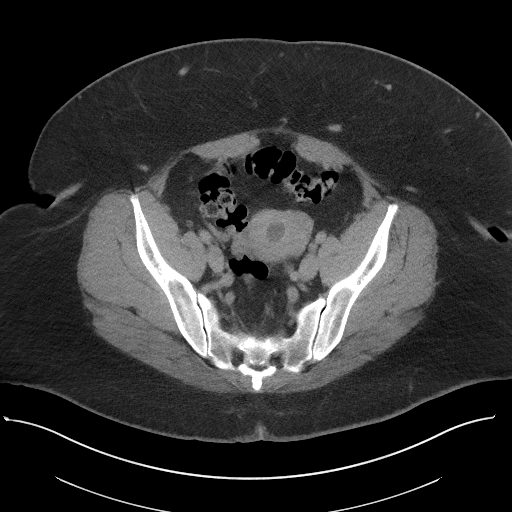
[im 37/95  soft-tissue]
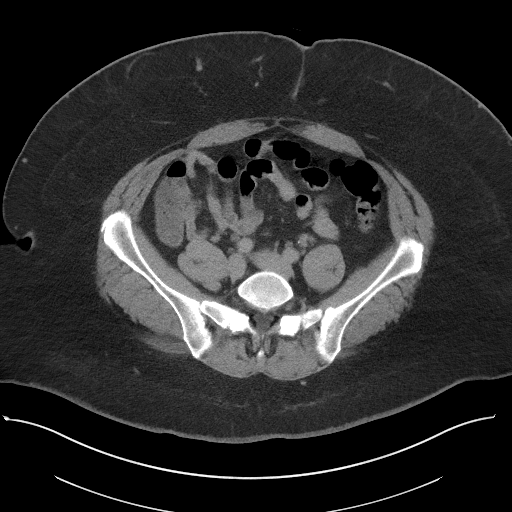
[im 45/95  soft-tissue]
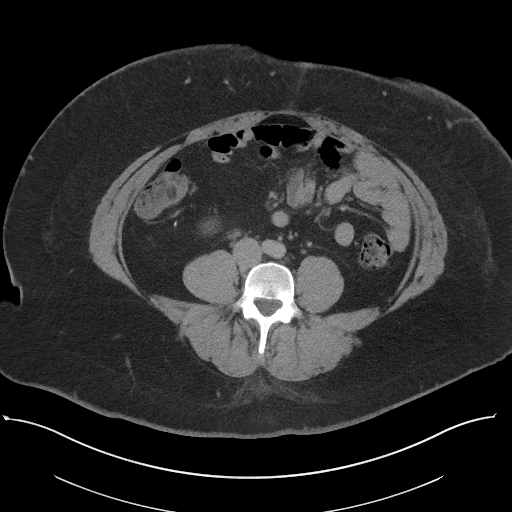
[im 50/95  soft-tissue]
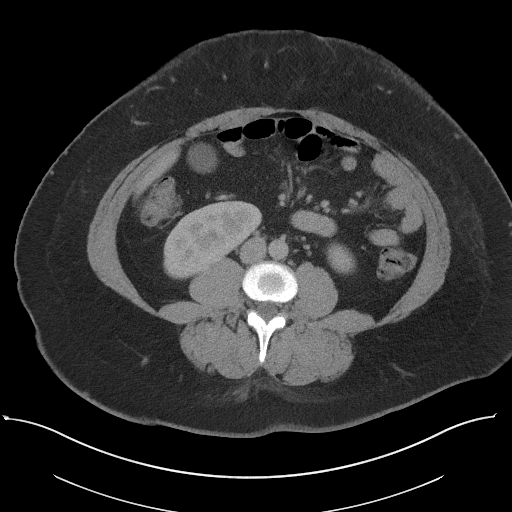
[im 58/95  soft-tissue]
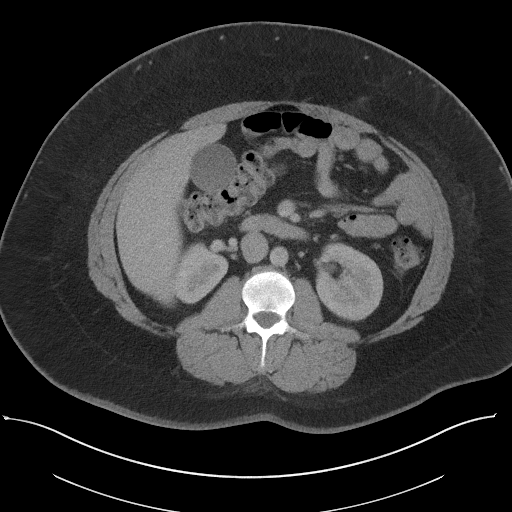
[im 66/95  soft-tissue]
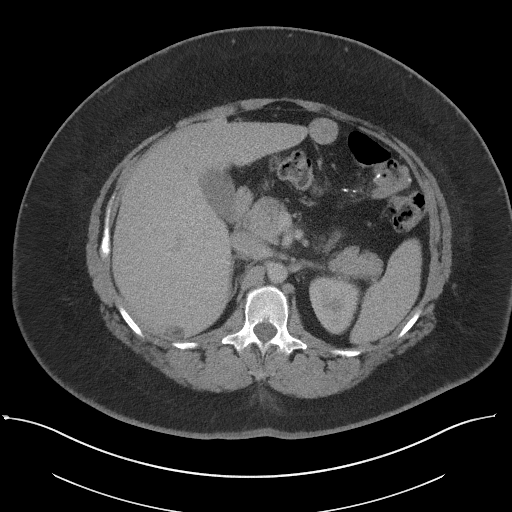
[im 66/95  bone]
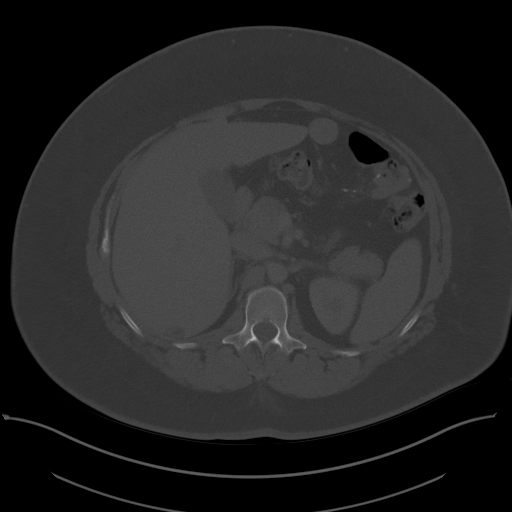
[im 74/95  soft-tissue]
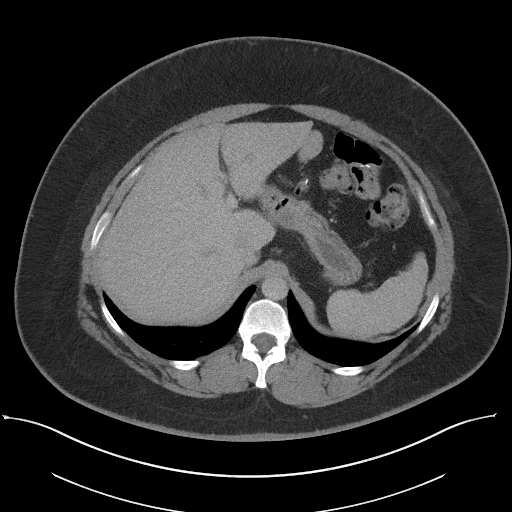
[im 82/95  soft-tissue]
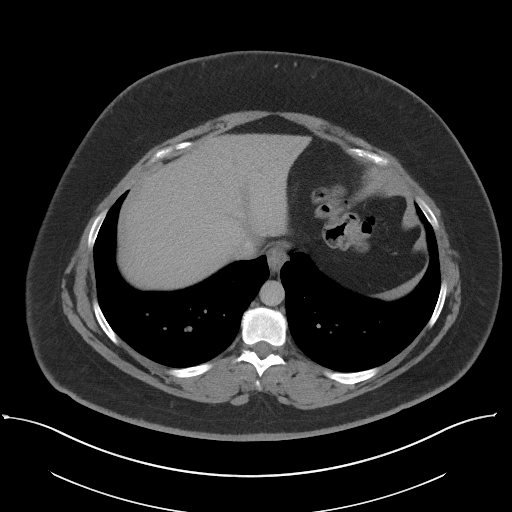
[im 90/95  soft-tissue]
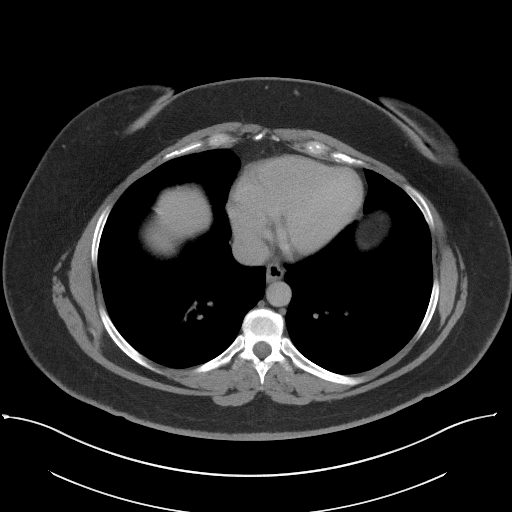

[Series 5: coronal st · coronal · 0.69mm/px · 3 of 96 slices shown]
[im 32/96  soft-tissue]
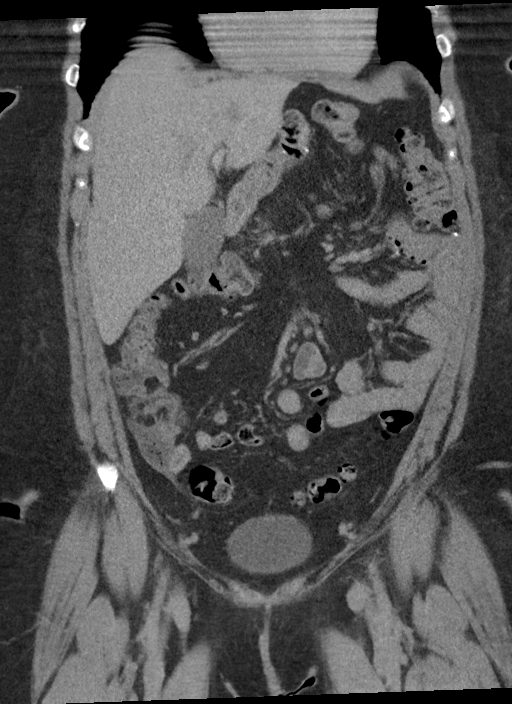
[im 43/96  soft-tissue]
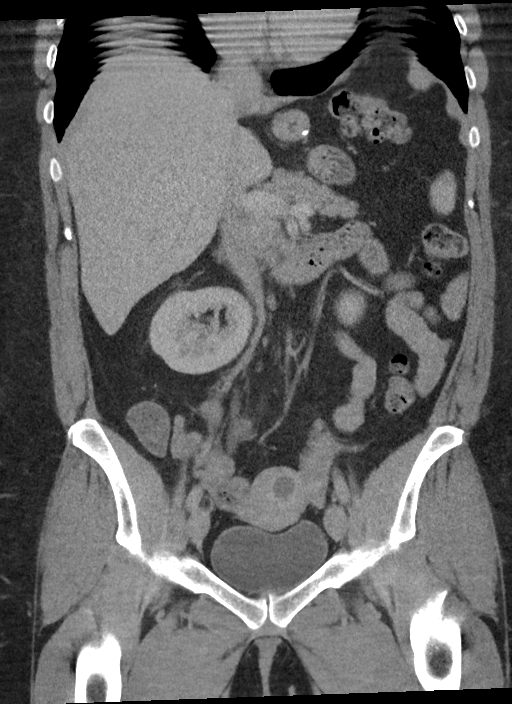
[im 53/96  soft-tissue]
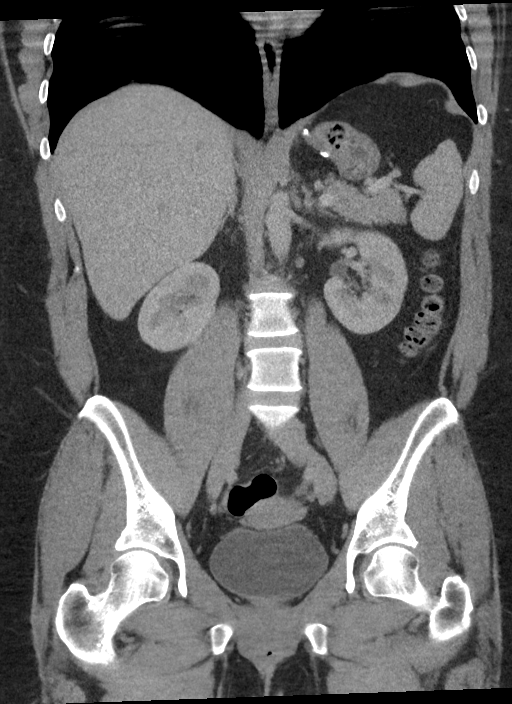

[15 of 46 positions shown; findings below may reference images not displayed]

RADIATION DOSE REDUCTION: This exam was performed according to the
departmental dose-optimization program which includes automated
exposure control, adjustment of the mA and/or kV according to
patient size and/or use of iterative reconstruction technique.

CONTRAST:  100mL OMNIPAQUE IOHEXOL 300 MG/ML  SOLN
FINDINGS: Lower chest: No acute abnormality.

Hepatobiliary: Minimal interval enlargement of previously visualized
hypoattenuating subcapsular focus in the posterior right lobe of the
liver measuring approximately 1.7 x 1.0 cm, previously 1.4x 1.0 cm,
again favored represent simple hepatic cysts. The liver is otherwise
normal in size, contour, and attenuation. The gallbladder is present
and unremarkable. No intra or extrahepatic biliary ductal dilation.

Pancreas: Unremarkable. No pancreatic ductal dilatation or
surrounding inflammatory changes.

Spleen: Normal in size without focal abnormality.

Adrenals/Urinary Tract: Adrenal glands are unremarkable. Kidneys are
normal, without renal calculi, focal lesion, or hydronephrosis.
Bladder is unremarkable.

Stomach/Bowel: Postsurgical changes after Roux-en-Y gastric bypass
without complicating features. Appendix is surgically absent. No
evidence of bowel wall thickening, distention, or inflammatory
changes.

Vascular/Lymphatic: No significant vascular findings are present. No
enlarged abdominal or pelvic lymph nodes.

Reproductive: Unchanged 1.6 cm hypoattenuating uterine fibroid in
the fundus. The uterus is otherwise within limits. No adnexal
masses.

Other: Tiny fat containing umbilical hernia. No abdominopelvic
ascites.

Musculoskeletal: No acute or significant osseous findings.
IMPRESSION: 1. No acute abdominopelvic abnormality.
2. Postsurgical changes after Roux-en-Y gastric bypass and
appendectomy without complicating features.
3. Similar appearing 1.6 cm fundal uterine fibroid.
4. Unchanged tiny fat containing umbilical hernia.

## 2021-12-12 IMAGING — US US ART/VEN ABD/PELV/SCROTUM DOPPLER LTD
1 series · 13 of 25 positions shown · non-contrast
Comparison: CT scan from same date, prior CT scan 12/21/2020 and
prior ultrasound 02/15/2019.

CLINICAL DATA: Left-sided pelvic pain since yesterday. History of
endometrial ablation and 2 cesarean sections.



[Series 1: us pelvic doppler (torsion right/o or mass arteria · arterial · 13 of 69 slices shown]
[im 1/69]
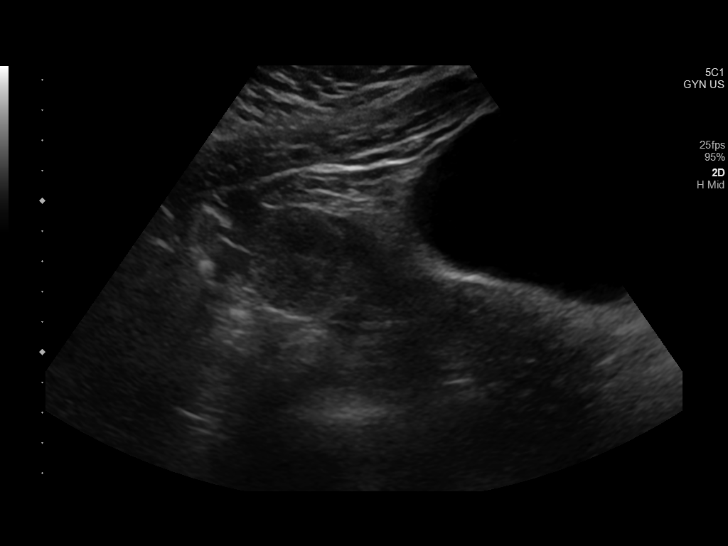
[im 6/69]
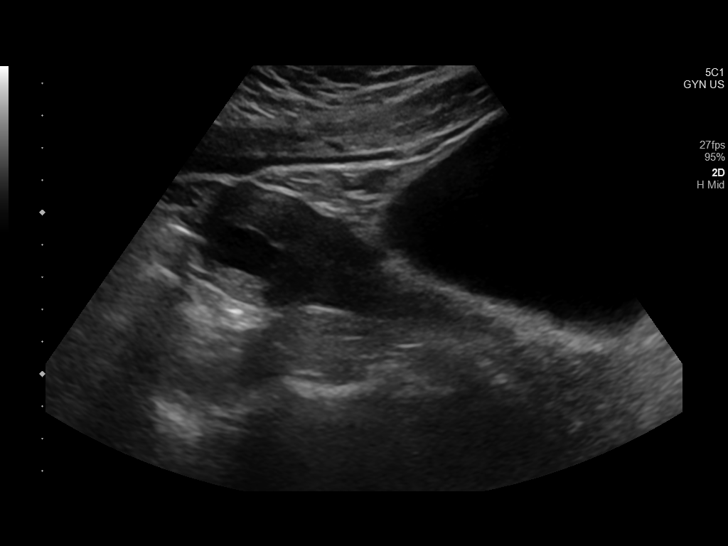
[im 12/69]
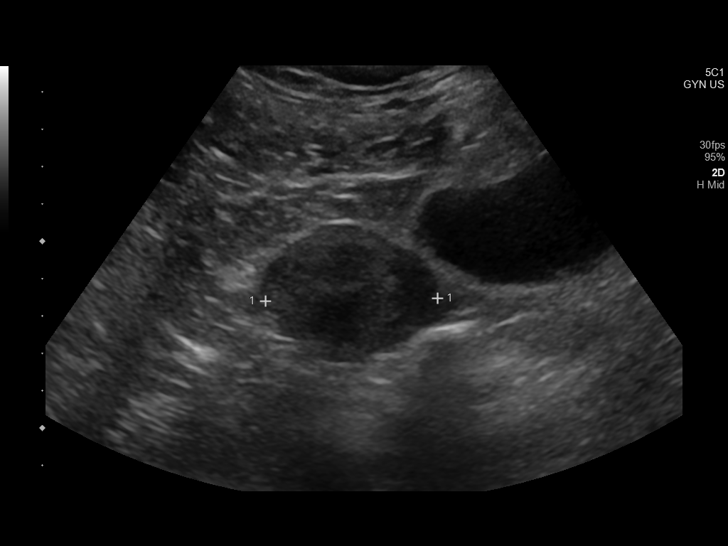
[im 18/69]
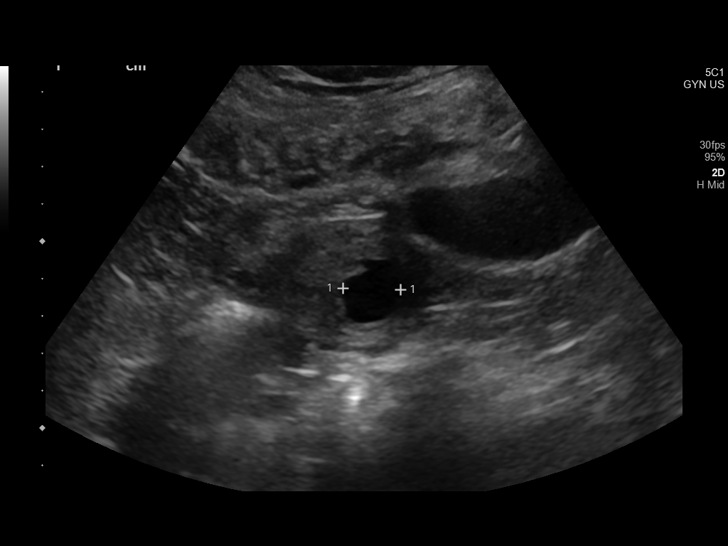
[im 23/69]
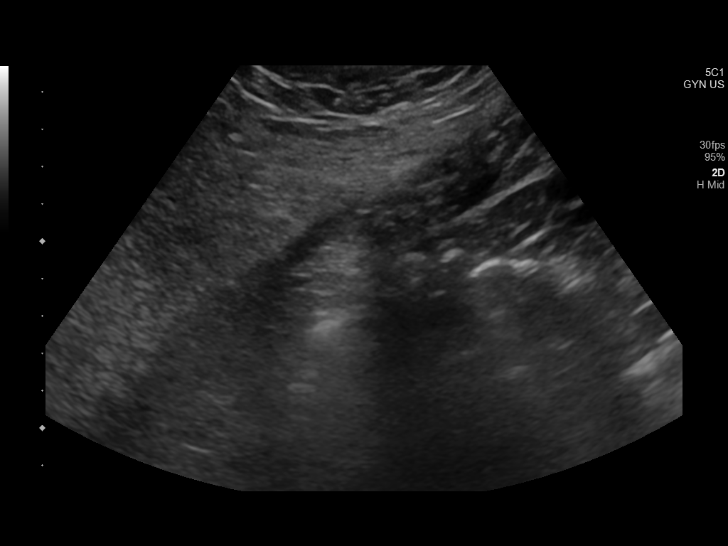
[im 29/69]
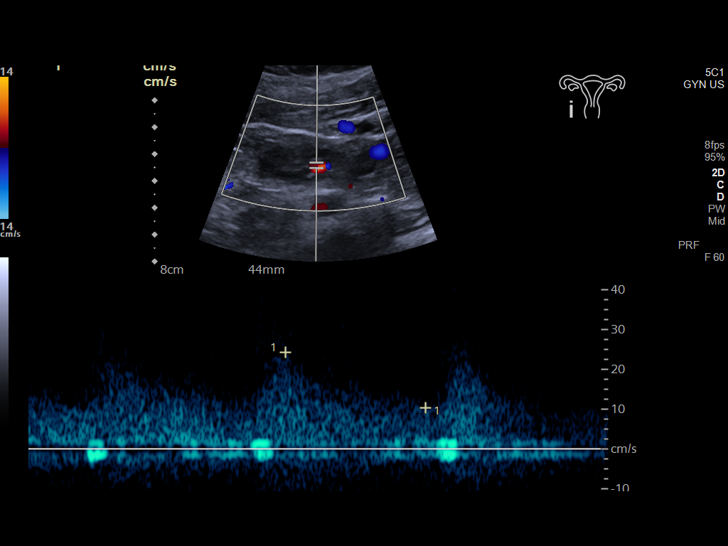
[im 35/69]
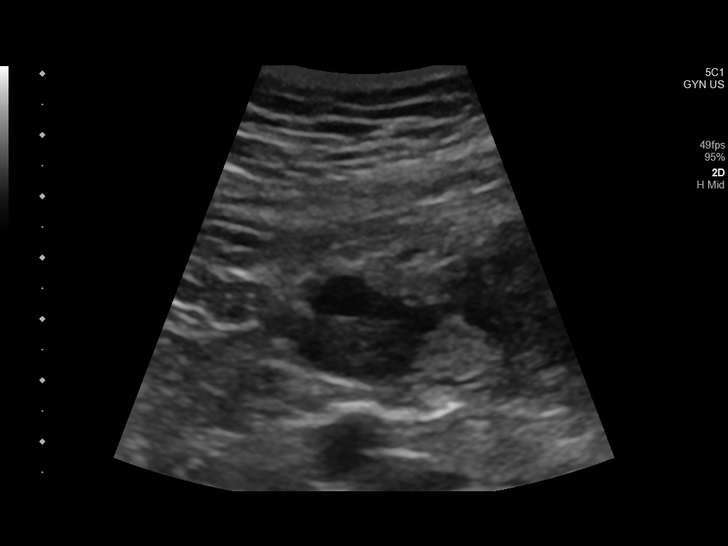
[im 40/69]
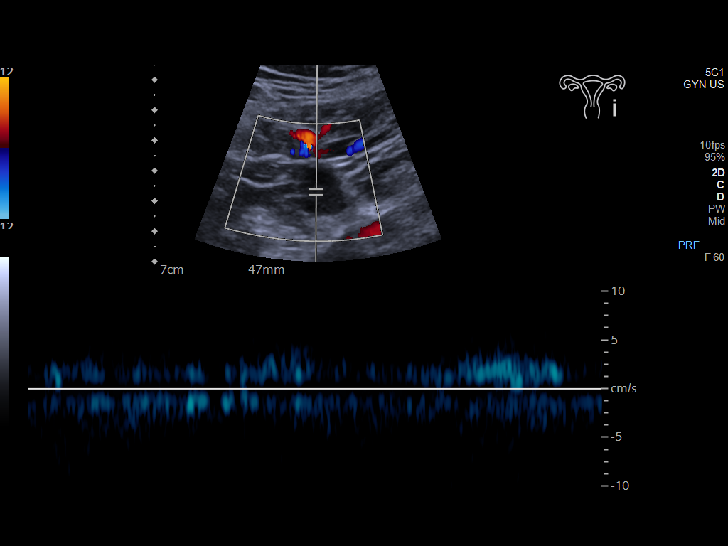
[im 46/69]
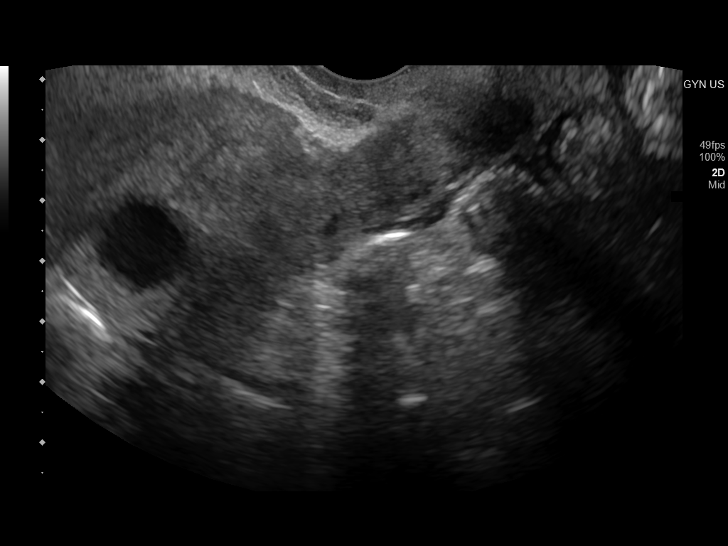
[im 52/69]
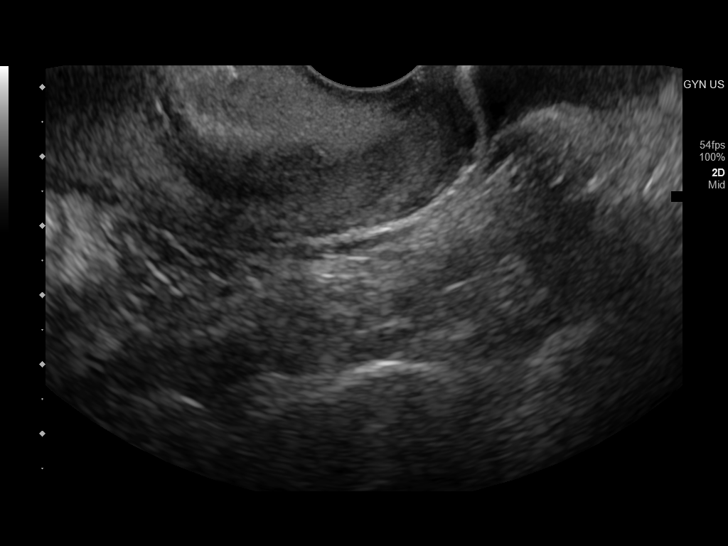
[im 57/69]
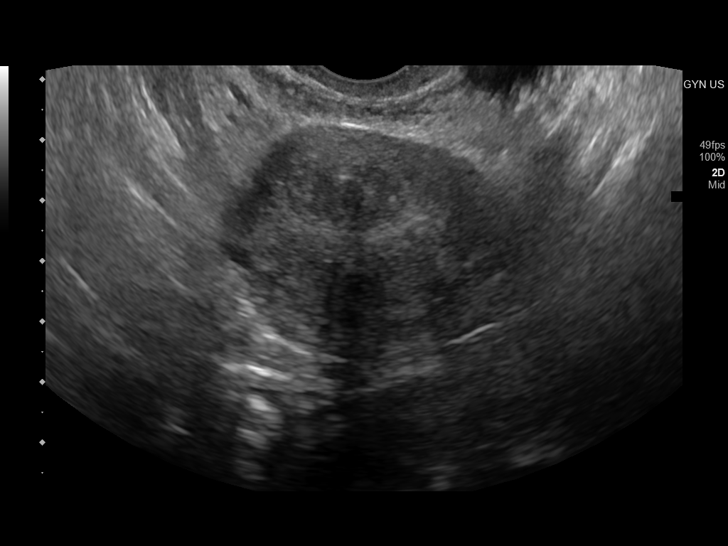
[im 63/69]
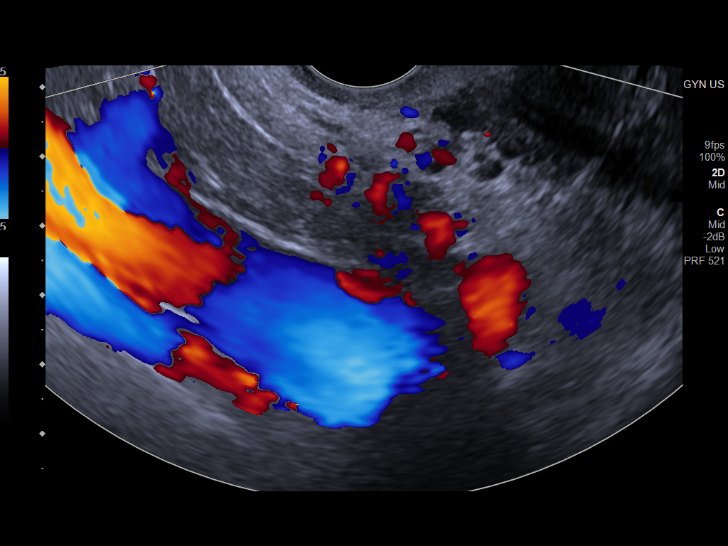
[im 69/69]
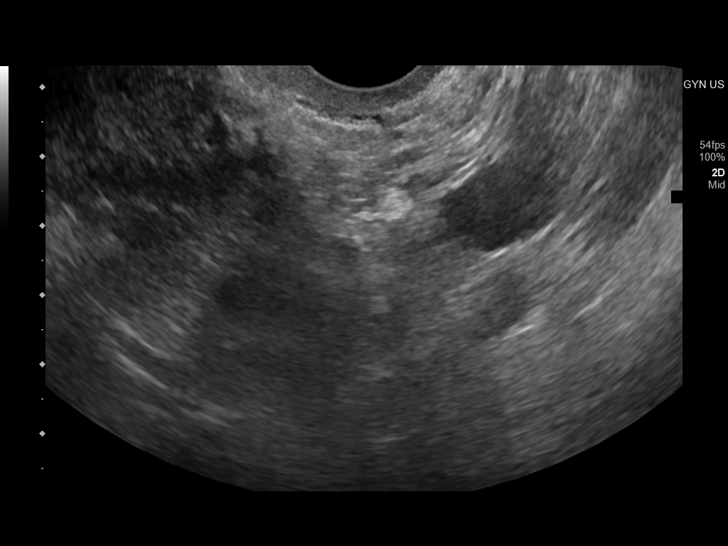

[13 of 25 positions shown; findings below may reference images not displayed]

FINDINGS: Uterus

Measurements: 9.3 x 4.0 x 4.7 cm = volume: 91.1 mL. There is a
cystic appearing lesion in the posterior myometrium in the fundal
area measuring a maximum of 1.5 x 1.5 cm. This demonstrates enhanced
through transmission. This was also present on the prior ultrasound
0303 but a. partially cystic and partially solid. This is most
likely a degenerated/liquified fibroid.

Endometrium

Thickness: 7.3 mm.  No focal abnormality visualized.

Right ovary

Measurements: 3.3 x 1.4 x 1.7 cm = volume: 4.5 mL. No adnexal mass
or cyst.

Left ovary

Measurements: 1.7 x 1.3 x 1.5 cm = volume: 1.9 mL. Normal
appearance/no adnexal mass.

Pulsed Doppler evaluation of both ovaries demonstrates normal
low-resistance arterial and venous waveforms.

Other findings

Trace free pelvic fluid.
IMPRESSION: 1. Cystic appearing lesion in the posterior myometrium in the fundal
region measuring 1.5 x 1.5 cm. This is most likely a
degenerated/liquified fibroid.
2. Normal endometrium.
3. Normal ovaries.

## 2021-12-12 MED ORDER — IOHEXOL 300 MG/ML  SOLN
100.0000 mL | Freq: Once | INTRAMUSCULAR | Status: AC | PRN
  Administered 2021-12-12: 100 mL via INTRAVENOUS

## 2021-12-12 MED ORDER — MORPHINE SULFATE (PF) 4 MG/ML IV SOLN
6.0000 mg | Freq: Once | INTRAVENOUS | Status: AC
  Administered 2021-12-12: 6 mg via INTRAVENOUS
  Filled 2021-12-12: qty 2

## 2021-12-12 MED ORDER — METOCLOPRAMIDE HCL 5 MG/ML IJ SOLN
10.0000 mg | INTRAMUSCULAR | Status: AC
  Administered 2021-12-12: 10 mg via INTRAVENOUS
  Filled 2021-12-12: qty 2

## 2021-12-12 MED ORDER — METOCLOPRAMIDE HCL 10 MG PO TABS: 10.0000 mg | ORAL_TABLET | Freq: Four times a day (QID) | ORAL | 0 refills | Status: DC | PRN

## 2021-12-12 MED ORDER — ONDANSETRON HCL 4 MG/2ML IJ SOLN
4.0000 mg | Freq: Once | INTRAMUSCULAR | Status: AC
  Administered 2021-12-12: 4 mg via INTRAVENOUS
  Filled 2021-12-12 (×2): qty 2

## 2021-12-12 MED ORDER — LACTATED RINGERS IV BOLUS
1000.0000 mL | Freq: Once | INTRAVENOUS | Status: AC
Start: 2021-12-12 — End: 2021-12-12
  Administered 2021-12-12: 1000 mL via INTRAVENOUS

## 2021-12-12 MED ORDER — FAMOTIDINE IN NACL 20-0.9 MG/50ML-% IV SOLN
20.0000 mg | Freq: Once | INTRAVENOUS | Status: AC
  Administered 2021-12-12: 20 mg via INTRAVENOUS
  Filled 2021-12-12: qty 50

## 2021-12-12 MED ORDER — ALUM & MAG HYDROXIDE-SIMETH 200-200-20 MG/5ML PO SUSP
30.0000 mL | Freq: Once | ORAL | Status: AC
  Administered 2021-12-12: 30 mL via ORAL
  Filled 2021-12-12: qty 30

## 2021-12-12 MED ORDER — FAMOTIDINE 20 MG PO TABS: 20.0000 mg | ORAL_TABLET | Freq: Two times a day (BID) | ORAL | 0 refills | Status: DC

## 2021-12-12 NOTE — ED Provider Notes (Signed)
? ?[facility] ?Provider Note ? ? ? Event Date/Time  ? First MD Initiated Contact with Patient 12/12/21 0555   ?  (approximate) ? ? ?History  ? ?Abdominal Pain ? ? ?HPI ? ?Jean Pitts is a 36 y.o. female who presents to the ED for evaluation of Abdominal Pain ?  ?Reviewed PCP visit from 2/14.  Morbid obesity, PCOS, HLD and s/p Roux-en-Y gastric bypass. ? ?Patient presents to the ED for evaluation of acute abdominal pain over the past 24 hours.  During the daytime yesterday she reports intermittent LLQ pain.  She reports the pain worsened earlier this morning, woke her from sleep and due to the severity of the 10/10 pain that caused her to vomit a couple times.  Nonbloody nonbilious emesis. ? ?She reports a "normal" bowel movement yesterday without diarrhea, melena or hematochezia.  Denies dysuria, hematuria or frequency.  Denies new vaginal discharge or bleeding. ?Only reporting abdominal pain. ? ? ?Physical Exam  ? ?Triage Vital Signs: ?ED Triage Vitals  ?Enc Vitals Group  ?   BP 12/12/21 0547 (!) 130/94  ?   Pulse Rate 12/12/21 0547 74  ?   Resp 12/12/21 0547 18  ?   Temp 12/12/21 0547 97.7 ?F (36.5 ?C)  ?   Temp Source 12/12/21 0547 Oral  ?   SpO2 12/12/21 0547 97 %  ?   Weight 12/12/21 0542 260 lb (117.9 kg)  ?   Height 12/12/21 0542 '5\' 3"'$  (1.6 m)  ?   Head Circumference --   ?   Peak Flow --   ?   Pain Score 12/12/21 0542 10  ?   Pain Loc --   ?   Pain Edu? --   ?   Excl. in [place]? --   ? ? ?Most recent vital signs: ?Vitals:  ? 12/12/21 0547  ?BP: (!) 130/94  ?Pulse: 74  ?Resp: 18  ?Temp: 97.7 ?F (36.5 ?C)  ?SpO2: 97%  ? ? ?General: Awake, no distress.  Obese.  Appears uncomfortable. ?CV:  Good peripheral perfusion.  ?Resp:  Normal effort.  ?Abd:  No distention.  Mild tenderness to palpation to LLQ abdomen.  No peritoneal features or guarding. ?MSK:  No deformity noted.  ?Neuro:  No focal deficits appreciated. ?Other:   ? ? ?ED Results / Procedures / Treatments  ? ?Labs ?(all labs ordered  are listed, but only abnormal results are displayed) ?Labs Reviewed  ?CBC WITH DIFFERENTIAL/PLATELET - Abnormal; Notable for the following components:  ?    Result Value  ? Hemoglobin 11.9 (*)   ? All other components within normal limits  ?URINALYSIS, ROUTINE W REFLEX MICROSCOPIC  ?COMPREHENSIVE METABOLIC PANEL  ?LIPASE, BLOOD  ?POC URINE PREG, ED  ? ? ?EKG ? ? ?RADIOLOGY ? ? ?Official radiology report(s): ?No results found. ? ?PROCEDURES and INTERVENTIONS: ? ?Procedures ? ?Medications  ?lactated ringers bolus 1,000 mL (1,000 mLs Intravenous New Bag/Given 12/12/21 7782)  ?ondansetron [facility]) injection 4 mg (4 mg Intravenous Given 12/12/21 0617)  ?morphine (PF) 4 MG/ML injection 6 mg (6 mg Intravenous Given 12/12/21 0617)  ? ? ? ?IMPRESSION / MDM / ASSESSMENT AND PLAN / ED COURSE  ?I reviewed the triage vital signs and the nursing notes. ? ?Obese 36 year old female s/p Roux-en-Y gastric bypass presents to the ED with acute abdominal pain and emesis.  Reassuring vital signs and examination.  She has some mild tenderness to the LLQ, but no guarding or peritoneal features.  CBC is normal and  awaiting remainder of blood work at the time of signout.  Due to her surgical history, CT scan is ordered to evaluate for SBO.  May require a pelvic ultrasound after this to evaluate for ovarian cystic pathology. ? ?  ? ? ?FINAL CLINICAL IMPRESSION(S) / ED DIAGNOSES  ? ?Final diagnoses:  ?None  ? ? ? ?Rx / DC Orders  ? ?ED Discharge Orders   ? ? None  ? ?  ? ? ? ?Note:  This document was prepared using Dragon voice recognition software and may include unintentional dictation errors. ?  ?Vladimir Crofts, MD ?12/12/21 [phone] ? ?

## 2021-12-12 NOTE — ED Triage Notes (Signed)
Patient ambulatory to triage with steady gait, without difficulty or distress noted; pt reports since yesterday having left lower abd pain accomp by N/V ?

## 2021-12-12 NOTE — ED Notes (Signed)
Unable to provide urine specimen at this time.  

## 2021-12-12 NOTE — ED Provider Notes (Signed)
Procedures ? ?  ? ?----------------------------------------- ?11:20 AM on 12/12/2021 ?----------------------------------------- ?CT scan viewed and interpreted by me, no acute findings such as bowel obstruction or free air.  Radiology report reviewed. ? ?On reassessment, patient was still having persistent left upper quadrant abdominal pain with some mild tenderness.  Most likely gastritis of her pouch or remnant gastric body from her Roux-en-Y surgery. ? ?Ultrasound of the pelvis is negative for signs of torsion.  Overall unremarkable except for finding of a uterine fibroid which I think is incidental. ? ?Patient is feeling better after antacids.  We will continue her on Pepcid and Reglan.  She is not requiring admission due to reassuring work-up and improving symptoms. ? ? ?  ?Carrie Mew, MD ?12/12/21 1121 ? ?

## 2021-12-13 ENCOUNTER — Ambulatory Visit: Payer: BLUE CROSS/BLUE SHIELD | Admitting: Licensed Practical Nurse

## 2021-12-13 ENCOUNTER — Encounter: Payer: Self-pay | Admitting: Licensed Practical Nurse

## 2021-12-13 ENCOUNTER — Other Ambulatory Visit: Payer: Self-pay | Admitting: Obstetrics & Gynecology

## 2021-12-13 VITALS — BP 118/80 | Ht 63.0 in | Wt 266.0 lb

## 2021-12-13 DIAGNOSIS — R102 Pelvic and perineal pain: Secondary | ICD-10-CM | POA: Diagnosis not present

## 2021-12-13 DIAGNOSIS — R1032 Left lower quadrant pain: Secondary | ICD-10-CM | POA: Diagnosis not present

## 2021-12-13 MED ORDER — OXYCODONE HCL 5 MG PO TABS: 10.0000 mg | ORAL_TABLET | Freq: Four times a day (QID) | ORAL | 0 refills | Status: DC | PRN

## 2021-12-13 MED ORDER — OXYCODONE-ACETAMINOPHEN 5-325 MG PO TABS: 1.0000 | ORAL_TABLET | ORAL | 0 refills | Status: DC | PRN

## 2021-12-13 NOTE — Progress Notes (Signed)
? ? ? ?Obstetrics & Gynecology Office Visit  ? ?Chief Complaint:  ?Chief Complaint  ?Patient presents with  ? Follow-up  ? ? ?History of Present Illness: Was seen in the ED yesterday for LLQ pain, she was told to follow up with her provider.  ?Has had LLQ pain since Wednesday, The pain is always there but sometimes it is shooting or stabbing,  the pain is so severe that she has vomited, it has also woken her form her sleep. She was given Morphine in the ED, that helped for a short time. The pain is still there today. She has been using Tylenol, she is not able to take NSAIDs, will minimal relief.  ?Bowel movements have bene "normal".  ? ?Pelvic US shows ?1. Cystic appearing lesion in the posterior myometrium in the fundal ?region measuring 1.5 x 1.5 cm. This is most likely a ?degenerated/liquified fibroid. ?2. Normal endometrium. ?3. Normal ovaries. ?  ?CT scan shows: ?1. No acute abdominopelvic abnormality. ?2. Postsurgical changes after Roux-en-Y gastric bypass and ?appendectomy without complicating features. ?3. Similar appearing 1.6 cm fundal uterine fibroid. ?4. Unchanged tiny fat containing umbilical hernia. ?  ? ?Review of Systems: denies fever or changes in bowel habits  ? ?Past Medical History:  ?Past Medical History:  ?Diagnosis Date  ? Abnormal glucose tolerance test in pregnancy, antepartum 06/04/2012  ? Formatting of this note might be different from the original. Needs 3h GTT  ? Anemia   ? B12 deficiency   ? BRCA negative 10/2021  ? MyRisk neg  ? Family history of adverse reaction to anesthesia   ? MATERNAL AUNT-PT UNAWARE OF WHAT HAPPENED WITH ANESTHESIA BUT STATES HER AUNT HAS HAD PROBLEMS IN PAST   ? Family history of breast cancer 10/2021  ? IBIS=12.9%/riskscore=17.1%  ? Family history of ovarian cancer   ? Fecal occult blood test positive   ? Genital herpes   ? GERD (gastroesophageal reflux disease)   ? H/O cesarean section complicating pregnancy 81/19/1478  ? 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  Hair thinning 05/12/2018  ? Headache   ? MIGRAINE  ? Heartburn 10/26/2018  ? Hyperlipidemia   ? Hypertension   ? Migraine   ? Obesity   ? PONV (postoperative nausea and vomiting)   ? Syncope 04/2015  ? Vitamin D deficiency   ? Yeast vaginitis 02/23/2019  ? Candida albicans on culture  ? ? ?Past Surgical History:  ?Past Surgical History:  ?Procedure Laterality Date  ? APPENDECTOMY    ? BUNIONECTOMY    ? CESAREAN SECTION    ? X2  ? COLONOSCOPY WITH PROPOFOL N/A 12/31/2020  ? Procedure: COLONOSCOPY WITH PROPOFOL. eleview and spot ink tattoo used.;  Surgeon: Virgel Manifold, MD;  Location: [place];  Service: Endoscopy;  Laterality: N/A;  ? DIAGNOSTIC LAPAROSCOPY    ? ENDOMETRIAL BIOPSY    ? ENTEROSCOPY N/A 01/23/2021  ? Procedure: ENTEROSCOPY;  Surgeon: Virgel Manifold, MD;  Location: [facility] ENDOSCOPY;  Service: Endoscopy;  Laterality: N/A;  ? ENTEROSCOPY N/A 02/04/2021  ? Procedure: ENTEROSCOPY;  Surgeon: Ladene Artist, MD;  Location: [facility] ENDOS[facility];  Service: Gastroenterology;  Laterality: N/A;  ? ESOPHAGOGASTRODUODENOSCOPY (EGD) WITH PROPOFOL N/A 12/31/2020  ? Procedure: ESOPHAGOGASTRODUODENOSCOPY (EGD) WITH PROPOFOL;  Surgeon: Virgel Manifold, MD;  Location: [place];  Service: Endoscopy;  Laterality: N/A;  ? FOREIGN BODY REMOVAL  02/04/2021  ? Procedure: FOREIGN BODY REMOVAL;  Surgeon: Ladene Artist, MD;  Location: [facility] ENDOSCOPY;  Service: Gastroenterology;;  staples  from other gastric procedure  ? GASTRIC BYPASS    ? GIVENS CAPSULE STUDY N/A 02/19/2021  ? Procedure: GIVENS CAPSULE STUDY;  Surgeon: Virgel Manifold, MD;  Location: [facility] ENDOSCOPY;  Service: Endoscopy;  Laterality: N/A;  ? LASIK    ? MOUTH SURGERY    ? Tongue Cyst  ? [place] N/A 06/29/2015  ? Procedure: DILATION AND CURETTAGE, HYSTEROSCOPY, NOVASURE ABLATION;  Surgeon: Malachy Mood, MD;  Location: [facility] ORS;  Service: Gynecology;  Laterality: N/A;  ? TUBAL LIGATION    ? WISDOM TOOTH EXTRACTION     ? ? ?Gynecologic History: No LMP recorded. Patient has had an ablation. ? ?Obstetric History: [id number] ? ?Family History:  ?Family History  ?Problem Relation Age of Onset  ? Hemangiomas Mother   ?     benign osseous 2022 MRI  ? Migraines Mother   ? Obesity Mother   ? Hyperlipidemia Mother   ? Obesity Sister   ? Obesity Sister   ? Obesity Brother   ? Ovarian cancer Maternal Grandmother 26  ?     Cervical/[place]  ? Breast cancer Maternal Aunt   ? Neuropathy Maternal Uncle   ? Breast cancer Maternal Great-grandmother   ? Skin cancer Maternal Great-grandmother   ? ? ?Social History:  ?Social History  ? ?Socioeconomic History  ? Marital status: Married  ?  Spouse name: Not on file  ? Number of children: 2  ? Years of education: Not on file  ? Highest education level: Some college, no degree  ?Occupational History  ? Occupation: Multimedia programmer   ?Tobacco Use  ? Smoking status: Never  ? Smokeless tobacco: Never  ?Vaping Use  ? Vaping Use: Never used  ?Substance and Sexual Activity  ? Alcohol use: Not Currently  ?  Comment: RARE  ? Drug use: No  ? Sexual activity: Yes  ?  Partners: Male  ?  Birth control/protection: Surgical  ?  Comment: Ablation  ?Other Topics Concern  ? Not on file  ?Social History Narrative  ?   ? She also goes to school full time at [employer]  ? ?Social Determinants of Health  ? ?Financial Resource Strain: Not on file  ?Food Insecurity: Not on file  ?Transportation Needs: Not on file  ?Physical Activity: Not on file  ?Stress: Not on file  ?Social Connections: Not on file  ?Intimate Partner Violence: Not on file  ? ? ?Allergies:  ?Allergies  ?Allergen Reactions  ? Augmentin [Amoxicillin-Pot Clavulanate]   ?  diarrhea  ? Codeine Other (See Comments)  ?  Other reaction(s): Other (See Comments)  ? Hydrocodone-Acetaminophen Nausea And Vomiting  ? ? ?Medications: ?Prior to Admission medications   ?Medication Sig Start Date End Date Taking? Authorizing Provider  ?ASHWAGANDHA PO Take by mouth.   Yes  [provider]  ?Botulinum Toxin Type A (BOTOX) 200 units SOLR Provider to inject 155 units into the muscles of the head and neck every 3 months. Discard remainder. 12/09/21  Yes Ward Givens, NP  ?cyclobenzaprine (FLEXERIL) 5 MG tablet Take 1 tablet by mouth once daily as needed for migraines. 05/28/21  Yes Pleas Koch, NP  ?famotidine (PEPCID) 20 MG tablet Take 1 tablet (20 mg total) by mouth 2 (two) times daily. 12/12/21  Yes Carrie Mew, MD  ?fluconazole (DIFLUCAN) 150 MG tablet Take 1 tab once weekly as preventive for 3 months 2/86/38  Yes Copland, Deirdre Evener, PA-C  ?Fremanezumab-vfrm (AJOVY) 225 MG/1.5ML SOAJ Inject 225 mg into the skin every 30 (  thirty) days. 06/24/21  Yes Melvenia Beam, MD  ?liraglutide (VICTOZA) 18 MG/3ML SOPN Inject 0.6 mg into the skin daily for one week, then increase to 1.2 mg daily thereafter. 11/21/21  Yes Pleas Koch, NP  ?metoCLOPramide (REGLAN) 10 MG tablet Take 1 tablet (10 mg total) by mouth every 6 (six) hours as needed. 12/12/21  Yes Carrie Mew, MD  ?ondansetron (ZOFRAN) 4 MG tablet Take 1 tablet (4 mg total) by mouth every 8 (eight) hours as needed for up to 20 doses for nausea or vomiting. 05/06/21  Yes Virgel Manifold, MD  ?OVER THE COUNTER MEDICATION Maca root Extra strength   Yes [provider]  ?OVER THE COUNTER MEDICATION [place] Multi Strain Vaginal   Yes [provider]  ?OVER THE COUNTER MEDICATION [place] Multivitamin   Yes [provider]  ?oxyCODONE (ROXICODONE) 5 MG immediate release tablet Take 2 tablets (10 mg total) by mouth every 6 (six) hours as needed for up to 5 days for severe pain (may take 1 tablet every 6 hours as needed, can increase to 2 tablets every 6 hours if needed). 12/13/21 12/18/21 Yes Ahmia Colford, Nunzio Cobbs, CNM  ?sertraline (ZOLOFT) 100 MG tablet Take 100 mg by mouth daily. 10/30/21  Yes [provider]  ?SUMAtriptan Succinate Refill 6 MG/0.5ML SOCT Inject 6 mg into the skin at  migraine onset, may repeat with 6 mg 2 hours later if migraine persists. 06/04/21  Yes Pleas Koch, NP  ?Vitamin D, Ergocalciferol, (DRISDOL) 1.25 MG (50000 UNIT) CAPS capsule Take 1 capsule (50,000 Units total) b

## 2021-12-13 NOTE — Telephone Encounter (Signed)
Pt calling; pharm hasn't recv'd rx; pt needs note for work ?

## 2021-12-16 ENCOUNTER — Telehealth: Payer: Self-pay | Admitting: Adult Health

## 2021-12-16 ENCOUNTER — Encounter: Payer: Self-pay | Admitting: Licensed Practical Nurse

## 2021-12-16 DIAGNOSIS — G43009 Migraine without aura, not intractable, without status migrainosus: Secondary | ICD-10-CM

## 2021-12-16 NOTE — Telephone Encounter (Signed)
Pt asking if neurologist can take over prescribing SUMAtriptan Succinate Refill 6 MG/0.5ML SOCT.  Would like to move prescription from PCP to neurologist. Would like a call back. ? ?Have schedule pt with Dr. Jaynee Eagles 02/05/22 at 11am ?

## 2021-12-16 NOTE — Progress Notes (Signed)
TC to confirm Janelly was able to pick up her pain medication, was able to get it on Saturday. The oxy is helping-but makes her sleepy and nauseous. She now has had diarrhea since Saturday and started spotting. She plans to see her GI specialist as soon as she can.  ?Letter given for work. ?Roberto Scales, CNM  ?Mosetta Pigeon, [facility]  ?12/16/21  ?9:01 AM  ? ?

## 2021-12-17 ENCOUNTER — Other Ambulatory Visit: Payer: Self-pay | Admitting: Primary Care

## 2021-12-17 DIAGNOSIS — E559 Vitamin D deficiency, unspecified: Secondary | ICD-10-CM

## 2021-12-17 MED ORDER — SUMATRIPTAN SUCCINATE REFILL 6 MG/0.5ML ~~LOC~~ SOCT: SUBCUTANEOUS | 0 refills | Status: DC

## 2021-12-17 NOTE — Telephone Encounter (Signed)
I refilled prescription

## 2021-12-17 NOTE — Telephone Encounter (Signed)
thanks

## 2021-12-19 ENCOUNTER — Other Ambulatory Visit: Payer: Self-pay | Admitting: Adult Health

## 2021-12-19 MED ORDER — "BD SAFETYGLIDE SYRINGE/NEEDLE 27G X 5/8"" 1 ML MISC": 5 refills | Status: AC

## 2021-12-19 MED ORDER — SUMATRIPTAN SUCCINATE 6 MG/0.5ML ~~LOC~~ SOLN: SUBCUTANEOUS | 8 refills | Status: DC

## 2021-12-19 NOTE — Telephone Encounter (Signed)
Refill with 8 vials.  If she is having to use more than that then we need to discuss her preventative medication ?

## 2021-12-19 NOTE — Addendum Note (Signed)
Addended by: Gildardo Griffes on: 12/19/2021 02:23 PM ? ? Modules accepted: Orders ? ?

## 2021-12-19 NOTE — Addendum Note (Signed)
Addended by: Trudie Buckler on: 12/19/2021 03:29 PM ? ? Modules accepted: Orders ? ?

## 2021-12-30 MED ORDER — SUMATRIPTAN SUCCINATE 6 MG/0.5ML ~~LOC~~ SOLN: SUBCUTANEOUS | 5 refills | Status: DC

## 2021-12-30 NOTE — Addendum Note (Signed)
Addended by: Gildardo Griffes on: 12/30/2021 01:50 PM ? ? Modules accepted: Orders ? ?

## 2021-12-30 NOTE — Telephone Encounter (Signed)
Received fax from [facility]. The Rx was written to dispense only 1 vial. I fixed this and updated Rx to dispense 8 doses which would be 4 mL (0.5 mL per dose). E-scribed back to pharmacy and sent fax to accompany. Received a receipt of confirmation. ? ?

## 2022-01-06 ENCOUNTER — Ambulatory Visit: Payer: BLUE CROSS/BLUE SHIELD | Attending: Licensed Practical Nurse

## 2022-01-13 ENCOUNTER — Ambulatory Visit: Payer: BLUE CROSS/BLUE SHIELD | Admitting: Adult Health

## 2022-01-29 ENCOUNTER — Encounter: Payer: Self-pay | Admitting: Primary Care

## 2022-01-29 ENCOUNTER — Ambulatory Visit (INDEPENDENT_AMBULATORY_CARE_PROVIDER_SITE_OTHER): Payer: BLUE CROSS/BLUE SHIELD | Admitting: Primary Care

## 2022-01-29 VITALS — BP 108/76 | HR 84 | Temp 97.1°F | Resp 14 | Ht 63.5 in | Wt 257.4 lb

## 2022-01-29 DIAGNOSIS — F902 Attention-deficit hyperactivity disorder, combined type: Secondary | ICD-10-CM | POA: Insufficient documentation

## 2022-01-29 DIAGNOSIS — Z9884 Bariatric surgery status: Secondary | ICD-10-CM

## 2022-01-29 DIAGNOSIS — R41840 Attention and concentration deficit: Secondary | ICD-10-CM

## 2022-01-29 DIAGNOSIS — Z6841 Body Mass Index (BMI) 40.0 and over, adult: Secondary | ICD-10-CM

## 2022-01-29 DIAGNOSIS — E785 Hyperlipidemia, unspecified: Secondary | ICD-10-CM

## 2022-01-29 DIAGNOSIS — E559 Vitamin D deficiency, unspecified: Secondary | ICD-10-CM | POA: Diagnosis not present

## 2022-01-29 DIAGNOSIS — G43709 Chronic migraine without aura, not intractable, without status migrainosus: Secondary | ICD-10-CM | POA: Diagnosis not present

## 2022-01-29 DIAGNOSIS — F411 Generalized anxiety disorder: Secondary | ICD-10-CM

## 2022-01-29 DIAGNOSIS — Z0001 Encounter for general adult medical examination with abnormal findings: Secondary | ICD-10-CM | POA: Diagnosis not present

## 2022-01-29 DIAGNOSIS — D509 Iron deficiency anemia, unspecified: Secondary | ICD-10-CM

## 2022-01-29 DIAGNOSIS — R5382 Chronic fatigue, unspecified: Secondary | ICD-10-CM

## 2022-01-29 MED ORDER — ESCITALOPRAM OXALATE 10 MG PO TABS: 10.0000 mg | ORAL_TABLET | Freq: Every day | ORAL | 0 refills | Status: DC

## 2022-01-29 NOTE — Progress Notes (Signed)
? ?Subjective:  ? ? Patient ID: Jean Pitts, female    DOB: 1985-11-05, 36 y.o.   MRN: [id number] ? ?HPI ? ?Jean Pitts is a very pleasant 36 y.o. female who presents today for complete physical and follow up of chronic conditions. ? ?She would also like to discuss difficulty focusing. She has a difficult time focusing, easily distracted, zoned out when people are talking to her, difficulty retaining information. She continues to struggle with anxiety and depression as well. Symptoms include feeling overwhelmed, feeling anxious.  Strong family history of ADHD in her siblings and in her children.  She has no prior formal diagnosis or treatment.  ? ?She meets with a therapist routinely.  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  She is currently managed on Zoloft 100 mg for which she feels is ineffective.  She does not believe that Zoloft was ever effective.  She was once put on propanolol for test anxiety and felt that this worked. ? ?Immunizations: ?-Tetanus: 2013 ?-Influenza: Did not complete last season ?-Covid-19: 1 vaccine ? ?Diet: Fair diet.  ?Exercise: No regular exercise. ? ?Eye exam: Completed several years ago.  ?Dental exam: Completes semi-annually  ? ?Pap Smear: Completed 2023, follows GYN ? ?BP Readings from Last 3 Encounters:  ?01/29/22 108/76  ?12/13/21 118/80  ?12/12/21 (!) 100/57  ? ? ? ? ? ?Review of Systems  ?Constitutional:  Positive for fatigue. Negative for unexpected weight change.  ?HENT:  Negative for rhinorrhea.   ?Respiratory:  Negative for cough and shortness of breath.   ?Cardiovascular:  Negative for chest pain.  ?Gastrointestinal:  Negative for constipation and diarrhea.  ?Genitourinary:  Negative for difficulty urinating.  ?Musculoskeletal:  Positive for arthralgias. Negative for myalgias.  ?Skin:  Negative for  rash.  ?Allergic/Immunologic: Negative for environmental allergies.  ?Neurological:  Positive for headaches. Negative for dizziness.  ?Psychiatric/Behavioral:  Positive for decreased concentration. The patient is nervous/anxious.   ?     See HPI  ? ?   ? ? ?Past Medical History:  ?Diagnosis Date  ? Abnormal glucose tolerance test in pregnancy, antepartum 06/04/2012  ? Formatting of this note might be different from the original. Needs 3h GTT  ? Anemia   ? B12 deficiency   ? BRCA negative 10/2021  ? MyRisk neg  ? Family history of adverse reaction to anesthesia   ? MATERNAL AUNT-PT UNAWARE OF WHAT HAPPENED WITH ANESTHESIA BUT STATES HER AUNT HAS HAD PROBLEMS IN PAST   ? Family history of breast cancer 10/2021  ? IBIS=12.9%/riskscore=17.1%  ? Family history of ovarian cancer   ? Fecal occult blood test positive   ? Genital herpes   ? GERD (gastroesophageal reflux disease)   ? H/O cesarean section complicating pregnancy 81/44/8185  ? 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 Hair thinning 05/12/2018  ? Headache   ? MIGRAINE  ? Heartburn 10/26/2018  ? Hyperlipidemia   ? Hypertension   ? Migraine   ? Obesity   ? PONV (postoperative nausea and vomiting)   ? Syncope 04/2015  ? Vitamin D deficiency   ? Yeast vaginitis 02/23/2019  ? Candida albicans on culture  ? ? ?Social History  ? ?Socioeconomic History  ? Marital status: Married  ?  Spouse name: Not on file  ? Number of children: 2  ? Years of education: Not on file  ? Highest education level:  Some college, no degree  ?Occupational History  ? Occupation: Multimedia programmer   ?Tobacco Use  ? Smoking status: Never  ? Smokeless tobacco: Never  ?Vaping Use  ? Vaping Use: Never used  ?Substance and Sexual Activity  ? Alcohol use: Not Currently  ?  Comment: RARE  ? Drug use: No  ? Sexual activity: Yes  ?  Partners: Male  ?  Birth control/protection: Surgical  ?  Comment: Ablation  ?Other Topics Concern  ? Not on file  ?Social History Narrative  ?   ? She also goes to school full  time at [employer]  ? ?Social Determinants of Health  ? ?Financial Resource Strain: Not on file  ?Food Insecurity: Not on file  ?Transportation Needs: Not on file  ?Physical Activity: Not on file  ?Stress: Not on file  ?Social Connections: Not on file  ?Intimate Partner Violence: Not on file  ? ? ?Past Surgical History:  ?Procedure Laterality Date  ? APPENDECTOMY    ? BUNIONECTOMY    ? CESAREAN SECTION    ? X2  ? COLONOSCOPY WITH PROPOFOL N/A 12/31/2020  ? Procedure: COLONOSCOPY WITH PROPOFOL. eleview and spot ink tattoo used.;  Surgeon: Jean Manifold, MD;  Location: [place];  Service: Endoscopy;  Laterality: N/A;  ? DIAGNOSTIC LAPAROSCOPY    ? ENDOMETRIAL BIOPSY    ? ENTEROSCOPY N/A 01/23/2021  ? Procedure: ENTEROSCOPY;  Surgeon: Jean Manifold, MD;  Location: [facility] ENDOSCOPY;  Service: Endoscopy;  Laterality: N/A;  ? ENTEROSCOPY N/A 02/04/2021  ? Procedure: ENTEROSCOPY;  Surgeon: Jean Artist, MD;  Location: [facility];  Service: Gastroenterology;  Laterality: N/A;  ? ESOPHAGOGASTRODUODENOSCOPY (EGD) WITH PROPOFOL N/A 12/31/2020  ? Procedure: ESOPHAGOGASTRODUODENOSCOPY (EGD) WITH PROPOFOL;  Surgeon: Jean Manifold, MD;  Location: [place];  Service: Endoscopy;  Laterality: N/A;  ? FOREIGN BODY REMOVAL  02/04/2021  ? Procedure: FOREIGN BODY REMOVAL;  Surgeon: Jean Artist, MD;  Location: [facility] ENDOSCOPY;  Service: Gastroenterology;;  staples from other gastric procedure  ? GASTRIC BYPASS    ? GIVENS CAPSULE STUDY N/A 02/19/2021  ? Procedure: GIVENS CAPSULE STUDY;  Surgeon: Jean Manifold, MD;  Location: [facility] ENDOSCOPY;  Service: Endoscopy;  Laterality: N/A;  ? LASIK    ? MOUTH SURGERY    ? Tongue Cyst  ? [place] N/A 06/29/2015  ? Procedure: DILATION AND CURETTAGE, HYSTEROSCOPY, NOVASURE ABLATION;  Surgeon: Jean Mood, MD;  Location: [facility] ORS;  Service: Gynecology;  Laterality: N/A;  ? TUBAL LIGATION    ? WISDOM TOOTH EXTRACTION    ? ? ?Family History  ?Problem  Relation Age of Onset  ? Hemangiomas Mother   ?     benign osseous 2022 MRI  ? Migraines Mother   ? Obesity Mother   ? Hyperlipidemia Mother   ? Obesity Sister   ? Obesity Sister   ? Obesity Brother   ? Ovarian cancer Maternal Grandmother 26  ?     Cervical/Sqammacell  ? Breast cancer Maternal Aunt   ? Neuropathy Maternal Uncle   ? Breast cancer Maternal Great-grandmother   ? Skin cancer Maternal Great-grandmother   ? ? ?Allergies  ?Allergen Reactions  ? Augmentin [Amoxicillin-Pot Clavulanate]   ?  diarrhea  ? Codeine Other (See Comments)  ?  Other reaction(s): Other (See Comments)  ? Hydrocodone-Acetaminophen Nausea And Vomiting  ? ? ?Current Outpatient Medications on File Prior to Visit  ?Medication Sig Dispense Refill  ? ASHWAGANDHA PO Take by mouth.    ?  Botulinum Toxin Type A (BOTOX) 200 units SOLR Provider to inject 155 units into the muscles of the head and neck every 3 months. Discard remainder. 1 each 3  ? cyclobenzaprine (FLEXERIL) 5 MG tablet Take 1 tablet by mouth once daily as needed for migraines. 30 tablet 0  ? fluconazole (DIFLUCAN) 150 MG tablet Take 1 tab once weekly as preventive for 3 months 15 tablet 0  ? Fremanezumab-vfrm (AJOVY) 225 MG/1.5ML SOAJ Inject 225 mg into the skin every 30 (thirty) days. 1.5 mL 11  ? liraglutide (VICTOZA) 18 MG/3ML SOPN Inject 0.6 mg into the skin daily for one week, then increase to 1.2 mg daily thereafter. 27 mL 0  ? ondansetron (ZOFRAN) 4 MG tablet Take 1 tablet (4 mg total) by mouth every 8 (eight) hours as needed for up to 20 doses for nausea or vomiting. 20 tablet 0  ? OVER THE COUNTER MEDICATION Maca root Extra strength    ? OVER THE COUNTER MEDICATION Olly Multi Strain Vaginal    ? OVER THE COUNTER MEDICATION Olly Multivitamin    ? sertraline (ZOLOFT) 100 MG tablet Take 100 mg by mouth daily.    ? SUMAtriptan (IMITREX) 6 MG/0.5ML SOLN injection Inject 6 mg into the skin at migraine onset, may repeat with 6 mg 2 hours later if migraine persists. Max 2 dosages  per 24 hours. 4 mL 5  ? Syringe/Needle, Disp, (BD SAFETYGLIDE SYRINGE/NEEDLE) 27G X 5/8" 1 ML MISC Attach needle to syringe and use to draw up and administer a single dose of Sumatriptan into the skin. D

## 2022-01-29 NOTE — Patient Instructions (Signed)
Schedule a lab appointment in 2 months to return fasting for 4 hours. ? ?Stop taking sertraline (Zoloft) for anxiety. ? ?Start taking escitalopram (Lexapro) 10 mg once daily for anxiety. ? ?I will obtain records from your therapist. ? ?It was a pleasure to see you today! ? ?Preventive Care 36-36 Years Old, Female ?Preventive care refers to lifestyle choices and visits with your health care provider that can promote health and wellness. Preventive care visits are also called wellness exams. ?What can I expect for my preventive care visit? ?Counseling ?During your preventive care visit, your health care provider may ask about your: ?Medical history, including: ?Past medical problems. ?Family medical history. ?Pregnancy history. ?Current health, including: ?Menstrual cycle. ?Method of birth control. ?Emotional well-being. ?Home life and relationship well-being. ?Sexual activity and sexual health. ?Lifestyle, including: ?Alcohol, nicotine or tobacco, and drug use. ?Access to firearms. ?Diet, exercise, and sleep habits. ?Work and work Statistician. ?Sunscreen use. ?Safety issues such as seatbelt and bike helmet use. ?Physical exam ?Your health care provider may check your: ?Height and weight. These may be used to calculate your BMI (body mass index). BMI is a measurement that tells if you are at a healthy weight. ?Waist circumference. This measures the distance around your waistline. This measurement also tells if you are at a healthy weight and may help predict your risk of certain diseases, such as type 2 diabetes and high blood pressure. ?Heart rate and blood pressure. ?Body temperature. ?Skin for abnormal spots. ?What immunizations do I need? ? ?Vaccines are usually given at various ages, according to a schedule. Your health care provider will recommend vaccines for you based on your age, medical history, and lifestyle or other factors, such as travel or where you work. ?What tests do I need? ?Screening ?Your health  care provider may recommend screening tests for certain conditions. This may include: ?Pelvic exam and Pap test. ?Lipid and cholesterol levels. ?Diabetes screening. This is done by checking your blood sugar (glucose) after you have not eaten for a while (fasting). ?Hepatitis B test. ?Hepatitis C test. ?HIV (human immunodeficiency virus) test. ?STI (sexually transmitted infection) testing, if you are at risk. ?BRCA-related cancer screening. This may be done if you have a family history of breast, ovarian, tubal, or peritoneal cancers. ?Talk with your health care provider about your test results, treatment options, and if necessary, the need for more tests. ?Follow these instructions at home: ?Eating and drinking ? ?Eat a healthy diet that includes fresh fruits and vegetables, whole grains, lean protein, and low-fat dairy products. ?Take vitamin and mineral supplements as recommended by your health care provider. ?Do not drink alcohol if: ?Your health care provider tells you not to drink. ?You are pregnant, may be pregnant, or are planning to become pregnant. ?If you drink alcohol: ?Limit how much you have to 0-1 drink a day. ?Know how much alcohol is in your drink. In the [place] one drink equals one 12 oz bottle of beer (355 mL), one 5 oz glass of wine (148 mL), or one 1? oz glass of hard liquor (44 mL). ?Lifestyle ?Brush your teeth every morning and night with fluoride toothpaste. Floss one time each day. ?Exercise for at least 30 minutes 5 or more days each week. ?Do not use any products that contain nicotine or tobacco. These products include cigarettes, chewing tobacco, and vaping devices, such as e-cigarettes. If you need help quitting, ask your health care provider. ?Do not use drugs. ?If you are sexually active, practice safe sex.  Use a condom or other form of protection to prevent STIs. ?If you do not wish to become pregnant, use a form of birth control. If you plan to become pregnant, see your health care  provider for a prepregnancy visit. ?Find healthy ways to manage stress, such as: ?Meditation, yoga, or listening to music. ?Journaling. ?Talking to a trusted person. ?Spending time with friends and family. ?Minimize exposure to UV radiation to reduce your risk of skin cancer. ?Safety ?Always wear your seat belt while driving or riding in a vehicle. ?Do not drive: ?If you have been drinking alcohol. Do not ride with someone who has been drinking. ?If you have been using any mind-altering substances or drugs. ?While texting. ?When you are tired or distracted. ?Wear a helmet and other protective equipment during sports activities. ?If you have firearms in your house, make sure you follow all gun safety procedures. ?Seek help if you have been physically or sexually abused. ?What's next? ?Go to your health care provider once a year for an annual wellness visit. ?Ask your health care provider how often you should have your eyes and teeth checked. ?Stay up to date on all vaccines. ?This information is not intended to replace advice given to you by your health care provider. Make sure you discuss any questions you have with your health care provider. ?Document Revised: 03/13/2021 Document Reviewed: 03/13/2021 ?Elsevier Patient Education ? [place]. ? ? ? ?

## 2022-01-29 NOTE — Assessment & Plan Note (Signed)
Uncontrolled, no improvement with Zoloft 100 mg. ? ?Stop Zoloft 100 mg. ?Start Lexapro 10 mg daily. ? ?Consider addition of Wellbutrin for focus. ? ?She will closely update. ?

## 2022-01-29 NOTE — Assessment & Plan Note (Signed)
Likely multifactorial including uncontrolled anxiety, uncontrolled migraines, obesity. ? ?Reviewed CBC and iron studies from care everywhere per bariatric surgery in April 2023 which were unremarkable. ? ?We will work to control anxiety. ?

## 2022-01-29 NOTE — Assessment & Plan Note (Signed)
Repeat lipid panel in 2 months. ? ?Discussed the importance of a healthy diet and regular exercise in order for weight loss, and to reduce the risk of further co-morbidity. ? ?

## 2022-01-29 NOTE — Assessment & Plan Note (Addendum)
Now following with bariatric clinic through [facility]. ?Notes from April 2023 reviewed. ? ?Strongly advised she work on her diet and to start exercising.  ? ?Victoza and several other medications cost prohibitive.  ?

## 2022-01-29 NOTE — Assessment & Plan Note (Addendum)
Uncontrolled. ? ?Following with Neurology, is pending to start Botox. ? ?Continue Ajovy 225 mg monthly for now. ?Continue Sumatriptan 6 mg PRN.  ?

## 2022-01-29 NOTE — Assessment & Plan Note (Signed)
Controlled. ?Reviewed labs from April 2023 through care everywhere per bariatric surgery. ?

## 2022-01-29 NOTE — Assessment & Plan Note (Signed)
Tetanus due, deferred today. ?Pap smear up-to-date, follows with GYN. ? ?Discussed the importance of a healthy diet and regular exercise in order for weight loss, and to reduce the risk of further co-morbidity. ? ?Exam today as noted. ?Labs pending and also reviewed from care everywhere per bariatric surgery. ?

## 2022-01-29 NOTE — Assessment & Plan Note (Signed)
Reviewed handout that was provided today from her therapist.  Unclear if she has undergone formal ADHD testing as ADHD was an additional diagnosis from a principal diagnosis of borderline personality disorder. ? ?It is also clear that her anxiety is uncontrolled.  We will work to improve. ? ?Will obtain records from her counselor for formal diagnosis of ADHD. ?

## 2022-01-29 NOTE — Assessment & Plan Note (Signed)
She has resumed care with bariatric surgeon through [facility].  Office notes reviewed from April 2023. ? ?Strongly advised to work on diet and exercise. ?

## 2022-02-05 ENCOUNTER — Telehealth: Payer: Self-pay | Admitting: Neurology

## 2022-02-05 ENCOUNTER — Telehealth (INDEPENDENT_AMBULATORY_CARE_PROVIDER_SITE_OTHER): Payer: Self-pay | Admitting: Neurology

## 2022-02-05 DIAGNOSIS — Z91199 Patient's noncompliance with other medical treatment and regimen due to unspecified reason: Secondary | ICD-10-CM

## 2022-02-05 NOTE — Progress Notes (Signed)
Called and left a message at 10:50 for patient stating I will be about 1/2-hour late for our 11am MyChart video, no one was logged in at the time to video, went straight to voicemail, then gave her a call about 1130 and went to voicemail, checked and no one on video call. Patient never logged into video call. No Show.  ?

## 2022-02-05 NOTE — Telephone Encounter (Signed)
Called and left a message for patient stating I will be about 1/2-hour late for our MyChart video, went straight to voicemail, I can give her a call when I am ready to logon. ?

## 2022-02-06 ENCOUNTER — Encounter: Payer: Self-pay | Admitting: Neurology

## 2022-04-02 ENCOUNTER — Other Ambulatory Visit (INDEPENDENT_AMBULATORY_CARE_PROVIDER_SITE_OTHER): Payer: BLUE CROSS/BLUE SHIELD

## 2022-04-02 DIAGNOSIS — Z6841 Body Mass Index (BMI) 40.0 and over, adult: Secondary | ICD-10-CM

## 2022-04-02 DIAGNOSIS — Z9884 Bariatric surgery status: Secondary | ICD-10-CM

## 2022-04-02 DIAGNOSIS — R5382 Chronic fatigue, unspecified: Secondary | ICD-10-CM

## 2022-04-02 DIAGNOSIS — E559 Vitamin D deficiency, unspecified: Secondary | ICD-10-CM

## 2022-04-02 LAB — LIPID PANEL
Cholesterol: 201 mg/dL — ABNORMAL HIGH (ref 0–200)
HDL: 42.6 mg/dL (ref >39.00)
LDL Cholesterol: 126 mg/dL — ABNORMAL HIGH (ref 0–99)
NonHDL: 158.81
Total CHOL/HDL Ratio: 5
Triglycerides: 162 mg/dL — ABNORMAL HIGH (ref 0.0–149.0)
VLDL: 32.4 mg/dL (ref 0.0–40.0)

## 2022-04-02 LAB — VITAMIN D 25 HYDROXY (VIT D DEFICIENCY, FRACTURES): VITD: 28.34 ng/mL — ABNORMAL LOW (ref 30.00–100.00)

## 2022-04-02 LAB — FOLATE: Folate: 7.2 ng/mL (ref >5.9)

## 2022-04-02 LAB — VITAMIN B12: Vitamin B-12: 254 pg/mL (ref 211–911)

## 2022-04-18 ENCOUNTER — Other Ambulatory Visit: Payer: Self-pay | Admitting: Primary Care

## 2022-04-18 DIAGNOSIS — E559 Vitamin D deficiency, unspecified: Secondary | ICD-10-CM

## 2022-04-24 ENCOUNTER — Other Ambulatory Visit: Payer: Self-pay | Admitting: Primary Care

## 2022-04-24 DIAGNOSIS — F411 Generalized anxiety disorder: Secondary | ICD-10-CM

## 2022-07-02 ENCOUNTER — Encounter: Payer: Self-pay | Admitting: *Deleted

## 2022-07-02 ENCOUNTER — Telehealth: Payer: Self-pay | Admitting: *Deleted

## 2022-07-02 ENCOUNTER — Encounter: Payer: Self-pay | Admitting: Oncology

## 2022-07-02 ENCOUNTER — Encounter: Payer: Self-pay | Admitting: Neurology

## 2022-07-02 ENCOUNTER — Telehealth (INDEPENDENT_AMBULATORY_CARE_PROVIDER_SITE_OTHER): Payer: BLUE CROSS/BLUE SHIELD | Admitting: Neurology

## 2022-07-02 ENCOUNTER — Telehealth (HOSPITAL_COMMUNITY): Payer: Self-pay

## 2022-07-02 ENCOUNTER — Other Ambulatory Visit (HOSPITAL_COMMUNITY): Payer: Self-pay

## 2022-07-02 DIAGNOSIS — G43709 Chronic migraine without aura, not intractable, without status migrainosus: Secondary | ICD-10-CM

## 2022-07-02 MED ORDER — TOPIRAMATE 25 MG PO TABS: 25.0000 mg | ORAL_TABLET | Freq: Every evening | ORAL | 6 refills | Status: DC

## 2022-07-02 NOTE — Telephone Encounter (Signed)
BotoxOne verification submitted   Key #  [id number]

## 2022-07-02 NOTE — Telephone Encounter (Addendum)
Her botox auth expired on 05/29/22. We have to get new auth before she can proceed. I sent the patient a mychart message letting her know.   ----- Message ----- From: Melvenia Beam, MD Sent: 07/02/2022  11:34 AM EDT To: Ward Givens, NP; [facility] Calls Subject: botox for patient, sounds already approved a#  Patient never reached out to specialty pharmacy to get botox. She will be doing that. Ward Givens can inject the botox. Patient will mychart me when she reaches out to the specialty pharmacy and if we know when the medication will be here roughly we can set her up with megan for botox.

## 2022-07-02 NOTE — Telephone Encounter (Signed)
I sent the pa request in another phone note to the Rx prior auth team.

## 2022-07-02 NOTE — Telephone Encounter (Signed)
Chronic Migraine CPT 64615  Botox J0585 Units: 200  G43.709 Chronic Migraine without aura, not intractable, without status migrainous  Botox auth needed. Technically even though she was approved before, she was never injected.

## 2022-07-02 NOTE — Patient Instructions (Signed)
Reach out to specialty pharmacy for botox and let us know  OnabotulinumtoxinA Injection (Medical Use) What is this medication? ONABOTULINUMTOXINA (o na BOTT you lye num tox in eh) treats severe muscle spasms. It may also be used to prevent migraine headaches. It can treat excessive sweating when other medications do not work well enough. This medicine may be used for other purposes; ask your health care provider or pharmacist if you have questions. COMMON BRAND NAME(S): Botox What should I tell my care team before I take this medication? They need to know if you have any of these conditions: Breathing problems Cerebral palsy spasms Difficulty urinating Heart problems History of surgery where this medication is going to be used Infection at the site where this medication is going to be used Myasthenia gravis or other neurologic disease Nerve or muscle disease Surgery plans Take medications that treat or prevent blood clots Thyroid problems An unusual or allergic reaction to botulinum toxin, albumin, other medications, foods, dyes, or preservatives Pregnant or trying to get pregnant Breast-feeding How should I use this medication? This medication is for injected into a muscle. It is given by your care team in a hospital or clinic setting. A special MedGuide will be given to you before each treatment. Be sure to read this information carefully each time. Talk to your care team about the use of this medication in children. While this medication may be prescribed for children as young as 2 years for selected conditions, precautions do apply. Overdosage: If you think you have taken too much of this medicine contact a poison control center or emergency room at once. NOTE: This medicine is only for you. Do not share this medicine with others. What if I miss a dose? This does not apply. What may interact with this medication? Aminoglycoside antibiotics, such as gentamicin, neomycin,  tobramycin Muscle relaxants Other botulinum toxin injections This list may not describe all possible interactions. Give your health care provider a list of all the medicines, herbs, non-prescription drugs, or dietary supplements you use. Also tell them if you smoke, drink alcohol, or use illegal drugs. Some items may interact with your medicine. What should I watch for while using this medication? Visit your care team for regular check ups. This medication will cause weakness in the muscle where it is injected. Tell your care team if you feel unusually weak in other muscles. Get medical help right away if you have problems with breathing, swallowing, or talking. This medication might make your eyelids droop or make you see blurry or double. If you have weak muscles or trouble seeing do not drive a car, use machinery, or do other dangerous activities. This medication contains albumin from human blood. It may be possible to pass an infection in this medication, but no cases have been reported. Talk to your care team about the risks and benefits of this medication. If your activities have been limited by your condition, go back to your regular routine slowly after treatment with this medication. What side effects may I notice from receiving this medication? Side effects that you should report to your care team as soon as possible: Allergic reactions--skin rash, itching, hives, swelling of the face, lips, tongue, or throat Dryness or irritation of the eyes, eye pain, change in vision, sensitivity to light Infection--fever, chills, cough, sore throat, wounds that don't heal, pain or trouble when passing urine, general feeling of discomfort or being unwell Spread of botulinum toxin effects--unusual weakness or fatigue, blurry or double  vision, trouble swallowing, hoarseness or trouble speaking, trouble breathing, loss of bladder control Trouble passing urine Side effects that usually do not require  medical attention (report these to your care team if they continue or are bothersome): Dry mouth Eyelid drooping Fatigue Headache Pain, redness, or irritation at injection site This list may not describe all possible side effects. Call your doctor for medical advice about side effects. You may report side effects to FDA at 1-800-FDA-1088. Where should I keep my medication? This medication is given in a hospital or clinic and will not be stored at home. NOTE: This sheet is a summary. It may not cover all possible information. If you have questions about this medicine, talk to your doctor, pharmacist, or health care provider.  2023 Elsevier/Gold Standard (2021-07-29 00:00:00)

## 2022-07-02 NOTE — Telephone Encounter (Signed)
PA #[id number] expired 05/29/22. She will need a new PA before being able to get set up with botox. Once approved, she will need to call [facility] to set up her shipment to the office.  Per  Dr. Jaynee Eagles, her botox appointment is to be scheduled with [facility].

## 2022-07-02 NOTE — Progress Notes (Signed)
PATIENT: RUBEN PYKA DOB: 1985-11-09  REASON FOR VISIT: follow up HISTORY FROM: patient  Virtual Visit via Video Note  I connected with [name] on 07/02/22 at 11:00 AM EDT by a video enabled telemedicine application and verified that I am speaking with the correct person using two identifiers.  Location: Patient: home Provider: office   I discussed the limitations of evaluation and management by telemedicine and the availability of in person appointments. The patient expressed understanding and agreed to proceed.  Follow Up Instructions:    I discussed the assessment and treatment plan with the patient. The patient was provided an opportunity to ask questions and all were answered. The patient agreed with the plan and demonstrated an understanding of the instructions.   The patient was advised to call back or seek an in-person evaluation if the symptoms worsen or if the condition fails to improve as anticipated.  I provided over 20 minutes of non-face-to-face time during this encounter.   Melvenia Beam, MD   PATIENT: [name] DOB: [birth year]  REASON FOR VISIT: follow up HISTORY FROM: patient  HISTORY OF PRESENT ILLNESS:  07/02/2022: Patient failed to reach out to reach to specialty pharmacy for the botox so she never started it. She stopped the Ajovy, she felt "sleep and out of it". Nothing has changed as far as her migraines.we discussed botox and other options. I informed her of the botox copay program. She has to reach out to the specialty pharmacy and reach out. Advised reach out to the specialty pharmacy, and if she decide to go ahead and order let me know through mychart bc while the medication is on the way we can set her up with appointment if the specialty pharmacy when it will get here. Discussed botox and 3 injections for maximum efficacy, every 3 months. She has daily headaches and at least 15 migraines month. She used to be on Topamax but  made her very sleepy, she wants to start the lowest dose will start 55m. Discussed limiting caffeine use.   Patient complains of symptoms per HPI as well as the following symptoms: migraines . Pertinent negatives and positives per HPI. All others negative  10/22/2021 MWard GivensNP  Ms. SApseyis a 36year old female with a history of migraine headaches.  She returns today for follow-up.  She states that Ajovy has been beneficial but she still has frequent headaches.  She states that she has a migraine headache approximately 1 day a week.  She has a dull headache at least 3 to 4 days a week.  Her headaches are typically in the frontal region.  She does take Tylenol for these daily dull headaches.  For severe headaches that do not respond to Tylenol she will take Imitrex injection.  Reports that it does make her sleepy so she cannot take while she is at work or if she is going to drive.  HISTORY KKEELYN MONJARASis a 36y.o. female here as requested by CPleas Koch NP for headaches.  Past medical history migraines, hyperlipidemia, hypertension, obesity, syncope.  I reviewed KVeatrice Kellsnotes: Patient has ongoing dizziness and fatigue, dizziness does not occur if sitting or remaining still, she has to have supervision shower, she is currently managed on propranolol extended release 80 mg for migraines, she is also tried Topamax which causes drowsiness and amitriptyline which was not effective, she is experiencing headaches near daily.   She has had headache since she  can remember, mother with migraines, always throbbing, worse over the years, 3 years ago she had the most severe, she had to go to the ED and has been multiple times since; the migraines are unilateral, pulsating/pounding, stiffness in the neck, nausea, photophobia/phonophobia, they "put me down", a dark room helps, stress and sleeping can affect it, hasn't noticed weather affecting her. Daily headaches. Mod to severe migraine at  least 1/2 the month 15 days, can last 24-72 hours at least, sometimes OTC meds help and the imitrex but doesn't completely take it away sometimes she gets it under the control but it is still there. No aura. She had dizziness but she has been having a lot dizziness recently, unclear if associated with migraines or not. She has been up to 242m of topamax and she is a "zombie" cognitive issues, amitriptylone also gave side effects, propranolol gave her dizziness and heart rate and BP issues. She does not snore, wakes with headache, blurry vision when the headaches are bad, eyes hurt and so sensitivie, morning headaches can be the worse when supine. No other focal neurologic deficits, associated symptoms, inciting events or modifiable factors.     Reviewed notes, labs and imaging from outside physicians, which showed :   Tried: amitriptyline, propranolol, atenolol, topamax, flexeril(all side effects)   CT head 05/2006 showed No acute intracranial abnormalities including mass lesion or mass effect, hydrocephalus, extra-axial fluid collection, midline shift, hemorrhage, or acute infarction, large ischemic events (personally reviewed images)   Cbc/cmp unremarkable 04/2021 01/18/2021: tsh nml 1.29  REVIEW OF SYSTEMS: Out of a complete 14 system review of symptoms, the patient complains only of the following symptoms, and all other reviewed systems are negative.  ALLERGIES: Allergies  Allergen Reactions   Augmentin [Amoxicillin-Pot Clavulanate]     diarrhea   Codeine Other (See Comments)    Other reaction(s): Other (See Comments)   Hydrocodone-Acetaminophen Nausea And Vomiting    HOME MEDICATIONS: Outpatient Medications Prior to Visit  Medication Sig Dispense Refill   ASHWAGANDHA PO Take by mouth.     Botulinum Toxin Type A (BOTOX) 200 units SOLR Provider to inject 155 units into the muscles of the head and neck every 3 months. Discard remainder. 1 each 3   cyclobenzaprine (FLEXERIL) 5 MG tablet  Take 1 tablet by mouth once daily as needed for migraines. 30 tablet 0   escitalopram (LEXAPRO) 10 MG tablet TAKE 1 TABLET (10 MG TOTAL) BY MOUTH DAILY. FOR ANXIETY AND DEPRESSION. 90 tablet 2   fluconazole (DIFLUCAN) 150 MG tablet Take 1 tab once weekly as preventive for 3 months 15 tablet 0   Fremanezumab-vfrm (AJOVY) 225 MG/1.5ML SOAJ Inject 225 mg into the skin every 30 (thirty) days. 1.5 mL 11   ondansetron (ZOFRAN) 4 MG tablet Take 1 tablet (4 mg total) by mouth every 8 (eight) hours as needed for up to 20 doses for nausea or vomiting. 20 tablet 0   OVER THE COUNTER MEDICATION Maca root Extra strength     OVER THE COUNTER MEDICATION Olly Multi Strain Vaginal     OVER THE COUNTER MEDICATION Olly Multivitamin     SUMAtriptan (IMITREX) 6 MG/0.5ML SOLN injection Inject 6 mg into the skin at migraine onset, may repeat with 6 mg 2 hours later if migraine persists. Max 2 dosages per 24 hours. 4 mL 5   Syringe/Needle, Disp, (BD SAFETYGLIDE SYRINGE/NEEDLE) 27G X 5/8" 1 ML MISC Attach needle to syringe and use to draw up and administer a single dose of Sumatriptan  into the skin. Do not reuse. 10 each 5   Vitamin D, Ergocalciferol, (DRISDOL) 1.25 MG (50000 UNIT) CAPS capsule TAKE 1 CAPSULE (50,000 UNITS TOTAL) BY MOUTH EVERY 7 (SEVEN) DAYS. FOR VITAMIN D. 12 capsule 0   No facility-administered medications prior to visit.    PAST MEDICAL HISTORY: Past Medical History:  Diagnosis Date   Abnormal glucose tolerance test in pregnancy, antepartum 06/04/2012   Formatting of this note might be different from the original. 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 eleview and spot ink tattoo used.;  Surgeon: Virgel Manifold, MD;  Location: [facility];  Service: Endoscopy;  Laterality: N/A;   DIAGNOSTIC LAPAROSCOPY     ENDOMETRIAL BIOPSY     ENTEROSCOPY N/A 01/23/2021   Procedure: ENTEROSCOPY;  Surgeon: Virgel Manifold, MD;  Location: [facility];  Service: Endoscopy;  Laterality: N/A;   ENTEROSCOPY N/A 02/04/2021   Procedure: ENTEROSCOPY;  Surgeon: Ladene Artist, MD;  Location: [facility];  Service: Gastroenterology;  Laterality: N/A;   ESOPHAGOGASTRODUODENOSCOPY (EGD) WITH PROPOFOL N/A 12/31/2020   Procedure: ESOPHAGOGASTRODUODENOSCOPY (EGD) WITH PROPOFOL;  Surgeon: Virgel Manifold, MD;  Location: [facility];  Service: Endoscopy;  Laterality: N/A;   FOREIGN BODY REMOVAL  02/04/2021   Procedure: FOREIGN BODY REMOVAL;  Surgeon: Ladene Artist, MD;  Location: [facility];  Service: Gastroenterology;;  staples from other gastric procedure   GASTRIC BYPASS     GIVENS CAPSULE STUDY N/A 02/19/2021   Procedure: GIVENS CAPSULE STUDY;  Surgeon: Virgel Manifold, MD;  Location: [facility];  Service: Endoscopy;  Laterality: N/A;   LASIK     MOUTH SURGERY     Tongue Cyst    NOVASURE ABLATION N/A 06/29/2015   Procedure: DILATION AND CURETTAGE, HYSTEROSCOPY, NOVASURE ABLATION;  Surgeon: Malachy Mood, MD;  Location: [facility];  Service: Gynecology;  Laterality: N/A;   TUBAL LIGATION     WISDOM TOOTH EXTRACTION      FAMILY HISTORY: Family History  Problem Relation Age of Onset   Hemangiomas Mother        benign osseous 2022 MRI   Migraines Mother    Obesity Mother    Hyperlipidemia Mother    Obesity  Sister    Obesity Sister    Obesity Brother    Ovarian cancer Maternal Grandmother 26       Cervical/Sqammacell   Breast cancer Maternal Aunt    Neuropathy Maternal Uncle    Breast cancer Maternal Great-grandmother    Skin cancer Maternal Great-grandmother     SOCIAL HISTORY: Social History   Socioeconomic History   Marital status: Married    Spouse name: Not on file   Number of children: 2   Years of education: Not on file   Highest education level: Some college, no degree  Occupational History   Occupation: Multimedia programmer   Tobacco Use   Smoking status: Never   Smokeless tobacco: Never  Vaping Use   Vaping Use: Never used  Substance and Sexual Activity   Alcohol use: Not Currently    Comment: RARE   Drug use: No   Sexual activity: Yes    Partners: Male    Birth control/protection: Surgical    Comment: Ablation  Other Topics Concern   Not on file  Social History Narrative      She also goes to school full time at [employer] Strain: Unknown (05/12/2018)   Overall Financial Resource Strain (CARDIA)    Difficulty of Paying Living Expenses: Patient refused  Food Insecurity: No Food Insecurity (05/12/2018)   Hunger Vital Sign    Worried About Running Out of Food in the Last Year: Never true    Ran Out of Food in the Last Year: Never true  Transportation Needs: No Transportation Needs (05/12/2018)   PRAPARE - Hydrologist (Medical): No    Lack of  Transportation (Non-Medical): No  Physical Activity: Inactive (05/12/2018)   Exercise Vital Sign    Days of Exercise per Week: 0 days    Minutes of Exercise per Session: 0 min  Stress: No Stress Concern Present (05/12/2018)   [place]    Feeling of Stress : Not at all  Social Connections: Unknown (05/12/2018)   Social Connection and Isolation Panel [NHANES]    Frequency of Communication with Friends and Family: More than three times a week    Frequency of Social Gatherings with Friends and Family: More than three times a week    Attends Religious Services: More than 4 times per year    Active Member of [employer] or Organizations: No    Attends Archivist Meetings: Never    Marital Status: Not on file  Intimate Partner Violence: Not At Risk (05/12/2018)   Humiliation, Afraid, Rape, and Kick questionnaire    Fear of Current or Ex-Partner: No    Emotionally Abused: No    Physically Abused: No    Sexually Abused: No     Physical exam: Exam: Gen: NAD, conversant      CV: No apparent palpitations or chest pain or SOB. VS: Breathing at a normal rate. Weight appears obese. Not febrile. Eyes: Conjunctivae clear without exudates or hemorrhage  Neuro: Detailed Neurologic Exam  Speech:    Speech is normal; fluent and spontaneous with normal comprehension.  Cognition:    The patient is oriented to person, place, and time;     recent and remote memory intact;     language fluent;     normal attention, concentration,     fund of knowledge Cranial Nerves:    The pupils are equal, round, and  reactive to light. Visual fields are full to finger confrontation. Extraocular movements are intact.  The face is symmetric with normal sensation. The palate elevates in the midline. Hearing intact. Voice is normal. Shoulder shrug is normal. The tongue has normal motion without fasciculations.   Motor Observation:   no involuntary  movements noted. Tone:    Appears normal  Posture:    Posture is normal. normal erect    Strength:    Strength is anti-gravity and symmetric in the upper and lower limbs.      Sensation: intact to LT      DIAGNOSTIC DATA (LABS, IMAGING, TESTING) - I reviewed patient records, labs, notes, testing and imaging myself where available.  Lab Results  Component Value Date   WBC 8.9 12/12/2021   HGB 11.9 (L) 12/12/2021   HCT 36.2 12/12/2021   MCV 92.8 12/12/2021   PLT 337 12/12/2021      Component Value Date/Time   NA 138 12/12/2021 0609   NA 140 06/25/2020 1521   K 3.6 12/12/2021 0609   CL 102 12/12/2021 0609   CO2 28 12/12/2021 0609   GLUCOSE 90 12/12/2021 0609   BUN 14 12/12/2021 0609   BUN 8 06/25/2020 1521   CREATININE 0.89 12/12/2021 0609   CALCIUM 8.4 (L) 12/12/2021 0609   PROT 6.9 12/12/2021 0609   PROT 7.0 06/25/2020 1521   ALBUMIN 3.5 12/12/2021 0609   ALBUMIN 4.3 06/25/2020 1521   AST 18 12/12/2021 0609   ALT 14 12/12/2021 0609   ALKPHOS 78 12/12/2021 0609   BILITOT 0.5 12/12/2021 0609   BILITOT 0.2 06/25/2020 1521   GFRNONAA >60 12/12/2021 0609   GFRAA 118 06/25/2020 1521   Lab Results  Component Value Date   CHOL 201 (H) 04/02/2022   HDL 42.60 04/02/2022   LDLCALC 126 (H) 04/02/2022   TRIG 162.0 (H) 04/02/2022   CHOLHDL 5 04/02/2022   Lab Results  Component Value Date   HGBA1C 5.3 11/12/2021   Lab Results  Component Value Date   VITAMINB12 254 04/02/2022   Lab Results  Component Value Date   TSH 1.77 11/12/2021      ASSESSMENT AND PLAN 36 y.o. year old female  has a past medical history of Abnormal glucose tolerance test in pregnancy, antepartum (06/04/2012), Absolute anemia (10/22/2012), Anemia, B12 deficiency, Black tarry stools (12/20/2020), BRCA negative (10/2021), Family history of adverse reaction to anesthesia, Family history of breast cancer (10/2021), Family history of ovarian cancer, Fecal occult blood test positive, Genital  herpes, GERD (gastroesophageal reflux disease), H/O cesarean section complicating pregnancy (13/24/4010), H/O gastric bypass (02/19/2021), Hair thinning (05/12/2018), Headache, Heartburn (10/26/2018), History of Roux-en-Y gastric bypass, Hyperlipidemia, Hypertension, Migraine, Obesity, PONV (postoperative nausea and vomiting), Syncope (04/2015), Vitamin D deficiency, and Yeast vaginitis (02/23/2019). here with :  1. Chronic Migraine headaches  Patient failed to reach out to reach to specialty pharmacy for the botox so she never started it. She stopped the Ajovy, she felt "sleep and out of it". Nothing has changed as far as her migraines.we discussed botox and other options. I informed her of the botox copay program.  Advised reach out to the specialty pharmacy, and if she decide to go ahead and order let me know through mychart bc while the medication is on the way we can set her up with appointment if the specialty pharmacy lets her knowwhen it will get here.  Ward Givens can inject the botoc Discussed botox and 3 injections for maximum efficacy, every 3 months. She  has daily headaches and at least 15 migraines month.  She used to be on Topamax but made her very sleepy, she wants to start the lowest dose will start 30m. Discussed limiting caffeine use.   Meds ordered this encounter  Medications   topiramate (TOPAMAX) 25 MG tablet    Sig: Take 1 tablet (25 mg total) by mouth at bedtime.    Dispense:  30 tablet    Refill:  6[facility]evern [facility] [address] [place] [phone]

## 2022-07-08 ENCOUNTER — Telehealth: Payer: Self-pay | Admitting: Pharmacy Technician

## 2022-07-08 ENCOUNTER — Other Ambulatory Visit (HOSPITAL_COMMUNITY): Payer: Self-pay

## 2022-07-08 NOTE — Telephone Encounter (Signed)
Patient Advocate Encounter   Received notification that prior authorization for Botox 200UNIT solution is required.   PA submitted on 07/08/2022 Key [id number] Status is pending       Lyndel Safe, [place] Patient Advocate Specialist [facility] Patient Advocate Team Direct Number: [phone]  Fax: [phone]

## 2022-07-17 ENCOUNTER — Other Ambulatory Visit (HOSPITAL_COMMUNITY): Payer: Self-pay

## 2022-07-17 NOTE — Telephone Encounter (Signed)
Patient Advocate Encounter   Prior Authorization for Botox 200 units has been approved.     PA# [id number] Effective dates: 07/17/2022 through 07/17/2023   Buy and [employer]

## 2022-07-17 NOTE — Telephone Encounter (Signed)
Great thanks. Pt scheduled for 08/05/22.

## 2022-07-17 NOTE — Telephone Encounter (Signed)
Pending Botox [facility] PA # [id number]

## 2022-07-17 NOTE — Telephone Encounter (Signed)
Patient Advocate Encounter  Prior Authorization for Botox 200 units has been approved.    PA# [id number] Effective dates: 07/17/2022 through 07/17/2023  Buy and Elton Sin, [place] Patient Advocate Specialist [facility] Patient Advocate Team Direct Number: [phone]  Fax: [phone]

## 2022-07-23 ENCOUNTER — Other Ambulatory Visit: Payer: Self-pay | Admitting: Obstetrics and Gynecology

## 2022-07-23 ENCOUNTER — Encounter: Payer: Self-pay | Admitting: Obstetrics and Gynecology

## 2022-07-23 DIAGNOSIS — B3731 Acute candidiasis of vulva and vagina: Secondary | ICD-10-CM

## 2022-07-24 MED ORDER — FLUCONAZOLE 150 MG PO TABS: ORAL_TABLET | ORAL | 0 refills | Status: DC

## 2022-08-05 ENCOUNTER — Ambulatory Visit (INDEPENDENT_AMBULATORY_CARE_PROVIDER_SITE_OTHER): Payer: BLUE CROSS/BLUE SHIELD | Admitting: Neurology

## 2022-08-05 DIAGNOSIS — G43709 Chronic migraine without aura, not intractable, without status migrainosus: Secondary | ICD-10-CM | POA: Diagnosis not present

## 2022-08-05 MED ORDER — ONABOTULINUMTOXINA 200 UNITS IJ SOLR
155.0000 [IU] | Freq: Once | INTRAMUSCULAR | Status: AC
  Administered 2022-08-05: 155 [IU] via INTRAMUSCULAR

## 2022-08-05 NOTE — Progress Notes (Signed)
Botox- 200 units x 1 vial Lot: [id number] Expiration: 10/2024 NDC: 5974-7185-50  Bacteriostatic 0.9% Sodium Chloride- 91m total Lot: 05/31/2023 Expiration: [id number]: [address]-17 Dx: GG71.595 B/B

## 2022-08-05 NOTE — Progress Notes (Signed)
Consent Form Botulism Toxin Injection For Chronic Migraine   First botox  Reviewed orally with patient, additionally signature is on file:  Botulism toxin has been approved by the Federal drug administration for treatment of chronic migraine. Botulism toxin does not cure chronic migraine and it may not be effective in some patients.  The administration of botulism toxin is accomplished by injecting a small amount of toxin into the muscles of the neck and head. Dosage must be titrated for each individual. Any benefits resulting from botulism toxin tend to wear off after 3 months with a repeat injection required if benefit is to be maintained. Injections are usually done every 3-4 months with maximum effect peak achieved by about 2 or 3 weeks. Botulism toxin is expensive and you should be sure of what costs you will incur resulting from the injection.  The side effects of botulism toxin use for chronic migraine may include:   -Transient, and usually mild, facial weakness with facial injections  -Transient, and usually mild, head or neck weakness with head/neck injections  -Reduction or loss of forehead facial animation due to forehead muscle weakness  -Eyelid drooping  -Dry eye  -Pain at the site of injection or bruising at the site of injection  -Double vision  -Potential unknown long term risks  Contraindications: You should not have Botox if you are pregnant, nursing, allergic to albumin, have an infection, skin condition, or muscle weakness at the site of the injection, or have myasthenia gravis, Lambert-Eaton syndrome, or ALS.  It is also possible that as with any injection, there may be an allergic reaction or no effect from the medication. Reduced effectiveness after repeated injections is sometimes seen and rarely infection at the injection site may occur. All care will be taken to prevent these side effects. If therapy is given over a long time, atrophy and wasting in the muscle  injected may occur. Occasionally the patient's become refractory to treatment because they develop antibodies to the toxin. In this event, therapy needs to be modified.  I have read the above information and consent to the administration of botulism toxin.    BOTOX PROCEDURE NOTE FOR MIGRAINE HEADACHE    Contraindications and precautions discussed with patient(above). Aseptic procedure was observed and patient tolerated procedure. Procedure performed by Dr. Georgia Dom  The condition has existed for more than 6 months, and pt does not have a diagnosis of ALS, Myasthenia Gravis or Lambert-Eaton Syndrome.  Risks and benefits of injections discussed and pt agrees to proceed with the procedure.  Written consent obtained  These injections are medically necessary. Pt  receives good benefits from these injections. These injections do not cause sedations or hallucinations which the oral therapies may cause.  Description of procedure:  The patient was placed in a sitting position. The standard protocol was used for Botox as follows, with 5 units of Botox injected at each site:   -Procerus muscle, midline injection  -Corrugator muscle, bilateral injection  -Frontalis muscle, bilateral injection, with 2 sites each side, medial injection was performed in the upper one third of the frontalis muscle, in the region vertical from the medial inferior edge of the superior orbital rim. The lateral injection was again in the upper one third of the forehead vertically above the lateral limbus of the cornea, 1.5 cm lateral to the medial injection site.  -Temporalis muscle injection, 4 sites, bilaterally. The first injection was 3 cm above the tragus of the ear, second injection site was 1.5 cm  to 3 cm up from the first injection site in line with the tragus of the ear. The third injection site was 1.5-3 cm forward between the first 2 injection sites. The fourth injection site was 1.5 cm posterior to the second  injection site.   -Occipitalis muscle injection, 3 sites, bilaterally. The first injection was done one half way between the occipital protuberance and the tip of the mastoid process behind the ear. The second injection site was done lateral and superior to the first, 1 fingerbreadth from the first injection. The third injection site was 1 fingerbreadth superiorly and medially from the first injection site.  -Cervical paraspinal muscle injection, 2 sites, bilateral knee first injection site was 1 cm from the midline of the cervical spine, 3 cm inferior to the lower border of the occipital protuberance. The second injection site was 1.5 cm superiorly and laterally to the first injection site.  -Trapezius muscle injection was performed at 3 sites, bilaterally. The first injection site was in the upper trapezius muscle halfway between the inflection point of the neck, and the acromion. The second injection site was one half way between the acromion and the first injection site. The third injection was done between the first injection site and the inflection point of the neck.   Will return for repeat injection in 3 months.   200 units of Botox was used, any Botox not injected was wasted. The patient tolerated the procedure well, there were no complications of the above procedure.

## 2022-08-13 ENCOUNTER — Encounter: Payer: Self-pay | Admitting: Primary Care

## 2022-08-13 ENCOUNTER — Ambulatory Visit: Payer: BLUE CROSS/BLUE SHIELD | Admitting: Primary Care

## 2022-08-13 VITALS — BP 100/72 | HR 102 | Temp 97.3°F | Ht 63.5 in | Wt 257.0 lb

## 2022-08-13 DIAGNOSIS — J029 Acute pharyngitis, unspecified: Secondary | ICD-10-CM

## 2022-08-13 LAB — POCT INFLUENZA A/B
Influenza A, POC: NEGATIVE
Influenza B, POC: NEGATIVE

## 2022-08-13 LAB — POC COVID19 BINAXNOW: SARS Coronavirus 2 Ag: NEGATIVE

## 2022-08-13 LAB — POCT RAPID STREP A (OFFICE): Rapid Strep A Screen: POSITIVE — AB

## 2022-08-13 MED ORDER — AMOXICILLIN 250 MG/5ML PO SUSR: 500.0000 mg | Freq: Two times a day (BID) | ORAL | 0 refills | Status: AC

## 2022-08-13 NOTE — Addendum Note (Signed)
Addended by: Pat Kocher on: 08/13/2022 11:22 AM   Modules accepted: Orders

## 2022-08-13 NOTE — Patient Instructions (Signed)
Start amoxicillin suspension for your symptoms. Take 10 ml twice daily for 10 days.  You can take Tylenol or Ibuprofen as needed for pain.   Nasal Congestion/Ear Pressure/Sinus Pressure: Try using Flonase (fluticasone) nasal spray. Instill 1 spray in each nostril twice daily.   It was a pleasure to see you today!

## 2022-08-13 NOTE — Progress Notes (Signed)
 Subjective:    Patient ID: Martrice D Schmall, female    DOB: 08/02/1986, 36 y.o.   MRN: [id number]  Sore Throat  Associated symptoms include congestion, coughing and headaches.    Niralya D Lippman is a very pleasant 36 y.o. female with a history of migraines, anemia, gastric bypass surgery, GAD, chronic fatigue who presents today to discuss sore throat.  Symptom onset two days ago with sore throat, painful swallowing, and raw sensation to her throat. She then developed cough, sneezing, headaches, runny nose, chills.    She denies sick contacts. She's been taking Tylenol for her sore throat with some improvement. She's unable to eat anything given her sore throat. She is working on taking in liquids.   BP Readings from Last 3 Encounters:  08/13/22 100/72  01/29/22 108/76  12/13/21 118/80      Review of Systems  Constitutional:  Positive for chills, fatigue and fever.  HENT:  Positive for congestion and sore throat.   Respiratory:  Positive for cough.   Neurological:  Positive for headaches.         Past Medical History:  Diagnosis Date   Abnormal glucose tolerance test in pregnancy, antepartum 06/04/2012   Formatting of this note might be different from the original. Needs 3h GTT   Absolute anemia 10/22/2012   Anemia    B12 deficiency    Black tarry stools 12/20/2020   BRCA negative 10/2021   MyRisk neg   Family history of adverse reaction to anesthesia    MATERNAL AUNT-PT UNAWARE OF WHAT HAPPENED WITH ANESTHESIA BUT STATES HER AUNT HAS HAD PROBLEMS IN PAST    Family history of breast cancer 10/2021   IBIS=12.9%/riskscore=17.1%   Family history of ovarian cancer    Fecal occult blood test positive    Genital herpes    GERD (gastroesophageal reflux disease)    H/O cesarean section complicating pregnancy 04/19/2012   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/19/2021   Hair thinning 05/12/2018   Headache    MIGRAINE   Heartburn 10/26/2018   History of Roux-en-Y gastric bypass     Hyperlipidemia    Hypertension    Migraine    Obesity    PONV (postoperative nausea and vomiting)    Syncope 04/2015   Vitamin D deficiency    Yeast vaginitis 02/23/2019   Candida albicans on culture    Social History   Socioeconomic History   Marital status: Married    Spouse name: Not on file   Number of children: 2   Years of education: Not on file   Highest education level: Some college, no degree  Occupational History   Occupation: adminstrative assistant   Tobacco Use   Smoking status: Never   Smokeless tobacco: Never  Vaping Use   Vaping Use: Never used  Substance and Sexual Activity   Alcohol use: Not Currently    Comment: RARE   Drug use: No   Sexual activity: Yes    Partners: Male    Birth control/protection: Surgical    Comment: Ablation  Other Topics Concern   Not on file  Social History Narrative      She also goes to school full time at [employer]   Social Determinants of Health   Financial Resource Strain: Unknown (05/12/2018)   Overall Financial Resource Strain (CARDIA)    Difficulty of Paying Living Expenses: Patient refused  Food Insecurity: No Food Insecurity (05/12/2018)   Hunger Vital Sign    Worried About Running Out of Food in   the Last Year: Never true    Ran Out of Food in the Last Year: Never true  Transportation Needs: No Transportation Needs (05/12/2018)   PRAPARE - Transportation    Lack of Transportation (Medical): No    Lack of Transportation (Non-Medical): No  Physical Activity: Inactive (05/12/2018)   Exercise Vital Sign    Days of Exercise per Week: 0 days    Minutes of Exercise per Session: 0 min  Stress: No Stress Concern Present (05/12/2018)   [employer] - Occupational Stress Questionnaire    Feeling of Stress : Not at all  Social Connections: Unknown (05/12/2018)   Social Connection and Isolation Panel [NHANES]    Frequency of Communication with Friends and Family: More than three times a week     Frequency of Social Gatherings with Friends and Family: More than three times a week    Attends Religious Services: More than 4 times per year    Active Member of Clubs or Organizations: No    Attends Club or Organization Meetings: Never    Marital Status: Not on file  Intimate Partner Violence: Not At Risk (05/12/2018)   Humiliation, Afraid, Rape, and Kick questionnaire    Fear of Current or Ex-Partner: No    Emotionally Abused: No    Physically Abused: No    Sexually Abused: No    Past Surgical History:  Procedure Laterality Date   APPENDECTOMY     BUNIONECTOMY     CESAREAN SECTION     X2   COLONOSCOPY WITH PROPOFOL N/A 12/31/2020   Procedure: COLONOSCOPY WITH PROPOFOL. eleview and spot ink tattoo used.;  Surgeon: Tahiliani, Varnita B, MD;  Location: [facility];  Service: Endoscopy;  Laterality: N/A;   DIAGNOSTIC LAPAROSCOPY     ENDOMETRIAL BIOPSY     ENTEROSCOPY N/A 01/23/2021   Procedure: ENTEROSCOPY;  Surgeon: Tahiliani, Varnita B, MD;  Location: [facility];  Service: Endoscopy;  Laterality: N/A;   ENTEROSCOPY N/A 02/04/2021   Procedure: ENTEROSCOPY;  Surgeon: Stark, Malcolm T, MD;  Location: [facility];  Service: Gastroenterology;  Laterality: N/A;   ESOPHAGOGASTRODUODENOSCOPY (EGD) WITH PROPOFOL N/A 12/31/2020   Procedure: ESOPHAGOGASTRODUODENOSCOPY (EGD) WITH PROPOFOL;  Surgeon: Tahiliani, Varnita B, MD;  Location: [facility];  Service: Endoscopy;  Laterality: N/A;   FOREIGN BODY REMOVAL  02/04/2021   Procedure: FOREIGN BODY REMOVAL;  Surgeon: Stark, Malcolm T, MD;  Location: [facility];  Service: Gastroenterology;;  staples from other gastric procedure   GASTRIC BYPASS     GIVENS CAPSULE STUDY N/A 02/19/2021   Procedure: GIVENS CAPSULE STUDY;  Surgeon: Tahiliani, Varnita B, MD;  Location: [facility];  Service: Endoscopy;  Laterality: N/A;   LASIK     MOUTH SURGERY     Tongue Cyst   NOVASURE ABLATION N/A 06/29/2015   Procedure: DILATION AND CURETTAGE,  HYSTEROSCOPY, NOVASURE ABLATION;  Surgeon: Andreas Staebler, MD;  Location: [facility];  Service: Gynecology;  Laterality: N/A;   TUBAL LIGATION     WISDOM TOOTH EXTRACTION      Family History  Problem Relation Age of Onset   Hemangiomas Mother        benign osseous 2022 MRI   Migraines Mother    Obesity Mother    Hyperlipidemia Mother    Obesity Sister    Obesity Sister    Obesity Brother    Ovarian cancer Maternal Grandmother 26       Cervical/Sqammacell   Breast cancer Maternal Aunt    Neuropathy Maternal Uncle      Breast cancer Maternal Great-grandmother    Skin cancer Maternal Great-grandmother     Allergies  Allergen Reactions   Augmentin [Amoxicillin-Pot Clavulanate]     diarrhea   Codeine Other (See Comments)    Other reaction(s): Other (See Comments)   Hydrocodone-Acetaminophen Nausea And Vomiting    Current Outpatient Medications on File Prior to Visit  Medication Sig Dispense Refill   ASHWAGANDHA PO Take by mouth.     Botulinum Toxin Type A (BOTOX) 200 units SOLR Provider to inject 155 units into the muscles of the head and neck every 3 months. Discard remainder. 1 each 3   cyclobenzaprine (FLEXERIL) 5 MG tablet Take 1 tablet by mouth once daily as needed for migraines. 30 tablet 0   escitalopram (LEXAPRO) 10 MG tablet TAKE 1 TABLET (10 MG TOTAL) BY MOUTH DAILY. FOR ANXIETY AND DEPRESSION. 90 tablet 2   fluconazole (DIFLUCAN) 150 MG tablet Take 1 tab once weekly as preventive for 3 months 15 tablet 0   MOUNJARO 5 MG/0.5ML Pen Inject into the skin.     ondansetron (ZOFRAN) 4 MG tablet Take 1 tablet (4 mg total) by mouth every 8 (eight) hours as needed for up to 20 doses for nausea or vomiting. 20 tablet 0   OVER THE COUNTER MEDICATION Maca root Extra strength     OVER THE COUNTER MEDICATION Olly Multi Strain Vaginal     OVER THE COUNTER MEDICATION Olly Multivitamin     SUMAtriptan (IMITREX) 6 MG/0.5ML SOLN injection Inject 6 mg into the skin at migraine onset, may  repeat with 6 mg 2 hours later if migraine persists. Max 2 dosages per 24 hours. 4 mL 5   Syringe/Needle, Disp, (BD SAFETYGLIDE SYRINGE/NEEDLE) 27G X 5/8" 1 ML MISC Attach needle to syringe and use to draw up and administer a single dose of Sumatriptan into the skin. Do not reuse. 10 each 5   topiramate (TOPAMAX) 25 MG tablet Take 1 tablet (25 mg total) by mouth at bedtime. (Patient taking differently: Take 12.5 mg by mouth at bedtime.) 30 tablet 6   Vitamin D, Ergocalciferol, (DRISDOL) 1.25 MG (50000 UNIT) CAPS capsule TAKE 1 CAPSULE (50,000 UNITS TOTAL) BY MOUTH EVERY 7 (SEVEN) DAYS. FOR VITAMIN D. 12 capsule 0   Fremanezumab-vfrm (AJOVY) 225 MG/1.5ML SOAJ Inject 225 mg into the skin every 30 (thirty) days. (Patient not taking: Reported on 08/13/2022) 1.5 mL 11   No current facility-administered medications on file prior to visit.    BP 100/72   Pulse (!) 102   Temp (!) 97.3 F (36.3 C) (Temporal)   Ht 5' 3.5" (1.613 m)   Wt 257 lb (116.6 kg)   SpO2 98%   BMI 44.81 kg/m  Objective:   Physical Exam Constitutional:      Appearance: She is ill-appearing.  HENT:     Right Ear: Ear canal normal.     Mouth/Throat:     Pharynx: Posterior oropharyngeal erythema present.  Cardiovascular:     Rate and Rhythm: Tachycardia present.  Pulmonary:     Effort: Pulmonary effort is normal.     Breath sounds: Normal breath sounds. No wheezing.     Comments: Dry cough noted several times during visit.  Lymphadenopathy:     Cervical: Cervical adenopathy present.  Skin:    General: Skin is warm and dry.           Assessment & Plan:   Problem List Items Addressed This Visit       Other     Sore throat - Primary    Rapid strep testing today with faint positive line.  Given her symptoms, coupled with presentation, will treat for strep pharyngitis.  She prefers liquid treatment. Start Amoxil 250 mg/5ml suspension. Take 500 mg BID x 10 days.  Discussed use of Ibuprofen/Tylenol. Work  note provided.   Follow up PRN.      Relevant Medications   amoxicillin (AMOXIL) 250 MG/5ML suspension       Katherine K Clark, NP    

## 2022-08-13 NOTE — Assessment & Plan Note (Signed)
Rapid strep testing today with faint positive line.  Given her symptoms, coupled with presentation, will treat for strep pharyngitis.  She prefers liquid treatment. Start Amoxil 250 mg/83m suspension. Take 500 mg BID x 10 days.  Discussed use of Ibuprofen/Tylenol. Work note provided.   Follow up PRN.

## 2022-09-09 ENCOUNTER — Telehealth (INDEPENDENT_AMBULATORY_CARE_PROVIDER_SITE_OTHER): Payer: BLUE CROSS/BLUE SHIELD | Admitting: Primary Care

## 2022-09-09 ENCOUNTER — Encounter: Payer: Self-pay | Admitting: Primary Care

## 2022-09-09 DIAGNOSIS — F331 Major depressive disorder, recurrent, moderate: Secondary | ICD-10-CM | POA: Insufficient documentation

## 2022-09-09 DIAGNOSIS — F411 Generalized anxiety disorder: Secondary | ICD-10-CM | POA: Diagnosis not present

## 2022-09-09 MED ORDER — FLUOXETINE HCL 20 MG PO TABS: 20.0000 mg | ORAL_TABLET | Freq: Every day | ORAL | 0 refills | Status: DC

## 2022-09-09 NOTE — Assessment & Plan Note (Signed)
Uncontrolled on Lexapro 10 mg daily.  Would avoid dose increase to 20 mg given history of binge eating disorder and obesity.  Stop Lexapro 10 mg daily. Start fluoxetine 20 mg daily. Continue weekly therapy sessions.  She will update via MyChart in 1 month.

## 2022-09-09 NOTE — Progress Notes (Signed)
Patient ID: Jean Pitts, female    DOB: 30-Dec-1985, 36 y.o.   MRN: [id number]  Virtual visit completed through [employer], a video enabled telemedicine application. Due to national recommendations of social distancing due to COVID-19, a virtual visit is felt to be most appropriate for this patient at this time. Reviewed limitations, risks, security and privacy concerns of performing a virtual visit and the availability of in person appointments. I also reviewed that there may be a patient responsible charge related to this service. The patient agreed to proceed.   Patient location: home Provider location: [place] at [facility], office Persons participating in this virtual visit: patient, provider   If any vitals were documented, they were collected by patient at home unless specified below.    There were no vitals taken for this visit.   CC: Depression Subjective:   HPI: Jean Pitts is a 36 y.o. female with a history of anxiety, chronic fatigue, difficulty concentrating, morbid obesity, chronic migraines presenting on 09/09/2022 for Depression (Wants to discuss increasing meds)  She's undergoing a lot of "life changes" at home and at work which has caused her to feel emotional and down. Other symptoms include feeling tearful, mood swings, feeling irritable, feeling overwhelmed.   Currently managed on Lexapro 10 mg daily for anxiety for which she has been taking for >6 months. She's really unsure if Lexapro has helped with her symptoms of anxiety.  Previously managed on Zoloft with max dose of 100 mg without improvement.  She continues to follow with therapy weekly individually and also with her husband weekly.   Her therapist recommended she touch base with [place] regarding a dose adjustment of her medication/further treatment of her ongoing symptoms.       Relevant past medical, surgical, family and social history reviewed and updated as indicated. Interim medical history since  our last visit reviewed. Allergies and medications reviewed and updated. Outpatient Medications Prior to Visit  Medication Sig Dispense Refill   ASHWAGANDHA PO Take by mouth.     Botulinum Toxin Type A (BOTOX) 200 units SOLR Provider to inject 155 units into the muscles of the head and neck every 3 months. Discard remainder. 1 each 3   cyclobenzaprine (FLEXERIL) 5 MG tablet Take 1 tablet by mouth once daily as needed for migraines. 30 tablet 0   fluconazole (DIFLUCAN) 150 MG tablet Take 1 tab once weekly as preventive for 3 months 15 tablet 0   Fremanezumab-vfrm (AJOVY) 225 MG/1.5ML SOAJ Inject 225 mg into the skin every 30 (thirty) days. 1.5 mL 11   ondansetron (ZOFRAN) 4 MG tablet Take 1 tablet (4 mg total) by mouth every 8 (eight) hours as needed for up to 20 doses for nausea or vomiting. 20 tablet 0   OVER THE COUNTER MEDICATION Maca root Extra strength     OVER THE COUNTER MEDICATION Olly Multi Strain Vaginal     OVER THE COUNTER MEDICATION Olly Multivitamin     SUMAtriptan (IMITREX) 6 MG/0.5ML SOLN injection Inject 6 mg into the skin at migraine onset, may repeat with 6 mg 2 hours later if migraine persists. Max 2 dosages per 24 hours. 4 mL 5   Syringe/Needle, Disp, (BD SAFETYGLIDE SYRINGE/NEEDLE) 27G X 5/8" 1 ML MISC Attach needle to syringe and use to draw up and administer a single dose of Sumatriptan into the skin. Do not reuse. 10 each 5   topiramate (TOPAMAX) 25 MG tablet Take 1 tablet (25 mg total) by mouth at bedtime. (  Patient taking differently: Take 12.5 mg by mouth at bedtime.) 30 tablet 6   Vitamin D, Ergocalciferol, (DRISDOL) 1.25 MG (50000 UNIT) CAPS capsule TAKE 1 CAPSULE (50,000 UNITS TOTAL) BY MOUTH EVERY 7 (SEVEN) DAYS. FOR VITAMIN D. 12 capsule 0   escitalopram (LEXAPRO) 10 MG tablet TAKE 1 TABLET (10 MG TOTAL) BY MOUTH DAILY. FOR ANXIETY AND DEPRESSION. 90 tablet 2   No facility-administered medications prior to visit.     Per HPI unless specifically indicated in ROS  section below Review of Systems  Respiratory:  Negative for shortness of breath.   Cardiovascular:  Negative for chest pain.  Psychiatric/Behavioral:  The patient is nervous/anxious.        See HPI   Objective:  There were no vitals taken for this visit.  Wt Readings from Last 3 Encounters:  08/13/22 257 lb (116.6 kg)  01/29/22 257 lb 6 oz (116.7 kg)  12/13/21 266 lb (120.7 kg)       Physical exam: General: Alert and oriented x 3, no distress, does not appear sickly  Pulmonary: Speaks in complete sentences without increased work of breathing, no cough during visit.  Psychiatric: Normal mood, thought content, and behavior.     Results for orders placed or performed in visit on 08/13/22  POCT rapid strep A  Result Value Ref Range   Rapid Strep A Screen Positive (A) Negative  Influenza A/B  Result Value Ref Range   Influenza A, POC Negative Negative   Influenza B, POC Negative Negative  POC COVID-19  Result Value Ref Range   SARS Coronavirus 2 Ag Negative Negative   Assessment & Plan:   Problem List Items Addressed This Visit       Other   GAD (generalized anxiety disorder)    Uncontrolled on Lexapro 10 mg daily.  Would avoid dose increase to 20 mg given history of binge eating disorder and obesity.  Stop Lexapro 10 mg daily. Start fluoxetine 20 mg daily. Continue weekly therapy sessions.  She will update via MyChart in 1 month.      Relevant Medications   FLUoxetine (PROZAC) 20 MG tablet   Moderate episode of recurrent major depressive disorder ([place]) - Primary    Uncontrolled on Lexapro 10 mg daily.  Would avoid dose increase to 20 mg given history of binge eating disorder and obesity.  Stop Lexapro 10 mg daily. Start fluoxetine 20 mg daily. Continue weekly therapy sessions.  She will update via MyChart in 1 month.      Relevant Medications   FLUoxetine (PROZAC) 20 MG tablet     Meds ordered this encounter  Medications   FLUoxetine (PROZAC) 20  MG tablet    Sig: Take 1 tablet (20 mg total) by mouth daily. for anxiety and depression.    Dispense:  90 tablet    Refill:  0    Order Specific Question:   Supervising Provider    Answer:   BEDSOLE, AMY E [[id number]]   No orders of the defined types were placed in this encounter.   I discussed the assessment and treatment plan with the patient. The patient was provided an opportunity to ask questions and all were answered. The patient agreed with the plan and demonstrated an understanding of the instructions. The patient was advised to call back or seek an in-person evaluation if the symptoms worsen or if the condition fails to improve as anticipated.  Follow up plan:  Stop taking Lexapro 10 mg for anxiety/depression.  Start fluoxetine 20  mg once daily for anxiety/depression.  Please update me via MyChart in 1 month as discussed.  It was a pleasure to see you today!   Pleas Koch, NP

## 2022-09-10 ENCOUNTER — Other Ambulatory Visit: Payer: Self-pay

## 2022-09-10 ENCOUNTER — Encounter: Payer: Self-pay | Admitting: Oncology

## 2022-09-10 ENCOUNTER — Telehealth: Payer: Self-pay

## 2022-09-10 DIAGNOSIS — Z86010 Personal history of colonic polyps: Secondary | ICD-10-CM

## 2022-09-10 MED ORDER — GOLYTELY 236 G PO SOLR: 4000.0000 mL | Freq: Once | ORAL | 0 refills | Status: AC

## 2022-09-10 NOTE — Telephone Encounter (Signed)
Patient called because she states she was due for a repeat colonoscopy in April and has been putting off scheduling. She asked if we could schedule it before the end of the year since she has met her insuranc.  Informed patient that Dr. Bonna Gains is no longer with [place] but we do have another female Dr. Marius Ditch. She states she wants to schedule with her. Schedule her for 09/18/2022 in [place]. Went over instructions and sent to [facility]. Sent prep to the pharmacy

## 2022-09-15 ENCOUNTER — Encounter: Payer: Self-pay | Admitting: Gastroenterology

## 2022-09-18 ENCOUNTER — Other Ambulatory Visit: Payer: Self-pay

## 2022-09-18 ENCOUNTER — Ambulatory Visit
Admission: RE | Admit: 2022-09-18 | Discharge: 2022-09-18 | Disposition: A | Discharge status: Home / Self Care | Payer: BLUE CROSS/BLUE SHIELD | Attending: Gastroenterology | Admitting: Gastroenterology

## 2022-09-18 ENCOUNTER — Encounter: Payer: Self-pay | Admitting: Gastroenterology

## 2022-09-18 ENCOUNTER — Ambulatory Visit: Payer: BLUE CROSS/BLUE SHIELD | Admitting: Anesthesiology

## 2022-09-18 ENCOUNTER — Encounter
Admission: RE | Disposition: A | Discharge status: Home / Self Care | Payer: Self-pay | Source: Home / Self Care | Attending: Gastroenterology

## 2022-09-18 DIAGNOSIS — D122 Benign neoplasm of ascending colon: Secondary | ICD-10-CM | POA: Diagnosis not present

## 2022-09-18 DIAGNOSIS — K635 Polyp of colon: Secondary | ICD-10-CM | POA: Diagnosis not present

## 2022-09-18 DIAGNOSIS — K219 Gastro-esophageal reflux disease without esophagitis: Secondary | ICD-10-CM | POA: Insufficient documentation

## 2022-09-18 DIAGNOSIS — Z6841 Body Mass Index (BMI) 40.0 and over, adult: Secondary | ICD-10-CM | POA: Diagnosis not present

## 2022-09-18 DIAGNOSIS — D125 Benign neoplasm of sigmoid colon: Secondary | ICD-10-CM | POA: Diagnosis not present

## 2022-09-18 DIAGNOSIS — E785 Hyperlipidemia, unspecified: Secondary | ICD-10-CM | POA: Diagnosis not present

## 2022-09-18 DIAGNOSIS — Z860100 Personal history of colon polyps, unspecified: Secondary | ICD-10-CM

## 2022-09-18 DIAGNOSIS — I10 Essential (primary) hypertension: Secondary | ICD-10-CM | POA: Diagnosis not present

## 2022-09-18 DIAGNOSIS — E669 Obesity, unspecified: Secondary | ICD-10-CM | POA: Insufficient documentation

## 2022-09-18 DIAGNOSIS — Z9884 Bariatric surgery status: Secondary | ICD-10-CM | POA: Diagnosis not present

## 2022-09-18 DIAGNOSIS — Z09 Encounter for follow-up examination after completed treatment for conditions other than malignant neoplasm: Secondary | ICD-10-CM | POA: Insufficient documentation

## 2022-09-18 HISTORY — PX: COLONOSCOPY WITH PROPOFOL: SHX5780

## 2022-09-18 HISTORY — PX: POLYPECTOMY: SHX5525

## 2022-09-18 LAB — POCT PREGNANCY, URINE: Preg Test, Ur: NEGATIVE

## 2022-09-18 SURGERY — COLONOSCOPY WITH PROPOFOL
Anesthesia: General | Site: Rectum

## 2022-09-18 MED ORDER — LIDOCAINE HCL (CARDIAC) PF 100 MG/5ML IV SOSY
PREFILLED_SYRINGE | INTRAVENOUS | Status: DC | PRN
  Administered 2022-09-18: 60 mg via INTRAVENOUS

## 2022-09-18 MED ORDER — PROPOFOL 10 MG/ML IV BOLUS
INTRAVENOUS | Status: DC | PRN
  Administered 2022-09-18: 200 ug/kg/min via INTRAVENOUS
  Administered 2022-09-18: 30 mg via INTRAVENOUS

## 2022-09-18 MED ORDER — SODIUM CHLORIDE 0.9 % IV SOLN: INTRAVENOUS | Status: DC

## 2022-09-18 MED ORDER — LACTATED RINGERS IV SOLN: INTRAVENOUS | Status: DC

## 2022-09-18 MED ORDER — STERILE WATER FOR IRRIGATION IR SOLN
Status: DC | PRN
  Administered 2022-09-18: 50 mL

## 2022-09-18 SURGICAL SUPPLY — 10 items
GOWN CVR UNV OPN BCK APRN NK (MISCELLANEOUS) ×4 IMPLANT
GOWN ISOL THUMB LOOP REG UNIV (MISCELLANEOUS) ×4
INJECTOR VARIJECT VIN23 (MISCELLANEOUS) IMPLANT
KIT PRC NS LF DISP ENDO (KITS) ×2 IMPLANT
KIT PROCEDURE OLYMPUS (KITS) ×2
MANIFOLD NEPTUNE II (INSTRUMENTS) ×2 IMPLANT
SNARE COLD EXACTO (MISCELLANEOUS) IMPLANT
TRAP ETRAP POLY (MISCELLANEOUS) IMPLANT
VARIJECT INJECTOR VIN23 (MISCELLANEOUS) ×2
WATER STERILE IRR 250ML POUR (IV SOLUTION) ×2 IMPLANT

## 2022-09-18 NOTE — Transfer of Care (Signed)
Immediate Anesthesia Transfer of Care Note  Patient: Jean Pitts  Procedure(s) Performed: COLONOSCOPY WITH PROPOFOL (Rectum) POLYPECTOMY (Rectum)  Patient Location: PACU  Anesthesia Type: General  Level of Consciousness: awake, alert  and patient cooperative  Airway and Oxygen Therapy: Patient Spontanous Breathing and Patient connected to supplemental oxygen  Post-op Assessment: Post-op Vital signs reviewed, Patient's Cardiovascular Status Stable, Respiratory Function Stable, Patent Airway and No signs of Nausea or vomiting  Post-op Vital Signs: Reviewed and stable  Complications: No notable events documented.

## 2022-09-18 NOTE — Anesthesia Postprocedure Evaluation (Signed)
Anesthesia Post Note  Patient: Jean Pitts  Procedure(s) Performed: COLONOSCOPY WITH PROPOFOL (Rectum) POLYPECTOMY (Rectum)  Patient location during evaluation: PACU Anesthesia Type: General Level of consciousness: awake and alert Pain management: pain level controlled Vital Signs Assessment: post-procedure vital signs reviewed and stable Respiratory status: spontaneous breathing, nonlabored ventilation, respiratory function stable and patient connected to nasal cannula oxygen Cardiovascular status: blood pressure returned to baseline and stable Postop Assessment: no apparent nausea or vomiting Anesthetic complications: no   There were no known notable events for this encounter.   Last Vitals:  Vitals:   09/18/22 1000 09/18/22 1006  BP: 119/67 133/73  Pulse: 71 71  Resp: 20 17  Temp:    SpO2: 97% 97%    Last Pain:  Vitals:   09/18/22 1006  TempSrc:   PainSc: 0-No pain                 Precious Haws Adian Jablonowski

## 2022-09-18 NOTE — H&P (Signed)
Cephas Darby, MD [address]  [place]  [place], [place] [address]  Main: [phone]  Fax: [phone] Pager: [phone]  Primary Care Physician:  Pleas Koch, NP Primary Gastroenterologist:  Dr. Cephas Darby  Pre-Procedure History & Physical: HPI:  Jean Pitts is a 36 y.o. female is here for an colonoscopy.   Past Medical History:  Diagnosis Date   Abnormal glucose tolerance test in pregnancy, antepartum 06/04/2012   Formatting of this note might be different from the original. 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 eleview and spot ink tattoo used.;  Surgeon: Virgel Manifold, MD;  Location: [place];  Service: Endoscopy;  Laterality: N/A;   DIAGNOSTIC LAPAROSCOPY     ENDOMETRIAL BIOPSY     ENTEROSCOPY N/A 01/23/2021   Procedure: ENTEROSCOPY;  Surgeon: Virgel Manifold, MD;   Location: [facility];  Service: Endoscopy;  Laterality: N/A;   ENTEROSCOPY N/A 02/04/2021   Procedure: ENTEROSCOPY;  Surgeon: Ladene Artist, MD;  [facility];  Service: Gastroenterology;  Laterality: N/A;   ESOPHAGOGASTRODUODENOSCOPY (EGD) WITH PROPOFOL N/A 12/31/2020   Procedure: ESOPHAGOGASTRODUODENOSCOPY (EGD) WITH PROPOFOL;  Surgeon: Virgel Manifold, MD;  Location: [place];  Service: Endoscopy;  Laterality: N/A;   FOREIGN BODY REMOVAL  02/04/2021   Procedure: FOREIGN BODY REMOVAL;  Surgeon: Ladene Artist, MD;  [facility];  Service: Gastroenterology;;  staples from other gastric procedure   GASTRIC BYPASS     GIVENS CAPSULE STUDY N/A 02/19/2021   Procedure: GIVENS CAPSULE STUDY;  Surgeon: Virgel Manifold, MD;  Location: [facility];  Service: Endoscopy;  Laterality: N/A;   LASIK     MOUTH SURGERY     Tongue Cyst   NOVASURE ABLATION N/A 06/29/2015   Procedure: DILATION AND CURETTAGE, HYSTEROSCOPY, NOVASURE ABLATION;  Surgeon: Malachy Mood, MD;  Location: [facility];  Service: Gynecology;  Laterality: N/A;   TUBAL LIGATION     WISDOM TOOTH EXTRACTION      Prior to Admission medications   Medication Sig Start Date End Date Taking? Authorizing Provider  ASHWAGANDHA PO Take by mouth.   Yes [provider]  Botulinum Toxin Type A (BOTOX) 200 units SOLR Provider to inject  155 units into the muscles of the head and neck every 3 months. Discard remainder. 12/09/21  Yes Ward Givens, NP  cyclobenzaprine (FLEXERIL) 5 MG tablet Take 1 tablet by mouth once daily as needed for migraines. 05/28/21  Yes Pleas Koch, NP  fluconazole (DIFLUCAN) 150 MG tablet Take 1 tab once weekly as preventive for 3 months 51/02/58  Yes Copland, Alicia B, PA-C  FLUoxetine (PROZAC) 20 MG tablet Take 1 tablet (20 mg total) by mouth daily. for anxiety and depression. 09/09/22  Yes Pleas Koch, NP  ondansetron (ZOFRAN) 4 MG tablet Take 1 tablet (4  mg total) by mouth every 8 (eight) hours as needed for up to 20 doses for nausea or vomiting. 05/06/21  Yes Virgel Manifold, MD  OVER THE COUNTER MEDICATION Maca root Extra strength   Yes [provider]  OVER THE COUNTER MEDICATION Olly Multi Strain Vaginal   Yes [provider]  OVER THE COUNTER MEDICATION Olly Multivitamin   Yes [provider]  SUMAtriptan (IMITREX) 6 MG/0.5ML SOLN injection Inject 6 mg into the skin at migraine onset, may repeat with 6 mg 2 hours later if migraine persists. Max 2 dosages per 24 hours. 12/30/21  Yes Ward Givens, NP  tirzepatide [facility]) 10 MG/0.5ML Pen Inject 10 mg into the skin once a week.   Yes [provider]  Vitamin D, Ergocalciferol, (DRISDOL) 1.25 MG (50000 UNIT) CAPS capsule TAKE 1 CAPSULE (50,000 UNITS TOTAL) BY MOUTH EVERY 7 (SEVEN) DAYS. FOR VITAMIN D. 04/18/22  Yes Pleas Koch, NP  Syringe/Needle, Disp, (BD SAFETYGLIDE SYRINGE/NEEDLE) 27G X 5/8" 1 ML MISC Attach needle to syringe and use to draw up and administer a single dose of Sumatriptan into the skin. Do not reuse. 12/19/21   Ward Givens, NP  topiramate (TOPAMAX) 25 MG tablet Take 1 tablet (25 mg total) by mouth at bedtime. Patient not taking: Reported on 09/15/2022 07/02/22   Melvenia Beam, MD    Allergies as of 09/10/2022 - Review Complete 09/09/2022  Allergen Reaction Noted   Augmentin [amoxicillin-pot clavulanate]  03/06/2021   Codeine Other (See Comments) 04/12/2014   Hydrocodone-acetaminophen Nausea And Vomiting 06/26/2015    Family History  Problem Relation Age of Onset   Hemangiomas Mother        benign osseous 2022 MRI   Migraines Mother    Obesity Mother    Hyperlipidemia Mother    Obesity Sister    Obesity Sister    Obesity Brother    Ovarian cancer Maternal Grandmother 26       Cervical/Sqammacell   Breast cancer Maternal Aunt    Neuropathy Maternal Uncle    Breast cancer Maternal Great-grandmother    Skin cancer  Maternal Great-grandmother     Social History   Socioeconomic History   Marital status: Married    Spouse name: Not on file   Number of children: 2   Years of education: Not on file   Highest education level: Some college, no degree  Occupational History   Occupation: Multimedia programmer   Tobacco Use   Smoking status: Never   Smokeless tobacco: Never  Vaping Use   Vaping Use: Never used  Substance and Sexual Activity   Alcohol use: Not Currently    Comment: RARE   Drug use: No   Sexual activity: Yes    Partners: Male    Birth control/protection: Surgical    Comment: Ablation  Other Topics Concern   Not on file  Social History  Narrative      She also goes to school full time at [employer] Strain: Unknown (05/12/2018)   Overall Financial Resource Strain (CARDIA)    Difficulty of Paying Living Expenses: Patient refused  Food Insecurity: No Food Insecurity (05/12/2018)   Hunger Vital Sign    Worried About Running Out of Food in the Last Year: Never true    Ran Out of Food in the Last Year: Never true  Transportation Needs: No Transportation Needs (05/12/2018)   PRAPARE - Hydrologist (Medical): No    Lack of Transportation (Non-Medical): No  Physical Activity: Inactive (05/12/2018)   Exercise Vital Sign    Days of Exercise per Week: 0 days    Minutes of Exercise per Session: 0 min  Stress: No Stress Concern Present (05/12/2018)   [place]    Feeling of Stress : Not at all  Social Connections: Unknown (05/12/2018)   Social Connection and Isolation Panel [NHANES]    Frequency of Communication with Friends and Family: More than three times a week    Frequency of Social Gatherings with Friends and Family: More than three times a week    Attends Religious Services: More than 4 times per year    Active Member of [employer] or  Organizations: No    Attends Archivist Meetings: Never    Marital Status: Not on file  Intimate Partner Violence: Not At Risk (05/12/2018)   Humiliation, Afraid, Rape, and Kick questionnaire    Fear of Current or Ex-Partner: No    Emotionally Abused: No    Physically Abused: No    Sexually Abused: No    Review of Systems: See HPI, otherwise negative ROS  Physical Exam: BP (!) 111/94   Pulse 74   Temp 97.8 F (36.6 C) (Temporal)   Resp 18   Ht 5' 3.5" (1.613 m)   Wt 110.2 kg   SpO2 95%   BMI 42.37 kg/m  General:   Alert,  pleasant and cooperative in NAD Head:  Normocephalic and atraumatic. Neck:  Supple; no masses or thyromegaly. Lungs:  Clear throughout to auscultation.    Heart:  Regular rate and rhythm. Abdomen:  Soft, nontender and nondistended. Normal bowel sounds, without guarding, and without rebound.   Neurologic:  Alert and  oriented x4;  grossly normal neurologically.  Impression/Plan: Jean Pitts is here for an colonoscopy to be performed for follow up of colon adenoma, piecemeal resection  Risks, benefits, limitations, and alternatives regarding  colonoscopy have been reviewed with the patient.  Questions have been answered.  All parties agreeable.   Sherri Sear, MD  09/18/2022, 8:59 AM

## 2022-09-18 NOTE — Anesthesia Preprocedure Evaluation (Signed)
Anesthesia Evaluation  Patient identified by MRN, date of birth, ID band Patient awake    Reviewed: Allergy & Precautions, NPO status , Patient's Chart, lab work & pertinent test results  History of Anesthesia Complications (+) PONV and history of anesthetic complications  Airway Mallampati: III  TM Distance: >3 FB Neck ROM: full    Dental  (+) Chipped   Pulmonary neg pulmonary ROS, neg shortness of breath   Pulmonary exam normal        Cardiovascular hypertension, (-) angina negative cardio ROS Normal cardiovascular exam     Neuro/Psych  Headaches PSYCHIATRIC DISORDERS         GI/Hepatic Neg liver ROS,GERD  Controlled,,  Endo/Other  negative endocrine ROS    Renal/GU negative Renal ROS  negative genitourinary   Musculoskeletal   Abdominal   Peds  Hematology negative hematology ROS (+)   Anesthesia Other Findings Past Medical History: 06/04/2012: Abnormal glucose tolerance test in pregnancy, antepartum     Comment:  Formatting of this note might be different from the               original. Needs 3h GTT 10/22/2012: Absolute anemia No date: Anemia No date: B12 deficiency 12/20/2020: Black tarry stools 10/2021: BRCA negative     Comment:  MyRisk neg No date: Family history of adverse reaction to anesthesia     Comment:  MATERNAL AUNT-PT UNAWARE OF WHAT HAPPENED WITH               ANESTHESIA BUT STATES HER AUNT HAS HAD PROBLEMS IN PAST  10/2021: Family history of breast cancer     Comment:  IBIS=12.9%/riskscore=17.1% No date: Family history of ovarian cancer No date: Fecal occult blood test positive No date: Genital herpes No date: GERD (gastroesophageal reflux disease) 04/19/2012: H/O cesarean section complicating pregnancy 93/71/6967: H/O gastric bypass 05/12/2018: Hair thinning No date: Headache     Comment:  MIGRAINE 10/26/2018: Heartburn No date: History of Roux-en-Y gastric bypass No date:  Hyperlipidemia No date: Hypertension No date: Migraine No date: Obesity No date: PONV (postoperative nausea and vomiting) 04/2015: Syncope No date: Vitamin D deficiency 02/23/2019: Yeast vaginitis     Comment:  Candida albicans on culture  Past Surgical History: No date: APPENDECTOMY No date: BUNIONECTOMY No date: CESAREAN SECTION     Comment:  X2 12/31/2020: COLONOSCOPY WITH PROPOFOL; N/A     Comment:  Procedure: COLONOSCOPY WITH PROPOFOL. eleview and spot               ink tattoo used.;  Surgeon: Virgel Manifold, MD;                Location: [place];  Service: Endoscopy;                Laterality: N/A; No date: DIAGNOSTIC LAPAROSCOPY No date: ENDOMETRIAL BIOPSY 01/23/2021: ENTEROSCOPY; N/A     Comment:  Procedure: ENTEROSCOPY;  Surgeon: Virgel Manifold,               MD;  Location: [facility];  Service: Endoscopy;                Laterality: N/A; 02/04/2021: ENTEROSCOPY; N/A     Comment:  Procedure: ENTEROSCOPY;  Surgeon: Ladene Artist, MD;               [facility];  Service: Gastroenterology;                Laterality: N/A; 12/31/2020: ESOPHAGOGASTRODUODENOSCOPY (EGD)  WITH PROPOFOL; N/A     Comment:  Procedure: ESOPHAGOGASTRODUODENOSCOPY (EGD) WITH               PROPOFOL;  Surgeon: Tahiliani, Varnita B, MD;  Location:               [facility];  Service: Endoscopy;  Laterality:               N/A; 02/04/2021: FOREIGN BODY REMOVAL     Comment:  Procedure: FOREIGN BODY REMOVAL;  Surgeon: Stark,               Malcolm T, MD;  Location: [facility];  Service:               Gastroenterology;;  staples from other gastric procedure No date: GASTRIC BYPASS 02/19/2021: GIVENS CAPSULE STUDY; N/A     Comment:  Procedure: GIVENS CAPSULE STUDY;  Surgeon: Tahiliani,               Varnita B, MD;  Location: [facility];  Service:               Endoscopy;  Laterality: N/A; No date: LASIK No date: MOUTH SURGERY     Comment:  Tongue  Cyst 06/29/2015: NOVASURE ABLATION; N/A     Comment:  Procedure: DILATION AND CURETTAGE, HYSTEROSCOPY,               NOVASURE ABLATION;  Surgeon: Andreas Staebler, MD;                Location: [facility];  Service: Gynecology;  Laterality:               N/A; No date: TUBAL LIGATION No date: WISDOM TOOTH EXTRACTION  BMI    Body Mass Index: 42.37 kg/m      Reproductive/Obstetrics negative OB ROS                             Anesthesia Physical Anesthesia Plan  ASA: 3  Anesthesia Plan: General   Post-op Pain Management:    Induction: Intravenous  PONV Risk Score and Plan: Propofol infusion and TIVA  Airway Management Planned: Natural Airway and Nasal Cannula  Additional Equipment:   Intra-op Plan:   Post-operative Plan:   Informed Consent: I have reviewed the patients History and Physical, chart, labs and discussed the procedure including the risks, benefits and alternatives for the proposed anesthesia with the patient or authorized representative who has indicated his/her understanding and acceptance.     Dental Advisory Given  Plan Discussed with: Anesthesiologist, CRNA and Surgeon  Anesthesia Plan Comments: (Patient consented for risks of anesthesia including but not limited to:  - adverse reactions to medications - risk of airway placement if required - damage to eyes, teeth, lips or other oral mucosa - nerve damage due to positioning  - sore throat or hoarseness - Damage to heart, brain, nerves, lungs, other parts of body or loss of life  Patient voiced understanding.)       Anesthesia Quick Evaluation  

## 2022-09-18 NOTE — Op Note (Signed)
[facility] Gastroenterology Patient Name: Jean Pitts Procedure Date: 09/18/2022 9:20 AM MRN: [id number] Account #: [id number] Date of Birth: November 28, 1985 Admit Type: Outpatient Age: 36 Room: [facility] OR ROOM 01 Gender: Female Note Status: Finalized Instrument Name: [id number] Procedure:             Colonoscopy Indications:           Surveillance: Piecemeal removal of large sessile                         adenoma last colonoscopy (< 3 yrs), Last colonoscopy:                         April 2022 Providers:             Lin Landsman MD, MD Referring MD:          Pleas Koch (Referring MD) Medicines:             General Anesthesia Complications:         No immediate complications. Estimated blood loss: None. Procedure:             Pre-Anesthesia Assessment:                        - Prior to the procedure, a History and Physical was                         performed, and patient medications and allergies were                         reviewed. The patient is competent. The risks and                         benefits of the procedure and the sedation options and                         risks were discussed with the patient. All questions                         were answered and informed consent was obtained.                         Patient identification and proposed procedure were                         verified by the physician, the nurse, the                         anesthesiologist, the anesthetist and the technician                         in the pre-procedure area in the procedure room in the                         endoscopy suite. Mental Status Examination: alert and                         oriented. Airway Examination: normal oropharyngeal  airway and neck mobility. Respiratory Examination:                         clear to auscultation. CV Examination: normal.                         Prophylactic Antibiotics: The patient does not  require                         prophylactic antibiotics. Prior Anticoagulants: The                         patient has taken no anticoagulant or antiplatelet                         agents. ASA Grade Assessment: II - A patient with mild                         systemic disease. After reviewing the risks and                         benefits, the patient was deemed in satisfactory                         condition to undergo the procedure. The anesthesia                         plan was to use general anesthesia. Immediately prior                         to administration of medications, the patient was                         re-assessed for adequacy to receive sedatives. The                         heart rate, respiratory rate, oxygen saturations,                         blood pressure, adequacy of pulmonary ventilation, and                         response to care were monitored throughout the                         procedure. The physical status of the patient was                         re-assessed after the procedure.                        After obtaining informed consent, the colonoscope was                         passed under direct vision. Throughout the procedure,                         the patient's blood pressure, pulse, and oxygen  saturations were monitored continuously. The was                         introduced through the anus and advanced to the the                         terminal ileum, with identification of the appendiceal                         orifice and IC valve. The colonoscopy was performed                         without difficulty. The patient tolerated the                         procedure well. The quality of the bowel preparation                         was evaluated using the BBPS [facility] Bowel Preparation                         Scale) with scores of: Right Colon = 3, Transverse                         Colon = 3 and Left Colon =  3 (entire mucosa seen well                         with no residual staining, small fragments of stool or                         opaque liquid). The total BBPS score equals 9. The                         terminal ileum, ileocecal valve, appendiceal orifice,                         and rectum were photographed. Findings:      The perianal and digital rectal examinations were normal. Pertinent       negatives include normal sphincter tone and no palpable rectal lesions.      An 8 mm polyp was found in the ascending colon. The polyp was flat.       Preparations were made for mucosal resection. Demarcation of the lesion       was performed with narrow band imaging to clearly identify the       boundaries of the lesion. Saline was injected to raise the lesion. Snare       mucosal resection was performed. Resection and retrieval were complete.       Resected tissue margins were examined and clear of polyp tissue.       Estimated blood loss: none.      A 5 mm polyp was found in the sigmoid colon. The polyp was sessile. The       polyp was removed with a cold snare. Resection and retrieval were       complete. Estimated blood loss: none.      The retroflexed view of the distal rectum and anal verge was normal and  showed no anal or rectal abnormalities.      The terminal ileum appeared normal.      A tattoo was seen in the proximal ascending colon. A post-polypectomy       scar was found at the tattoo site. There was no evidence of residual       polyp tissue. Impression:            - One 8 mm polyp in the ascending colon, removed with                         mucosal resection. Resected and retrieved.                        - One 5 mm polyp in the sigmoid colon, removed with a                         cold snare. Resected and retrieved.                        - The distal rectum and anal verge are normal on                         retroflexion view.                        - The examined  portion of the ileum was normal.                        - A tattoo was seen in the proximal ascending colon. A                         post-polypectomy scar was found at the tattoo site.                         There was no evidence of residual polyp tissue.                        - Mucosal resection was performed. Resection and                         retrieval were complete. Recommendation:        - Discharge patient to home (with escort).                        - Resume previous diet today.                        - Continue present medications.                        - Await pathology results.                        - Repeat colonoscopy in 5 years for surveillance. Procedure Code(s):     --- Professional ---                        [phone], Colonoscopy, flexible; with endoscopic mucosal  resection                        [id number], 82, Colonoscopy, flexible; with removal of                         tumor(s), polyp(s), or other lesion(s) by snare                         technique Diagnosis Code(s):     --- Professional ---                        Z86.010, Personal history of colonic polyps                        D12.2, Benign neoplasm of ascending colon                        D12.5, Benign neoplasm of sigmoid colon CPT copyright 2022 American Medical Association. All rights reserved. The codes documented in this report are preliminary and upon coder review may  be revised to meet current compliance requirements. Dr. Ulyess Mort Lin Landsman MD, MD 09/18/2022 9:54:32 AM This report has been signed electronically. Number of Addenda: 0 Note Initiated On: 09/18/2022 9:20 AM Scope Withdrawal Time: 0 hours 14 minutes 48 seconds  Total Procedure Duration: 0 hours 16 minutes 58 seconds  Estimated Blood Loss:  Estimated blood loss: none.      [facility]

## 2022-09-19 ENCOUNTER — Encounter: Payer: Self-pay | Admitting: Gastroenterology

## 2022-09-20 ENCOUNTER — Other Ambulatory Visit: Payer: Self-pay | Admitting: Primary Care

## 2022-09-20 DIAGNOSIS — E559 Vitamin D deficiency, unspecified: Secondary | ICD-10-CM

## 2022-09-23 LAB — SURGICAL PATHOLOGY

## 2022-09-24 ENCOUNTER — Encounter: Payer: Self-pay | Admitting: Gastroenterology

## 2022-09-26 ENCOUNTER — Telehealth: Payer: Self-pay

## 2022-09-26 DIAGNOSIS — R197 Diarrhea, unspecified: Secondary | ICD-10-CM

## 2022-09-26 NOTE — Telephone Encounter (Signed)
Patient stated that she had her colonoscopy last Thursday.  Since then she has been experiencing really bad abdominal pain, severe diarrhea, and nausea.  She also states that every thing she eats goes straight through her.  An additional concern for patient is the recommendation for repeat colonoscopy in 5 years.  She said she was wondering should she not have it repeated in 3 years since her 1st colonoscopy revealed a 85m pre-cancerous polyp, and this one has noted pre-cancerous polyp as well.  Please advise,  Thanks,  MSharyn Lull [place]

## 2022-09-26 NOTE — Telephone Encounter (Signed)
The recommendation for surveillance colonoscopy depends on the most recent colonoscopy.  She had only 1 precancerous polyp which was less than 1 cm.  This recommendation is based on the latest guidelines from [facility] GI profile PCR to rule out any infection for acute diarrhea  Sherri Sear, MD

## 2022-09-26 NOTE — Telephone Encounter (Signed)
Patient has been informed that the recommendations for survelillance colonoscopy depends on the most recent colonoscopy.  Since her most recent colonoscopy only showed 1 precancerous polyp less than 1 cm.  This recommendation is based on the latest guidelines from the [place].  In regards to diarrhea patient has been advised that Dr. Marius Ditch would like for her to have a GI Profile PCR to rule out any infection for acute diarrhea.  Pt has been asked to go to [facility] on [address] to have lab work done.  Thanks,  [place], [place]

## 2022-09-26 NOTE — Addendum Note (Signed)
Addended by: Vanetta Mulders on: 09/26/2022 11:19 AM   Modules accepted: Orders

## 2022-09-29 LAB — GI PROFILE, STOOL, PCR

## 2022-09-30 ENCOUNTER — Telehealth: Payer: Self-pay

## 2022-09-30 NOTE — Telephone Encounter (Signed)
Patient verbalized understanding of results  

## 2022-09-30 NOTE — Telephone Encounter (Signed)
-----   Message from Lin Landsman, MD sent at 09/30/2022  2:10 PM EST ----- Stool studies confirm that she had viral infection which explains her diarrhea.  Hope she is feeling better now  RV

## 2022-10-23 ENCOUNTER — Telehealth: Payer: Self-pay | Admitting: Neurology

## 2022-10-23 NOTE — Telephone Encounter (Signed)
LVM and sent MyChart message advising pt of appointment change- providers in meeting on 2/6.

## 2022-11-03 ENCOUNTER — Ambulatory Visit: Payer: BLUE CROSS/BLUE SHIELD | Admitting: Neurology

## 2022-11-03 DIAGNOSIS — G43709 Chronic migraine without aura, not intractable, without status migrainosus: Secondary | ICD-10-CM | POA: Diagnosis not present

## 2022-11-03 MED ORDER — ONABOTULINUMTOXINA 200 UNITS IJ SOLR
155.0000 [IU] | Freq: Once | INTRAMUSCULAR | Status: AC
  Administered 2022-11-03: 155 [IU] via INTRAMUSCULAR

## 2022-11-03 NOTE — Progress Notes (Signed)
Botox consent signed  Botox- 200 units x 1 vial Lot: [id number] Expiration: 02/2025 NDC: 5430-1484-03  Bacteriostatic 0.9% Sodium Chloride- 31m total Lot: [id number]: 11/25 NDC: 622300-979-49 Dx: GN71.820B/B

## 2022-11-03 NOTE — Progress Notes (Signed)
Consent Form Botulism Toxin Injection For Chronic Migraine   11/23/2022: > 50% improvement in migraine and headache freq and severity. +a 08/25/2022: First botox  Reviewed orally with patient, additionally signature is on file:  Botulism toxin has been approved by the Federal drug administration for treatment of chronic migraine. Botulism toxin does not cure chronic migraine and it may not be effective in some patients.  The administration of botulism toxin is accomplished by injecting a small amount of toxin into the muscles of the neck and head. Dosage must be titrated for each individual. Any benefits resulting from botulism toxin tend to wear off after 3 months with a repeat injection required if benefit is to be maintained. Injections are usually done every 3-4 months with maximum effect peak achieved by about 2 or 3 weeks. Botulism toxin is expensive and you should be sure of what costs you will incur resulting from the injection.  The side effects of botulism toxin use for chronic migraine may include:   -Transient, and usually mild, facial weakness with facial injections  -Transient, and usually mild, head or neck weakness with head/neck injections  -Reduction or loss of forehead facial animation due to forehead muscle weakness  -Eyelid drooping  -Dry eye  -Pain at the site of injection or bruising at the site of injection  -Double vision  -Potential unknown long term risks  Contraindications: You should not have Botox if you are pregnant, nursing, allergic to albumin, have an infection, skin condition, or muscle weakness at the site of the injection, or have myasthenia gravis, Lambert-Eaton syndrome, or ALS.  It is also possible that as with any injection, there may be an allergic reaction or no effect from the medication. Reduced effectiveness after repeated injections is sometimes seen and rarely infection at the injection site may occur. All care will be taken to prevent these  side effects. If therapy is given over a long time, atrophy and wasting in the muscle injected may occur. Occasionally the patient's become refractory to treatment because they develop antibodies to the toxin. In this event, therapy needs to be modified.  I have read the above information and consent to the administration of botulism toxin.    BOTOX PROCEDURE NOTE FOR MIGRAINE HEADACHE    Contraindications and precautions discussed with patient(above). Aseptic procedure was observed and patient tolerated procedure. Procedure performed by Dr. Georgia Dom  The condition has existed for more than 6 months, and pt does not have a diagnosis of ALS, Myasthenia Gravis or Lambert-Eaton Syndrome.  Risks and benefits of injections discussed and pt agrees to proceed with the procedure.  Written consent obtained  These injections are medically necessary. Pt  receives good benefits from these injections. These injections do not cause sedations or hallucinations which the oral therapies may cause.  Description of procedure:  The patient was placed in a sitting position. The standard protocol was used for Botox as follows, with 5 units of Botox injected at each site:   -Procerus muscle, midline injection  -Corrugator muscle, bilateral injection  -Frontalis muscle, bilateral injection, with 2 sites each side, medial injection was performed in the upper one third of the frontalis muscle, in the region vertical from the medial inferior edge of the superior orbital rim. The lateral injection was again in the upper one third of the forehead vertically above the lateral limbus of the cornea, 1.5 cm lateral to the medial injection site.  -Temporalis muscle injection, 4 sites, bilaterally. The first injection was 3  cm above the tragus of the ear, second injection site was 1.5 cm to 3 cm up from the first injection site in line with the tragus of the ear. The third injection site was 1.5-3 cm forward between the  first 2 injection sites. The fourth injection site was 1.5 cm posterior to the second injection site.   -Occipitalis muscle injection, 3 sites, bilaterally. The first injection was done one half way between the occipital protuberance and the tip of the mastoid process behind the ear. The second injection site was done lateral and superior to the first, 1 fingerbreadth from the first injection. The third injection site was 1 fingerbreadth superiorly and medially from the first injection site.  -Cervical paraspinal muscle injection, 2 sites, bilateral knee first injection site was 1 cm from the midline of the cervical spine, 3 cm inferior to the lower border of the occipital protuberance. The second injection site was 1.5 cm superiorly and laterally to the first injection site.  -Trapezius muscle injection was performed at 3 sites, bilaterally. The first injection site was in the upper trapezius muscle halfway between the inflection point of the neck, and the acromion. The second injection site was one half way between the acromion and the first injection site. The third injection was done between the first injection site and the inflection point of the neck.   Will return for repeat injection in 3 months.   200 units of Botox was used, 45 U Botox not injected was wasted. The patient tolerated the procedure well, there were no complications of the above procedure.

## 2022-11-03 NOTE — Telephone Encounter (Signed)
Noted  

## 2022-11-04 ENCOUNTER — Ambulatory Visit: Payer: BLUE CROSS/BLUE SHIELD | Admitting: Neurology

## 2022-11-05 ENCOUNTER — Ambulatory Visit: Payer: BLUE CROSS/BLUE SHIELD | Admitting: Neurology

## 2022-11-26 ENCOUNTER — Other Ambulatory Visit: Payer: Self-pay | Admitting: Primary Care

## 2022-11-26 DIAGNOSIS — G43009 Migraine without aura, not intractable, without status migrainosus: Secondary | ICD-10-CM

## 2022-11-27 NOTE — Telephone Encounter (Signed)
Patient is due for CPE/follow up in May, this will be required prior to any further refills.  Please schedule, thank you!

## 2022-11-27 NOTE — Telephone Encounter (Signed)
Patient has been scheduled

## 2022-11-30 ENCOUNTER — Other Ambulatory Visit: Payer: Self-pay | Admitting: Primary Care

## 2022-11-30 DIAGNOSIS — F331 Major depressive disorder, recurrent, moderate: Secondary | ICD-10-CM

## 2023-01-27 ENCOUNTER — Other Ambulatory Visit: Payer: Self-pay | Admitting: Adult Health

## 2023-01-27 ENCOUNTER — Other Ambulatory Visit: Payer: Self-pay | Admitting: Primary Care

## 2023-01-27 ENCOUNTER — Other Ambulatory Visit: Payer: Self-pay | Admitting: Obstetrics and Gynecology

## 2023-01-27 ENCOUNTER — Other Ambulatory Visit: Payer: Self-pay | Admitting: Neurology

## 2023-01-27 ENCOUNTER — Ambulatory Visit (INDEPENDENT_AMBULATORY_CARE_PROVIDER_SITE_OTHER): Payer: BLUE CROSS/BLUE SHIELD | Admitting: Neurology

## 2023-01-27 DIAGNOSIS — B3731 Acute candidiasis of vulva and vagina: Secondary | ICD-10-CM

## 2023-01-27 DIAGNOSIS — G4719 Other hypersomnia: Secondary | ICD-10-CM

## 2023-01-27 DIAGNOSIS — R519 Headache, unspecified: Secondary | ICD-10-CM

## 2023-01-27 DIAGNOSIS — G43009 Migraine without aura, not intractable, without status migrainosus: Secondary | ICD-10-CM

## 2023-01-27 DIAGNOSIS — G43709 Chronic migraine without aura, not intractable, without status migrainosus: Secondary | ICD-10-CM | POA: Diagnosis not present

## 2023-01-27 MED ORDER — ONABOTULINUMTOXINA 100 UNITS IJ SOLR
155.0000 [IU] | Freq: Once | INTRAMUSCULAR | Status: AC
  Administered 2023-01-27: 155 [IU] via INTRAMUSCULAR

## 2023-01-27 MED ORDER — NURTEC 75 MG PO TBDP: 75.0000 mg | ORAL_TABLET | Freq: Every day | ORAL | 11 refills | Status: DC | PRN

## 2023-01-27 NOTE — Progress Notes (Signed)
Consent Form Botulism Toxin Injection For Chronic Migraine  01/27/2023: botox working great, 4 total migraines a month and < 10 total headaches a month. Tried imitrex. Tried rizatriptan.prescribe Nurtec.   ESS = 20  Orders Placed This Encounter  Procedures   Ambulatory referral to Sleep Studies    Meds ordered this encounter  Medications   botulinum toxin Type A (BOTOX) injection 155 Units    Botox- 100 units x 2 vials Lot: [id number] Expiration: 02/2025 NDC: 4696-2952-84  Bacteriostatic 0.9% Sodium Chloride- 2 mL  Lot: [id number] Expiration: 07/2024 NDC: 02725-366-44  Dx: I34.742   B/B Witnessed by Delmer Islam   Rimegepant Sulfate (NURTEC) 75 MG TBDP    Sig: Take 1 tablet (75 mg total) by mouth daily as needed. For migraines. Take as close to onset of migraine as possible. One daily maximum.    Dispense:  16 tablet    Refill:  11    4 total migraines a month and < 10 total headaches a month. Tried imitrex. Tried rizatriptan     11/23/2022: > 50% improvement in migraine and headache freq and severity. +a 08/25/2022: First botox  Reviewed orally with patient, additionally signature is on file:  Botulism toxin has been approved by the Federal drug administration for treatment of chronic migraine. Botulism toxin does not cure chronic migraine and it may not be effective in some patients.  The administration of botulism toxin is accomplished by injecting a small amount of toxin into the muscles of the neck and head. Dosage must be titrated for each individual. Any benefits resulting from botulism toxin tend to wear off after 3 months with a repeat injection required if benefit is to be maintained. Injections are usually done every 3-4 months with maximum effect peak achieved by about 2 or 3 weeks. Botulism toxin is expensive and you should be sure of what costs you will incur resulting from the injection.  The side effects of botulism toxin use for chronic migraine may  include:   -Transient, and usually mild, facial weakness with facial injections  -Transient, and usually mild, head or neck weakness with head/neck injections  -Reduction or loss of forehead facial animation due to forehead muscle weakness  -Eyelid drooping  -Dry eye  -Pain at the site of injection or bruising at the site of injection  -Double vision  -Potential unknown long term risks  Contraindications: You should not have Botox if you are pregnant, nursing, allergic to albumin, have an infection, skin condition, or muscle weakness at the site of the injection, or have myasthenia gravis, Lambert-Eaton syndrome, or ALS.  It is also possible that as with any injection, there may be an allergic reaction or no effect from the medication. Reduced effectiveness after repeated injections is sometimes seen and rarely infection at the injection site may occur. All care will be taken to prevent these side effects. If therapy is given over a long time, atrophy and wasting in the muscle injected may occur. Occasionally the patient's become refractory to treatment because they develop antibodies to the toxin. In this event, therapy needs to be modified.  I have read the above information and consent to the administration of botulism toxin.    BOTOX PROCEDURE NOTE FOR MIGRAINE HEADACHE    Contraindications and precautions discussed with patient(above). Aseptic procedure was observed and patient tolerated procedure. Procedure performed by Dr. Artemio Aly  The condition has existed for more than 6 months, and pt does not have a diagnosis of ALS, Myasthenia Gravis or  Lambert-Eaton Syndrome.  Risks and benefits of injections discussed and pt agrees to proceed with the procedure.  Written consent obtained  These injections are medically necessary. Pt  receives good benefits from these injections. These injections do not cause sedations or hallucinations which the oral therapies may cause.  Description of  procedure:  The patient was placed in a sitting position. The standard protocol was used for Botox as follows, with 5 units of Botox injected at each site:   -Procerus muscle, midline injection  -Corrugator muscle, bilateral injection  -Frontalis muscle, bilateral injection, with 2 sites each side, medial injection was performed in the upper one third of the frontalis muscle, in the region vertical from the medial inferior edge of the superior orbital rim. The lateral injection was again in the upper one third of the forehead vertically above the lateral limbus of the cornea, 1.5 cm lateral to the medial injection site.  -Temporalis muscle injection, 4 sites, bilaterally. The first injection was 3 cm above the tragus of the ear, second injection site was 1.5 cm to 3 cm up from the first injection site in line with the tragus of the ear. The third injection site was 1.5-3 cm forward between the first 2 injection sites. The fourth injection site was 1.5 cm posterior to the second injection site.   -Occipitalis muscle injection, 3 sites, bilaterally. The first injection was done one half way between the occipital protuberance and the tip of the mastoid process behind the ear. The second injection site was done lateral and superior to the first, 1 fingerbreadth from the first injection. The third injection site was 1 fingerbreadth superiorly and medially from the first injection site.  -Cervical paraspinal muscle injection, 2 sites, bilateral knee first injection site was 1 cm from the midline of the cervical spine, 3 cm inferior to the lower border of the occipital protuberance. The second injection site was 1.5 cm superiorly and laterally to the first injection site.  -Trapezius muscle injection was performed at 3 sites, bilaterally. The first injection site was in the upper trapezius muscle halfway between the inflection point of the neck, and the acromion. The second injection site was one half way  between the acromion and the first injection site. The third injection was done between the first injection site and the inflection point of the neck.   Will return for repeat injection in 3 months.   200 units of Botox was used, 45 U Botox not injected was wasted. The patient tolerated the procedure well, there were no complications of the above procedure.

## 2023-01-27 NOTE — Progress Notes (Signed)
Botox- 100 units x 2 vials Lot: [id number] Expiration: 02/2025 NDC: 3086-5784-69  Bacteriostatic 0.9% Sodium Chloride- 2 mL  Lot: [id number] Expiration: 07/2024 NDC: 13244-010-27  Dx: O53.664   B/B

## 2023-01-27 NOTE — Progress Notes (Signed)
Epworth Sleepiness Scale 0= would never doze 1= slight chance of dozing 2= moderate chance of dozing 3= high chance of dozing  Sitting and reading:3  Watching TV:3 Sitting inactive in a public place (ex. Theater or meeting):2 As a passenger in a car for an hour without a break:3 Lying down to rest in the afternoon:3 Sitting and talking to someone:3 Sitting quietly after lunch (no alcohol):3 In a car, while stopped in traffic:0 Total: 20

## 2023-01-27 NOTE — Patient Instructions (Addendum)
Nurtec: as needed  Sleep test: ESS 20  Orders Placed This Encounter  Procedures   Ambulatory referral to Sleep Studies   Meds ordered this encounter  Medications   botulinum toxin Type A (BOTOX) injection 155 Units    Botox- 100 units x 2 vials Lot: [id number] Expiration: 02/2025 NDC: 5409-8119-14  Bacteriostatic 0.9% Sodium Chloride- 2 mL  Lot: [id number] Expiration: 07/2024 NDC: 13086-578-46  Dx: N62.952   B/B Witnessed by Delmer Islam   Rimegepant Sulfate (NURTEC) 75 MG TBDP    Sig: Take 1 tablet (75 mg total) by mouth daily as needed. For migraines. Take as close to onset of migraine as possible. One daily maximum.    Dispense:  16 tablet    Refill:  11    4 total migraines a month and < 10 total headaches a month. Tried imitrex. Tried rizatriptan       Sleep Apnea Sleep apnea is a condition in which breathing pauses or becomes shallow during sleep. People with sleep apnea usually snore loudly. They may have times when they gasp and stop breathing for 10 seconds or more during sleep. This may happen many times during the night. Sleep apnea disrupts your sleep and keeps your body from getting the rest that it needs. This condition can increase your risk of certain health problems, including: Heart attack. Stroke. Obesity. Type 2 diabetes. Heart failure. Irregular heartbeat. High blood pressure. The goal of treatment is to help you breathe normally again. What are the causes?  The most common cause of sleep apnea is a collapsed or blocked airway. There are three kinds of sleep apnea: Obstructive sleep apnea. This kind is caused by a blocked or collapsed airway. Central sleep apnea. This kind happens when the part of the brain that controls breathing does not send the correct signals to the muscles that control breathing. Mixed sleep apnea. This is a combination of obstructive and central sleep apnea. What increases the risk? You are more likely to develop this condition  if you: Are overweight. Smoke. Have a smaller than normal airway. Are older. Are female. Drink alcohol. Take sedatives or tranquilizers. Have a family history of sleep apnea. Have a tongue or tonsils that are larger than normal. What are the signs or symptoms? Symptoms of this condition include: Trouble staying asleep. Loud snoring. Morning headaches. Waking up gasping. Dry mouth or sore throat in the morning. Daytime sleepiness and tiredness. If you have daytime fatigue because of sleep apnea, you may be more likely to have: Trouble concentrating. Forgetfulness. Irritability or mood swings. Personality changes. Feelings of depression. Sexual dysfunction. This may include loss of interest if you are female, or erectile dysfunction if you are female. How is this diagnosed? This condition may be diagnosed with: A medical history. A physical exam. A series of tests that are done while you are sleeping (sleep study). These tests are usually done in a sleep lab, but they may also be done at home. How is this treated? Treatment for this condition aims to restore normal breathing and to ease symptoms during sleep. It may involve managing health issues that can affect breathing, such as high blood pressure or obesity. Treatment may include: Sleeping on your side. Using a decongestant if you have nasal congestion. Avoiding the use of depressants, including alcohol, sedatives, and narcotics. Losing weight if you are overweight. Making changes to your diet. Quitting smoking. Using a device to open your airway while you sleep, such as: An oral appliance. This is a  custom-made mouthpiece that shifts your lower jaw forward. A continuous positive airway pressure (CPAP) device. This device blows air through a mask when you breathe out (exhale). A nasal expiratory positive airway pressure (EPAP) device. This device has valves that you put into each nostril. A bi-level positive airway pressure  (BIPAP) device. This device blows air through a mask when you breathe in (inhale) and breathe out (exhale). Having surgery if other treatments do not work. During surgery, excess tissue is removed to create a wider airway. Follow these instructions at home: Lifestyle Make any lifestyle changes that your health care provider recommends. Eat a healthy, well-balanced diet. Take steps to lose weight if you are overweight. Avoid using depressants, including alcohol, sedatives, and narcotics. Do not use any products that contain nicotine or tobacco. These products include cigarettes, chewing tobacco, and vaping devices, such as e-cigarettes. If you need help quitting, ask your health care provider. General instructions Take over-the-counter and prescription medicines only as told by your health care provider. If you were given a device to open your airway while you sleep, use it only as told by your health care provider. If you are having surgery, make sure to tell your health care provider you have sleep apnea. You may need to bring your device with you. Keep all follow-up visits. This is important. Contact a health care provider if: The device that you received to open your airway during sleep is uncomfortable or does not seem to be working. Your symptoms do not improve. Your symptoms get worse. Get help right away if: You develop: Chest pain. Shortness of breath. Discomfort in your back, arms, or stomach. You have: Trouble speaking. Weakness on one side of your body. Drooping in your face. These symptoms may represent a serious problem that is an emergency. Do not wait to see if the symptoms will go away. Get medical help right away. Call your local emergency services (911 in the [place] Do not drive yourself to the hospital. Summary Sleep apnea is a condition in which breathing pauses or becomes shallow during sleep. The most common cause is a collapsed or blocked airway. The goal of  treatment is to restore normal breathing and to ease symptoms during sleep. This information is not intended to replace advice given to you by your health care provider. Make sure you discuss any questions you have with your health care provider. Document Revised: 04/24/2021 Document Reviewed: 08/24/2020 Elsevier Patient Education  2023 Elsevier Inc.     Rimegepant Disintegrating Tablets What is this medication? RIMEGEPANT (ri ME je pant) prevents and treats migraines. It works by blocking a substance in the body that causes migraines. This medicine may be used for other purposes; ask your health care provider or pharmacist if you have questions. COMMON BRAND NAME(S): NURTEC ODT What should I tell my care team before I take this medication? They need to know if you have any of these conditions: Kidney disease Liver disease An unusual or allergic reaction to rimegepant, other medications, foods, dyes, or preservatives Pregnant or trying to get pregnant Breast-feeding How should I use this medication? Take this medication by mouth. Take it as directed on the prescription label. Leave the tablet in the sealed pack until you are ready to take it. With dry hands, open the pack and gently remove the tablet. If the tablet breaks or crumbles, throw it away. Use a new tablet. Place the tablet in the mouth and allow it to dissolve. Then, swallow it. Do not  cut, crush, or chew this medication. You do not need water to take this medication. Talk to your care team about the use of this medication in children. Special care may be needed. Overdosage: If you think you have taken too much of this medicine contact a poison control center or emergency room at once. NOTE: This medicine is only for you. Do not share this medicine with others. What if I miss a dose? This does not apply. This medication is not for regular use. What may interact with this medication? Certain medications for fungal infections,  such as fluconazole, itraconazole Rifampin This list may not describe all possible interactions. Give your health care provider a list of all the medicines, herbs, non-prescription drugs, or dietary supplements you use. Also tell them if you smoke, drink alcohol, or use illegal drugs. Some items may interact with your medicine. What should I watch for while using this medication? Visit your care team for regular checks on your progress. Tell your care team if your symptoms do not start to get better or if they get worse. What side effects may I notice from receiving this medication? Side effects that you should report to your care team as soon as possible: Allergic reactions--skin rash, itching, hives, swelling of the face, lips, tongue, or throat Side effects that usually do not require medical attention (report to your care team if they continue or are bothersome): Nausea Stomach pain This list may not describe all possible side effects. Call your doctor for medical advice about side effects. You may report side effects to FDA at 1-800-FDA-1088. Where should I keep my medication? Keep out of the reach of children and pets. Store at room temperature between 20 and 25 degrees C (68 and 77 degrees F). Get rid of any unused medication after the expiration date. To get rid of medications that are no longer needed or have expired: Take the medication to a medication take-back program. Check with your pharmacy or law enforcement to find a location. If you cannot return the medication, check the label or package insert to see if the medication should be thrown out in the garbage or flushed down the toilet. If you are not sure, ask your care team. If it is safe to put it in the trash, take the medication out of the container. Mix the medication with cat litter, dirt, coffee grounds, or other unwanted substance. Seal the mixture in a bag or container. Put it in the trash. NOTE: This sheet is a summary. It  may not cover all possible information. If you have questions about this medicine, talk to your doctor, pharmacist, or health care provider.  2023 Elsevier/Gold Standard (2021-11-06 00:00:00)

## 2023-01-28 ENCOUNTER — Encounter: Payer: Self-pay | Admitting: Neurology

## 2023-01-28 NOTE — Telephone Encounter (Signed)
Received refill request for cyclobenzaprine that is prescribed to use as needed for migraines. I sent 30 pills on 11/27/22 to her pharmacy. Is she already out? How often is she using these now that she's been receiving Botox?

## 2023-01-29 ENCOUNTER — Telehealth: Payer: Self-pay | Admitting: *Deleted

## 2023-01-29 ENCOUNTER — Encounter: Payer: Self-pay | Admitting: Neurology

## 2023-01-29 ENCOUNTER — Encounter: Payer: Self-pay | Admitting: Obstetrics and Gynecology

## 2023-01-29 ENCOUNTER — Encounter: Payer: Self-pay | Admitting: *Deleted

## 2023-01-29 DIAGNOSIS — B3731 Acute candidiasis of vulva and vagina: Secondary | ICD-10-CM

## 2023-01-29 MED ORDER — FLUCONAZOLE 150 MG PO TABS: ORAL_TABLET | ORAL | 0 refills | Status: DC

## 2023-01-29 NOTE — Telephone Encounter (Signed)
Received fax stating Nurtec approved from 01/29/2023 through 01/29/2024  PA # [id number]. Approval letter faxed to pharmacy. Received a receipt of confirmation.

## 2023-01-29 NOTE — Telephone Encounter (Signed)
[employer] window tint form and signed letter placed up front for pt's husband (on DPR) to pickup.

## 2023-01-29 NOTE — Telephone Encounter (Signed)
Completed Nurtec PA on CMM. Approved by [employer] but no dates have been received  yet.

## 2023-01-29 NOTE — Telephone Encounter (Signed)
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 Dr Lucia Gaskins also signed the permit for window tinting. Both forms are ready to be returned to the patient. We can email to her if she likes.

## 2023-02-02 NOTE — Telephone Encounter (Signed)
Pa needed resending to rx/pa/team again

## 2023-02-04 ENCOUNTER — Ambulatory Visit (INDEPENDENT_AMBULATORY_CARE_PROVIDER_SITE_OTHER): Payer: BLUE CROSS/BLUE SHIELD | Admitting: Primary Care

## 2023-02-04 ENCOUNTER — Encounter: Payer: Self-pay | Admitting: Primary Care

## 2023-02-04 VITALS — BP 126/84 | HR 100 | Temp 98.1°F | Ht 63.5 in | Wt 248.0 lb

## 2023-02-04 DIAGNOSIS — Z0001 Encounter for general adult medical examination with abnormal findings: Secondary | ICD-10-CM

## 2023-02-04 DIAGNOSIS — F411 Generalized anxiety disorder: Secondary | ICD-10-CM | POA: Diagnosis not present

## 2023-02-04 DIAGNOSIS — R41840 Attention and concentration deficit: Secondary | ICD-10-CM

## 2023-02-04 DIAGNOSIS — Z9884 Bariatric surgery status: Secondary | ICD-10-CM

## 2023-02-04 DIAGNOSIS — Z23 Encounter for immunization: Secondary | ICD-10-CM

## 2023-02-04 DIAGNOSIS — E785 Hyperlipidemia, unspecified: Secondary | ICD-10-CM

## 2023-02-04 DIAGNOSIS — Z6841 Body Mass Index (BMI) 40.0 and over, adult: Secondary | ICD-10-CM

## 2023-02-04 DIAGNOSIS — E559 Vitamin D deficiency, unspecified: Secondary | ICD-10-CM

## 2023-02-04 DIAGNOSIS — N760 Acute vaginitis: Secondary | ICD-10-CM

## 2023-02-04 DIAGNOSIS — G43709 Chronic migraine without aura, not intractable, without status migrainosus: Secondary | ICD-10-CM

## 2023-02-04 DIAGNOSIS — E282 Polycystic ovarian syndrome: Secondary | ICD-10-CM

## 2023-02-04 DIAGNOSIS — F331 Major depressive disorder, recurrent, moderate: Secondary | ICD-10-CM

## 2023-02-04 LAB — LIPID PANEL
Cholesterol: 202 mg/dL — ABNORMAL HIGH (ref 0–200)
HDL: 43.8 mg/dL (ref >39.00)
LDL Cholesterol: 132 mg/dL — ABNORMAL HIGH (ref 0–99)
NonHDL: 158.14
Total CHOL/HDL Ratio: 5
Triglycerides: 131 mg/dL (ref 0.0–149.0)
VLDL: 26.2 mg/dL (ref 0.0–40.0)

## 2023-02-04 LAB — VITAMIN D 25 HYDROXY (VIT D DEFICIENCY, FRACTURES): VITD: 28.02 ng/mL — ABNORMAL LOW (ref 30.00–100.00)

## 2023-02-04 NOTE — Assessment & Plan Note (Signed)
Repeat lipid panel pending. 

## 2023-02-04 NOTE — Assessment & Plan Note (Signed)
Following with bariatric surgery. Reviewed labs from March 2024.

## 2023-02-04 NOTE — Assessment & Plan Note (Signed)
Tetanus due, provided today. Pap smear UTD.  Discussed the importance of a healthy diet and regular exercise in order for weight loss, and to reduce the risk of further co-morbidity.  Exam stable. Labs pending.  Follow up in 1 year for repeat physical.

## 2023-02-04 NOTE — Assessment & Plan Note (Signed)
Following with GYN.  Continue Fluconazole 150 mg PRN.

## 2023-02-04 NOTE — Assessment & Plan Note (Signed)
Repeat vitamin D level pending. Continue vitamin D 50,000 IU weekly and multivitamin.

## 2023-02-04 NOTE — Progress Notes (Signed)
Subjective:    Patient ID: Jean Pitts, female    DOB: 10-30-1985, 37 y.o.   MRN: [id number]  HPI  Jean Pitts is a very pleasant 37 y.o. female who presents today for complete physical and follow up of chronic conditions.  She would like to discuss ongoing anxiety. She continues to notice feeling anxious, feeling overwhelmed, constantly irritable, worrying,fatigue, low motivation to do things. She has noticed improvement on fluoxetine 20 mg, but questions if she needs a dose adjustment. She would also like testing for ADHD for symptoms of difficulty focusing. Chronic history, loses focus often.    Immunizations: -Tetanus: Completed in 2013, due today.  Diet: Fair diet.  Exercise: No regular exercise.  Eye exam: Completes annually  Dental exam: Completes semi-annually    Pap Smear: Follows with GYN.   BP Readings from Last 3 Encounters:  02/04/23 126/84  09/18/22 133/73  08/13/22 100/72       Review of Systems  Constitutional:  Negative for unexpected weight change.  HENT:  Negative for rhinorrhea.   Eyes:  Negative for visual disturbance.  Respiratory:  Negative for cough and shortness of breath.   Cardiovascular:  Negative for chest pain.  Gastrointestinal:  Negative for constipation and diarrhea.  Genitourinary:  Negative for difficulty urinating.  Musculoskeletal:  Negative for arthralgias and myalgias.  Skin:  Negative for rash.  Allergic/Immunologic: Positive for environmental allergies.  Neurological:  Positive for headaches. Negative for dizziness.  Psychiatric/Behavioral:  Positive for decreased concentration. The patient is nervous/anxious.          Past Medical History:  Diagnosis Date   Abnormal glucose tolerance test in pregnancy, antepartum 06/04/2012   Formatting of this note might be different from the original. Needs 3h GTT   Absolute anemia 10/22/2012   Anemia    B12 deficiency    Black tarry stools 12/20/2020   BRCA negative  10/2021   MyRisk neg   Family history of adverse reaction to anesthesia    MATERNAL AUNT-PT UNAWARE OF WHAT HAPPENED WITH ANESTHESIA BUT STATES HER AUNT HAS HAD PROBLEMS IN PAST    Family history of breast cancer 10/2021   IBIS=12.9%/riskscore=17.1%   Family history of ovarian cancer    Fecal occult blood test positive    Generalized abdominal pain 12/20/2020   Genital herpes    GERD (gastroesophageal reflux disease)    H/O cesarean section complicating pregnancy 04/19/2012   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/19/2021   Hair thinning 05/12/2018   Headache    MIGRAINE   Heartburn 10/26/2018   History of Roux-en-Y gastric bypass    Hyperlipidemia    Hypertension    Migraine    Obesity    PONV (postoperative nausea and vomiting)    Syncope 04/2015   Vitamin D deficiency    Yeast vaginitis 02/23/2019   Candida albicans on culture    Social History   Socioeconomic History   Marital status: Married    Spouse name: Not on file   Number of children: 2   Years of education: Not on file   Highest education level: Some college, no degree  Occupational History   Occupation: Immunologist   Tobacco Use   Smoking status: Never   Smokeless tobacco: Never  Vaping Use   Vaping Use: Never used  Substance and Sexual Activity   Alcohol use: Not Currently    Comment: RARE   Drug use: No   Sexual activity: Yes    Partners: Male  Birth control/protection: Surgical    Comment: Ablation  Other Topics Concern   Not on file  Social History Narrative      She also goes to school full time at [employer]   Social Determinants of Health   Financial Resource Strain: Unknown (05/12/2018)   Overall Financial Resource Strain (CARDIA)    Difficulty of Paying Living Expenses: Patient declined  Food Insecurity: No Food Insecurity (05/12/2018)   Hunger Vital Sign    Worried About Running Out of Food in the Last Year: Never true    Ran Out of Food in the Last Year: Never true  Transportation Needs:  No Transportation Needs (05/12/2018)   PRAPARE - Administrator, Civil Service (Medical): No    Lack of Transportation (Non-Medical): No  Physical Activity: Inactive (05/12/2018)   Exercise Vital Sign    Days of Exercise per Week: 0 days    Minutes of Exercise per Session: 0 min  Stress: No Stress Concern Present (05/12/2018)   [employer] of Occupational Health - Occupational Stress Questionnaire    Feeling of Stress : Not at all  Social Connections: Unknown (05/12/2018)   Social Connection and Isolation Panel [NHANES]    Frequency of Communication with Friends and Family: More than three times a week    Frequency of Social Gatherings with Friends and Family: More than three times a week    Attends Religious Services: More than 4 times per year    Active Member of [employer] or Organizations: No    Attends Banker Meetings: Never    Marital Status: Not on file  Intimate Partner Violence: Not At Risk (05/12/2018)   Humiliation, Afraid, Rape, and Kick questionnaire    Fear of Current or Ex-Partner: No    Emotionally Abused: No    Physically Abused: No    Sexually Abused: No    Past Surgical History:  Procedure Laterality Date   APPENDECTOMY     BUNIONECTOMY     CESAREAN SECTION     X2   COLONOSCOPY WITH PROPOFOL N/A 12/31/2020   Procedure: COLONOSCOPY WITH PROPOFOL. eleview and spot ink tattoo used.;  Surgeon: Pasty Spillers, MD;  Location: [facility];  Service: Endoscopy;  Laterality: N/A;   COLONOSCOPY WITH PROPOFOL N/A 09/18/2022   Procedure: COLONOSCOPY WITH PROPOFOL;  Surgeon: Toney Reil, MD;  Location: [facility];  Service: Endoscopy;  Laterality: N/A;   DIAGNOSTIC LAPAROSCOPY     ENDOMETRIAL BIOPSY     ENTEROSCOPY N/A 01/23/2021   Procedure: ENTEROSCOPY;  Surgeon: Pasty Spillers, MD;  Location: [facility];  Service: Endoscopy;  Laterality: N/A;   ENTEROSCOPY N/A 02/04/2021   Procedure: ENTEROSCOPY;  Surgeon:  Meryl Dare, MD;  Location: [facility];  Service: Gastroenterology;  Laterality: N/A;   ESOPHAGOGASTRODUODENOSCOPY (EGD) WITH PROPOFOL N/A 12/31/2020   Procedure: ESOPHAGOGASTRODUODENOSCOPY (EGD) WITH PROPOFOL;  Surgeon: Pasty Spillers, MD;  Location: [facility];  Service: Endoscopy;  Laterality: N/A;   FOREIGN BODY REMOVAL  02/04/2021   Procedure: FOREIGN BODY REMOVAL;  Surgeon: Meryl Dare, MD;  Location: [facility];  Service: Gastroenterology;;  staples from other gastric procedure   GASTRIC BYPASS     GIVENS CAPSULE STUDY N/A 02/19/2021   Procedure: GIVENS CAPSULE STUDY;  Surgeon: Pasty Spillers, MD;  Location: [facility];  Service: Endoscopy;  Laterality: N/A;   LASIK     MOUTH SURGERY     Tongue Cyst   NOVASURE ABLATION N/A 06/29/2015  Procedure: DILATION AND CURETTAGE, HYSTEROSCOPY, NOVASURE ABLATION;  Surgeon: Vena Austria, MD;  Location: [facility];  Service: Gynecology;  Laterality: N/A;   POLYPECTOMY  09/18/2022   Procedure: POLYPECTOMY;  Surgeon: Toney Reil, MD;  Location: [facility];  Service: Endoscopy;;   TUBAL LIGATION     WISDOM TOOTH EXTRACTION      Family History  Problem Relation Age of Onset   Hemangiomas Mother        benign osseous 2022 MRI   Migraines Mother    Obesity Mother    Hyperlipidemia Mother    Obesity Sister    Obesity Sister    Obesity Brother    Ovarian cancer Maternal Grandmother 26       Cervical/Sqammacell   Breast cancer Maternal Aunt    Neuropathy Maternal Uncle    Breast cancer Maternal Great-grandmother    Skin cancer Maternal Great-grandmother     Allergies  Allergen Reactions   Augmentin [Amoxicillin-Pot Clavulanate]     diarrhea   Codeine Other (See Comments)    Other reaction(s): Other (See Comments)   Hydrocodone-Acetaminophen Nausea And Vomiting    Current Outpatient Medications on File Prior to Visit  Medication Sig Dispense Refill   Botulinum Toxin Type A (BOTOX)  200 units SOLR Provider to inject 155 units into the muscles of the head and neck every 3 months. Discard remainder. 1 each 3   cyclobenzaprine (FLEXERIL) 5 MG tablet TAKE 1 TABLET BY MOUTH ONCE DAILY AS NEEDED FOR MIGRAINES. 30 tablet 0   fluconazole (DIFLUCAN) 150 MG tablet Take 1 tab once weekly as preventive for 3 months 15 tablet 0   FLUoxetine (PROZAC) 20 MG tablet TAKE 1 TABLET (20 MG TOTAL) BY MOUTH DAILY. FOR ANXIETY AND DEPRESSION. 90 tablet 0   NURTEC 75 MG TBDP TAKE 1 TABLET (75 MG TOTAL) BY MOUTH DAILY AS NEEDED. FOR MIGRAINES. TAKE AS CLOSE TO ONSET OF MIGRAINE AS POSSIBLE. ONE DAILY MAXIMUM. 16 tablet 11   ondansetron (ZOFRAN) 4 MG tablet Take 1 tablet (4 mg total) by mouth every 8 (eight) hours as needed for up to 20 doses for nausea or vomiting. 20 tablet 0   SUMAtriptan (IMITREX) 6 MG/0.5ML SOLN injection INJECT 6 MG INTO THE SKIN AT MIGRAINE ONSET, MAY REPEAT WITH 6 MG 2 HOURS LATER IF MIGRAINE PERSISTS. MAX 2 DOSES PER 24 HOURS. 5 mL 5   SUMAtriptan Succinate Refill 6 MG/0.5ML SOCT INJECT 6 MG INTO THE SKIN AT MIGRAINE ONSET, MAY REPEAT WITH 6 MG 2 HOURS LATER IF MIGRAINE PERSISTS. 0.5 mL 2   Syringe/Needle, Disp, (BD SAFETYGLIDE SYRINGE/NEEDLE) 27G X 5/8" 1 ML MISC Attach needle to syringe and use to draw up and administer a single dose of Sumatriptan into the skin. Do not reuse. 10 each 5   Vitamin D, Ergocalciferol, (DRISDOL) 1.25 MG (50000 UNIT) CAPS capsule TAKE 1 CAPSULE (50,000 UNITS TOTAL) BY MOUTH EVERY 7 (SEVEN) DAYS. FOR VITAMIN D. 12 capsule 0   No current facility-administered medications on file prior to visit.    BP 126/84   Pulse 100   Temp 98.1 F (36.7 C) (Temporal)   Ht 5' 3.5" (1.613 m)   Wt 248 lb (112.5 kg)   SpO2 97%   BMI 43.24 kg/m  Objective:   Physical Exam HENT:     Right Ear: Tympanic membrane and ear canal normal.     Left Ear: Tympanic membrane and ear canal normal.     Nose: Nose normal.  Eyes:  Conjunctiva/sclera: Conjunctivae  normal.     Pupils: Pupils are equal, round, and reactive to light.  Neck:     Thyroid: No thyromegaly.  Cardiovascular:     Rate and Rhythm: Normal rate and regular rhythm.     Heart sounds: No murmur heard. Pulmonary:     Effort: Pulmonary effort is normal.     Breath sounds: Normal breath sounds. No rales.  Abdominal:     General: Bowel sounds are normal.     Palpations: Abdomen is soft.     Tenderness: There is no abdominal tenderness.  Musculoskeletal:        General: Normal range of motion.     Cervical back: Neck supple.  Lymphadenopathy:     Cervical: No cervical adenopathy.  Skin:    General: Skin is warm and dry.     Findings: No rash.  Neurological:     Mental Status: She is alert and oriented to person, place, and time.     Cranial Nerves: No cranial nerve deficit.     Deep Tendon Reflexes: Reflexes are normal and symmetric.  Psychiatric:        Mood and Affect: Mood normal.           Assessment & Plan:  Encounter for annual general medical examination with abnormal findings in adult Assessment & Plan: Tetanus due, provided today. Pap smear UTD.  Discussed the importance of a healthy diet and regular exercise in order for weight loss, and to reduce the risk of further co-morbidity.  Exam stable. Labs pending.  Follow up in 1 year for repeat physical.    Chronic migraine without aura without status migrainosus, not intractable Assessment & Plan: Following with neurology.  Continue Botox injections. Continue Nurtec 75 mg every other day and then as needed. Continue Imitrex 6 mg injections PRN. Continue cyclobenzaprine 5 mg PRN. Continue Zofran 4 mg PRN.  Sleep study pending.   Recurrent vaginitis Assessment & Plan: Following with GYN.  Continue Fluconazole 150 mg PRN.    GAD (generalized anxiety disorder) Assessment & Plan: Uncontrolled.  Increase to fluoxetine 40 mg daily. She will update.  Orders: -     Ambulatory referral to  Psychology  Moderate episode of recurrent major depressive disorder (HCC) -     Ambulatory referral to Psychology  Difficulty concentrating Assessment & Plan: Suspect more anxiety than ADHD, but will refer for ADHD testing. Increase fluoxetine to 40 mg daily.    Orders: -     Ambulatory referral to Psychology  Vitamin D deficiency Assessment & Plan: Repeat vitamin D level pending. Continue vitamin D 50,000 IU weekly and multivitamin.  Orders: -     VITAMIN D 25 Hydroxy (Vit-D Deficiency, Fractures)  Morbid obesity with BMI of 40.0-44.9, adult (HCC) -     Lipid panel  H/O gastric bypass Assessment & Plan: Following with bariatric surgery. Reviewed labs from March 2024.   Hyperlipidemia, unspecified hyperlipidemia type Assessment & Plan: Repeat lipid panel pending.           Doreene Nest, NP

## 2023-02-04 NOTE — Assessment & Plan Note (Signed)
Uncontrolled.  Increase to fluoxetine 40 mg daily. She will update.

## 2023-02-04 NOTE — Assessment & Plan Note (Addendum)
Following with neurology.  Continue Botox injections. Continue Nurtec 75 mg every other day and then as needed. Continue Imitrex 6 mg injections PRN. Continue cyclobenzaprine 5 mg PRN. Continue Zofran 4 mg PRN.  Sleep study pending.

## 2023-02-04 NOTE — Assessment & Plan Note (Signed)
Suspect more anxiety than ADHD, but will refer for ADHD testing. Increase fluoxetine to 40 mg daily.

## 2023-02-04 NOTE — Patient Instructions (Signed)
Stop by the lab prior to leaving today. I will notify you of your results once received.   Increase your fluoxetine to 40 mg daily. Take two of the 20 mg tablets.   You will either be contacted via phone regarding your referral for ADHD testing, or you may receive a letter on your MyChart portal from our referral team with instructions for scheduling an appointment. Please let us know if you have not been contacted by anyone within two weeks.  It was a pleasure to see you today!

## 2023-02-16 ENCOUNTER — Encounter: Payer: Self-pay | Admitting: Neurology

## 2023-02-16 ENCOUNTER — Ambulatory Visit: Payer: BLUE CROSS/BLUE SHIELD | Admitting: Neurology

## 2023-02-16 VITALS — BP 126/79 | HR 85 | Ht 63.0 in | Wt 247.8 lb

## 2023-02-16 DIAGNOSIS — Z9884 Bariatric surgery status: Secondary | ICD-10-CM | POA: Diagnosis not present

## 2023-02-16 DIAGNOSIS — G43709 Chronic migraine without aura, not intractable, without status migrainosus: Secondary | ICD-10-CM | POA: Diagnosis not present

## 2023-02-16 DIAGNOSIS — E662 Morbid (severe) obesity with alveolar hypoventilation: Secondary | ICD-10-CM

## 2023-02-16 DIAGNOSIS — Z6841 Body Mass Index (BMI) 40.0 and over, adult: Secondary | ICD-10-CM

## 2023-02-16 DIAGNOSIS — R519 Headache, unspecified: Secondary | ICD-10-CM

## 2023-02-16 DIAGNOSIS — G4719 Other hypersomnia: Secondary | ICD-10-CM

## 2023-02-16 DIAGNOSIS — Z8719 Personal history of other diseases of the digestive system: Secondary | ICD-10-CM

## 2023-02-16 NOTE — Patient Instructions (Signed)
ASSESSMENT AND PLAN 37 y.o. year old female  here with:    1) Migraine chronic , without aura-  and with morning headaches daily ( 5/ 7 days) , cluster night time sleep arousals, 5 /months. These symptoms deserve to be evaluated for hypoxia.   2) excessive daytime sleepiness with long sleep time and sleep inertia. No vivid dreams, no sleep paralysis.   3) Sleep is fragmented for various reasons. Husband told her she doesn't snore or only mildly .  Work up for Hypersomnia , with prolonged sleep time,  Delayed sleep phase, circadian rhythm disorder  Fatigue was exacerbated after gastric bleed in 2022, history of Roux and Y surgery 10 years ago. Blood loss anemia was extremely fatiguing.  Had some RLS after that , but now infrequent.    I would like to order a two prong approach,  the patient will need screening for sleep apnea and for anemia, ferritin, TIBC , continue Vit D and B12 supplements ( bariatric Vitamins) .    I plan to follow up either personally or through our NP within 3-4 months.    Hypersomnia Hypersomnia is a condition in which a person feels very tired during the day even though the person gets plenty of sleep at night. A person with this condition may take naps during the day and may find it very difficult to wake up from sleep. Hypersomnia may affect a person's ability to think, concentrate, drive, or remember things. What are the causes? The cause of this condition may not be known. Possible causes include: Taking certain medicines. Using drugs or alcohol. Sleep disorders, such as narcolepsy and sleep apnea. Injury to the head, brain, or spinal cord. Tumors. Certain medical conditions. These include: Depression. Diabetes. Gastroesophageal reflux disease (GERD). An underactive thyroid gland (hypothyroidism). What are the signs or symptoms? The main symptoms of hypersomnia include: Feeling very tired throughout the day, regardless of how much sleep you got the  night before. Having trouble waking up. Others may find it difficult to wake you up when you are sleeping. Sleeping for longer and longer periods at a time. Taking naps throughout the day. Other symptoms may include: Feeling restless, anxious, or annoyed. Lacking energy. Having trouble with: Remembering. Speaking. Thinking. Loss of appetite. Seeing, hearing, tasting, smelling, or feeling things that are not real (hallucinations). How is this diagnosed? This condition may be diagnosed based on: Your symptoms and medical history. Your sleeping habits. Your health care provider may ask you to write down your sleeping habits in a daily sleep log, along with any symptoms you have. A series of tests that are done while you sleep (sleep study or polysomnogram). A test that measures how quickly you can fall asleep during the day (daytime nap study or multiple sleep latency test). How is this treated? This condition may be treated by: Following a regular sleep routine. Making lifestyle changes, such as changing your eating habits, getting regular exercise, and avoiding alcohol or caffeinated beverages. Taking medicines to make you more alert (stimulants) during the day. Treating any underlying medical causes of hypersomnia. Follow these instructions at home: Sleep habits Stick to a routine that includes going to bed and waking up at the same times every day and night. Practice a relaxing bedtime routine. This may include reading, meditation, deep breathing, or taking a warm bath before going to sleep. Exercise regularly as told by your health care provider. However, avoid exercising in the hours right before bedtime. Keep your sleep environment at  a cooler temperature, darkened, and quiet. Sleep with pillows and a mattress that are comfortable and supportive. Schedule short 20-minute naps for when you feel sleepiest during the day. Talk with your employer or teachers about your hypersomnia.  If possible, adjust your schedule so that: You have a regular daytime work schedule. You can take a scheduled nap during the day. You do not have to work or be active at night. Do not eat a heavy meal for a few hours before bedtime. Eat your meals at about the same times every day. Safety  Do not drive or use machinery if you are sleepy. Ask your health care provider if it is safe for you to drive. Wear a life jacket when swimming or spending time near water. General instructions  Take over-the-counter and prescription medicines only as told by your health care provider. This includes supplements. Avoid drinking alcohol or caffeinated beverages. Keep a sleep log that will help your health care provider manage your condition. This may include information about: What time you go to bed each night. How often you wake up at night. How many hours you sleep at night. How often and for how long you nap during the day. Any observations from others, such as leg movements during sleep, sleep walking, or snoring. Keep all follow-up visits. This is important. Contact a health care provider if: You have new symptoms. Your symptoms get worse. Get help right away if: You have thoughts about hurting yourself or someone else. Get help right away if you feel like you may hurt yourself or others, or have thoughts about taking your own life. Go to your nearest emergency room or: Call 911. Call the National Suicide Prevention Lifeline at (320)160-6640 or 988. This is open 24 hours a day. Text the Crisis Text Line at (920)699-3147. Summary Hypersomnia refers to a condition in which you feel very tired during the day even though you get plenty of sleep at night. A person with this condition may take naps during the day and may find it very difficult to wake up from sleep. Hypersomnia may affect a person's ability to think, concentrate, drive, or remember things. Treatment may include a regular sleep routine and  making some lifestyle changes. This information is not intended to replace advice given to you by your health care provider. Make sure you discuss any questions you have with your health care provider. Document Revised: 08/26/2021 Document Reviewed: 08/26/2021 Elsevier Patient Education  2023 [employer].

## 2023-02-16 NOTE — Progress Notes (Signed)
SLEEP MEDICINE CLINIC    Provider:  Melvyn Novas, MD  Primary Care Physician:  Doreene Nest, NP [address] [place] [address]     Referring Provider: Anson Fret, Md [address] [place],  [place] [address]          Chief Complaint according to patient   Patient presents with:     New Patient (Initial Visit)           HISTORY OF PRESENT ILLNESS:  Jean Pitts is a 37 y.o. female patient who is seen upon referral on 02/16/2023 from Dr Lucia Gaskins for a Sleep consultation.  Chief concern according to patient : I have the pleasure of seeing Jean Pitts 02/16/23  who present with excessive daytime sleepiness, with morning headaches and with nocturnal headaches that wake her out of sleep.  Morning headaches are dull and throbbing, arising from the forehead. Sometimes these evolve into migraines over the day. Those HA that wake her are more piercing and sharp, stabbing above the bridge of the nose.  The sleepiness has evolved after her pregnancies and worsened over the last 2-3 years.  " I always wanted to nap, but now its imperative - I can't resist it- and I work from home".  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 She also struggles with weight.  She suffered a GI bleed  12-2020 after gastric bypass in 2014 at [facility], had severe anemia, iron deficiency. 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   The patient had no previous sleep studies. No nocturia, no sleepwalking, but son is a sleep walker.   No ENT injuries, surgeries, trauma or thyroid condition.    Family medical /sleep history: NO other family member on CPAP with OSA, nobody with insomnia, son was a childhood sleep walkers.    Social history:  Patient is working as Environmental health practitioner, [employer], groceries.  A lot of overtime- and lives in a household with husband and  3 children,  1 dog.  The patient currently works from home . Tobacco  use, none .  ETOH use : seldomly - causes migraines ( sulfate  is a trigger" ) Caffeine intake in form of Coffee( /) Soda( 2/ d) Tea ( /) several energy drinks 2-3 a day.  Exercise : too fatigued. .   Hobbies :      Sleep habits are as follows: The patient's dinner time is between 7-9 PM. The patient goes to bed at 10-12 PM and continues to sleep for 6-7 hours, wakes for unknown reasons, but she look at the clock.  Headaches are a cause, not bathroom breaks. Husband snores. Bedroom is cool, quiet and dark.    The preferred sleep position is sideways, with the support of 2-3 pillows. No GERD.  Dreams are reportedly infrequent.  The patient wakes up with an alarm at 5.40 AM . MULTIPLE ALARMS.   7.00 AM is the usual rise time. She reports not feeling refreshed or restored in AM, with symptoms such as dry mouth, morning headaches, and residual fatigue.  Naps are taken frequently,  it can be around 3 PM- and now starts at 11-12 noon.l Lasting from 60 to 180 minutes and are not more refreshing and affecting nocturnal sleep.    Review of Systems: Out of a complete 14 system review, the patient complains of only the following symptoms,  and all other reviewed systems are negative.:  Fatigue, sleepiness , snoring, fragmented sleep,  Insomnia, delayed sleep phase.  Headache    How likely are you to doze in the following situations: 0 = not likely, 1 = slight chance, 2 = moderate chance, 3 = high chance   Sitting and Reading? Watching Television? Sitting inactive in a public place (theater or meeting)? As a passenger in a car for an hour without a break? Lying down in the afternoon when circumstances permit? Sitting and talking to someone? Sitting quietly after lunch without alcohol? In a car, while stopped for a few minutes in traffic?   Total = 18/ 24 points   FSS endorsed at 57/ 63 points.   Social History   Socioeconomic History   Marital status: Married    Spouse name: Not on file    Number of children: 2   Years of education: Not on file   Highest education level: Some college, no degree  Occupational History   Occupation: Immunologist   Tobacco Use   Smoking status: Never   Smokeless tobacco: Never  Vaping Use   Vaping Use: Never used  Substance and Sexual Activity   Alcohol use: Not Currently    Comment: RARE   Drug use: No   Sexual activity: Yes    Partners: Male    Birth control/protection: Surgical    Comment: Ablation  Other Topics Concern   Not on file  Social History Narrative      She also goes to school full time at [employer]   Social Determinants of Health   Financial Resource Strain: Unknown (05/12/2018)   Overall Financial Resource Strain (CARDIA)    Difficulty of Paying Living Expenses: Patient declined  Food Insecurity: No Food Insecurity (05/12/2018)   Hunger Vital Sign    Worried About Running Out of Food in the Last Year: Never true    Ran Out of Food in the Last Year: Never true  Transportation Needs: No Transportation Needs (05/12/2018)   PRAPARE - Administrator, Civil Service (Medical): No    Lack of Transportation (Non-Medical): No  Physical Activity: Inactive (05/12/2018)   Exercise Vital Sign    Days of Exercise per Week: 0 days    Minutes of Exercise per Session: 0 min  Stress: No Stress Concern Present (05/12/2018)   [employer] of Occupational Health - Occupational Stress Questionnaire    Feeling of Stress : Not at all  Social Connections: Unknown (05/12/2018)   Social Connection and Isolation Panel [NHANES]    Frequency of Communication with Friends and Family: More than three times a week    Frequency of Social Gatherings with Friends and Family: More than three times a week    Attends Religious Services: More than 4 times per year    Active Member of [employer] or Organizations: No    Attends Banker Meetings: Never    Marital Status: Not on file    Family History  Problem Relation Age  of Onset   Hemangiomas Mother        benign osseous 2022 MRI   Migraines Mother    Obesity Mother    Hyperlipidemia Mother    Obesity Sister    Obesity Sister    Obesity Brother    Ovarian cancer Maternal Grandmother 26       Cervical/Sqammacell   Breast cancer Maternal Aunt    Neuropathy Maternal Uncle    Breast cancer Maternal Great-grandmother  Skin cancer Maternal Great-grandmother     Past Medical History:  Diagnosis Date   Abnormal glucose tolerance test in pregnancy, antepartum 06/04/2012   Formatting of this note might be different from the original. Needs 3h GTT   Absolute anemia 10/22/2012   Anemia    B12 deficiency    Black tarry stools 12/20/2020   BRCA negative 10/2021   MyRisk neg   Family history of adverse reaction to anesthesia    MATERNAL AUNT-PT UNAWARE OF WHAT HAPPENED WITH ANESTHESIA BUT STATES HER AUNT HAS HAD PROBLEMS IN PAST    Family history of breast cancer 10/2021   IBIS=12.9%/riskscore=17.1%   Family history of ovarian cancer    Fecal occult blood test positive    Generalized abdominal pain 12/20/2020   Genital herpes    GERD (gastroesophageal reflux disease)    H/O cesarean section complicating pregnancy 04/19/2012   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/19/2021   Hair thinning 05/12/2018   Headache    MIGRAINE   Heartburn 10/26/2018   History of Roux-en-Y gastric bypass    Hyperlipidemia    Hypertension    Migraine    Obesity    PONV (postoperative nausea and vomiting)    Syncope 04/2015   Vitamin D deficiency    Yeast vaginitis 02/23/2019   Candida albicans on culture    Past Surgical History:  Procedure Laterality Date   APPENDECTOMY     BUNIONECTOMY     CESAREAN SECTION     X2   COLONOSCOPY WITH PROPOFOL N/A 12/31/2020   Procedure: COLONOSCOPY WITH PROPOFOL. eleview and spot ink tattoo used.;  Surgeon: Pasty Spillers, MD;  Location: [facility];  Service: Endoscopy;  Laterality: N/A;   COLONOSCOPY WITH PROPOFOL N/A  09/18/2022   Procedure: COLONOSCOPY WITH PROPOFOL;  Surgeon: Toney Reil, MD;  Location: [facility];  Service: Endoscopy;  Laterality: N/A;   DIAGNOSTIC LAPAROSCOPY     ENDOMETRIAL BIOPSY     ENTEROSCOPY N/A 01/23/2021   Procedure: ENTEROSCOPY;  Surgeon: Pasty Spillers, MD;  Location: [facility];  Service: Endoscopy;  Laterality: N/A;   ENTEROSCOPY N/A 02/04/2021   Procedure: ENTEROSCOPY;  Surgeon: Meryl Dare, MD;  Location: [facility];  Service: Gastroenterology;  Laterality: N/A;   ESOPHAGOGASTRODUODENOSCOPY (EGD) WITH PROPOFOL N/A 12/31/2020   Procedure: ESOPHAGOGASTRODUODENOSCOPY (EGD) WITH PROPOFOL;  Surgeon: Pasty Spillers, MD;  Location: [facility];  Service: Endoscopy;  Laterality: N/A;   FOREIGN BODY REMOVAL  02/04/2021   Procedure: FOREIGN BODY REMOVAL;  Surgeon: Meryl Dare, MD;  Location: [facility];  Service: Gastroenterology;;  staples from other gastric procedure   GASTRIC BYPASS     GIVENS CAPSULE STUDY N/A 02/19/2021   Procedure: GIVENS CAPSULE STUDY;  Surgeon: Pasty Spillers, MD;  Location: [facility];  Service: Endoscopy;  Laterality: N/A;   LASIK     MOUTH SURGERY     Tongue Cyst   NOVASURE ABLATION N/A 06/29/2015   Procedure: DILATION AND CURETTAGE, HYSTEROSCOPY, NOVASURE ABLATION;  Surgeon: Vena Austria, MD;  Location: [facility];  Service: Gynecology;  Laterality: N/A;   POLYPECTOMY  09/18/2022   Procedure: POLYPECTOMY;  Surgeon: Toney Reil, MD;  Location: [facility];  Service: Endoscopy;;   TUBAL LIGATION     WISDOM TOOTH EXTRACTION       Current Outpatient Medications on File Prior to Visit  Medication Sig Dispense Refill   Botulinum Toxin Type A (BOTOX) 200 units SOLR Provider to inject 155 units into the muscles of  the head and neck every 3 months. Discard remainder. 1 each 3   cyclobenzaprine (FLEXERIL) 5 MG tablet TAKE 1 TABLET BY MOUTH ONCE DAILY AS NEEDED FOR MIGRAINES. 30 tablet  0   fluconazole (DIFLUCAN) 150 MG tablet Take 1 tab once weekly as preventive for 3 months 15 tablet 0   FLUoxetine (PROZAC) 20 MG tablet TAKE 1 TABLET (20 MG TOTAL) BY MOUTH DAILY. FOR ANXIETY AND DEPRESSION. (Patient taking differently: Take 20 mg by mouth 2 (two) times daily. for anxiety and depression.) 90 tablet 0   NURTEC 75 MG TBDP TAKE 1 TABLET (75 MG TOTAL) BY MOUTH DAILY AS NEEDED. FOR MIGRAINES. TAKE AS CLOSE TO ONSET OF MIGRAINE AS POSSIBLE. ONE DAILY MAXIMUM. 16 tablet 11   ondansetron (ZOFRAN) 4 MG tablet Take 1 tablet (4 mg total) by mouth every 8 (eight) hours as needed for up to 20 doses for nausea or vomiting. 20 tablet 0   SUMAtriptan (IMITREX) 6 MG/0.5ML SOLN injection INJECT 6 MG INTO THE SKIN AT MIGRAINE ONSET, MAY REPEAT WITH 6 MG 2 HOURS LATER IF MIGRAINE PERSISTS. MAX 2 DOSES PER 24 HOURS. 5 mL 5   SUMAtriptan Succinate Refill 6 MG/0.5ML SOCT INJECT 6 MG INTO THE SKIN AT MIGRAINE ONSET, MAY REPEAT WITH 6 MG 2 HOURS LATER IF MIGRAINE PERSISTS. 0.5 mL 2   Syringe/Needle, Disp, (BD SAFETYGLIDE SYRINGE/NEEDLE) 27G X 5/8" 1 ML MISC Attach needle to syringe and use to draw up and administer a single dose of Sumatriptan into the skin. Do not reuse. 10 each 5   Vitamin D, Ergocalciferol, (DRISDOL) 1.25 MG (50000 UNIT) CAPS capsule TAKE 1 CAPSULE (50,000 UNITS TOTAL) BY MOUTH EVERY 7 (SEVEN) DAYS. FOR VITAMIN D. 12 capsule 0   No current facility-administered medications on file prior to visit.    Allergies  Allergen Reactions   Augmentin [Amoxicillin-Pot Clavulanate]     diarrhea   Codeine Other (See Comments)    Other reaction(s): Other (See Comments)   Hydrocodone-Acetaminophen Nausea And Vomiting     DIAGNOSTIC DATA (LABS, IMAGING, TESTING) - I reviewed patient records, labs, notes, testing and imaging myself where available.  Lab Results  Component Value Date   WBC 8.9 12/12/2021   HGB 11.9 (L) 12/12/2021   HCT 36.2 12/12/2021   MCV 92.8 12/12/2021   PLT 337  12/12/2021      Component Value Date/Time   NA 138 12/12/2021 0609   NA 140 06/25/2020 1521   K 3.6 12/12/2021 0609   CL 102 12/12/2021 0609   CO2 28 12/12/2021 0609   GLUCOSE 90 12/12/2021 0609   BUN 14 12/12/2021 0609   BUN 8 06/25/2020 1521   CREATININE 0.89 12/12/2021 0609   CALCIUM 8.4 (L) 12/12/2021 0609   PROT 6.9 12/12/2021 0609   PROT 7.0 06/25/2020 1521   ALBUMIN 3.5 12/12/2021 0609   ALBUMIN 4.3 06/25/2020 1521   AST 18 12/12/2021 0609   ALT 14 12/12/2021 0609   ALKPHOS 78 12/12/2021 0609   BILITOT 0.5 12/12/2021 0609   BILITOT 0.2 06/25/2020 1521   GFRNONAA >60 12/12/2021 0609   GFRAA 118 06/25/2020 1521   Lab Results  Component Value Date   CHOL 202 (H) 02/04/2023   HDL 43.80 02/04/2023   LDLCALC 132 (H) 02/04/2023   TRIG 131.0 02/04/2023   CHOLHDL 5 02/04/2023   Lab Results  Component Value Date   HGBA1C 5.3 11/12/2021   Lab Results  Component Value Date   VITAMINB12 254 04/02/2022   Lab Results  Component Value Date   TSH 1.77 11/12/2021    PHYSICAL EXAM:  Today's Vitals   02/16/23 1256  BP: 126/79  Pulse: 85  Weight: 247 lb 12.8 oz (112.4 kg)  Height: 5\' 3"  (1.6 m)   Body mass index is 43.9 kg/m.   Wt Readings from Last 3 Encounters:  02/16/23 247 lb 12.8 oz (112.4 kg)  02/04/23 248 lb (112.5 kg)  09/18/22 243 lb (110.2 kg)     Ht Readings from Last 3 Encounters:  02/16/23 5\' 3"  (1.6 m)  02/04/23 5' 3.5" (1.613 m)  09/18/22 5' 3.5" (1.613 m)      General: The patient is awake, alert and appears not in acute distress. The patient is well groomed. Head: Normocephalic, atraumatic. Neck is supple.  Mallampati 3,  neck circumference:16 inches .  Nasal airflow  patent.  Retrognathia is not seen.  Dental status: biological.   Bruxism guard is used.  Cardiovascular:  Regular rate and cardiac rhythm by pulse, without distended neck veins. Respiratory: Lungs are clear to auscultation.  Skin:  Without evidence of ankle edema, or  rash. Trunk: The patient's posture is erect.   NEUROLOGIC EXAM: The patient is awake and alert, oriented to place and time.   Memory subjective described as intact.  Attention span & concentration ability appears normal.  Speech is fluent,  without dysarthria, dysphonia or aphasia.  Mood and affect are appropriate.   Cranial nerves: no loss of smell or taste reported  Pupils are equal and briskly reactive to light. Funduscopic exam deferred.  Extraocular movements in vertical and horizontal planes were intact and without nystagmus. No Diplopia. Visual fields by finger perimetry are intact. Hearing was intact to soft voice and finger rubbing.    Facial sensation intact to fine touch.  Facial motor strength is symmetric and tongue and uvula move midline.  Neck ROM : rotation, tilt and flexion extension were normal for age and shoulder shrug was symmetrical.    Motor exam:  Symmetric bulk, tone and ROM.   Normal tone without cog-wheeling, symmetric grip strength .   Sensory:  Fine touch and vibration were tested  and  normal.  Proprioception tested in the upper extremities was normal.   Coordination: Rapid alternating movements in the fingers/hands were of normal speed.  The Finger-to-nose maneuver was intact without evidence of ataxia, dysmetria or tremor.   Gait and station: Patient could rise unassisted from a seated position, walked without assistive device.  Stance is of normal width/ base .  Toe and heel walk were deferred.  Deep tendon reflexes: in the  upper and lower extremities are symmetric and intact.  Babinski response was deferred.   ASSESSMENT AND PLAN 37 y.o. year old female  here with:    1) Migraine chronic , without aura-  and with morning headaches daily ( 5/ 7 days) , cluster night time sleep arousals, 5 /months. These symptoms deserve to be evaluated for hypoxia.   2) excessive daytime sleepiness with long sleep time and sleep inertia. No vivid dreams, no  sleep paralysis.   3) Sleep is fragmented for various reasons. Husband told her she doesn't snore or only mildly .  Work up for Hypersomnia , with prolonged sleep time,  Delayed sleep phase, circadian rhythm disorder  Fatigue was exacerbated after gastric bleed in 2022, history of Roux and Y surgery 10 years ago. Blood loss anemia was extremely fatiguing.  Had some RLS after that , but now infrequent.    I  would like to order a two prong approach,  the patient will need screening for sleep apnea and for anemia, ferritin, TIBC , continue Vit D and B12 supplements ( bariatric Vitamins) .    I plan to follow up either personally or through our NP within 3-4 months.   I would like to thank  Anson Fret, Md [address] [place],  [place] [address] for allowing me to meet with and to take care of this pleasant patient.   CC: I will share my notes with Dr Lucia Gaskins .  After spending a total time of  45  minutes face to face and additional time for physical and neurologic examination, review of laboratory studies,  personal review of imaging studies, reports and results of other testing and review of referral information / records as far as provided in visit,   Electronically signed by: Melvyn Novas, MD 02/16/2023 1:06 PM  [facility] Board certified by The [employer] of Sleep Medicine and Diplomate of the [employer] of Sleep Medicine. Board certified In Neurology through the ABPN, Fellow of the [employer] of Neurology. Medical Director of [facility].

## 2023-02-17 ENCOUNTER — Encounter: Payer: Self-pay | Admitting: Oncology

## 2023-03-23 ENCOUNTER — Telehealth: Payer: Self-pay | Admitting: Neurology

## 2023-03-23 NOTE — Telephone Encounter (Signed)
MAILOUT- [employer] no auth req ref # Jean [id number]   Patient is on the schedule for 03/25/23.

## 2023-03-25 ENCOUNTER — Ambulatory Visit: Payer: BLUE CROSS/BLUE SHIELD | Admitting: Neurology

## 2023-03-25 DIAGNOSIS — R519 Headache, unspecified: Secondary | ICD-10-CM

## 2023-03-25 DIAGNOSIS — Z8719 Personal history of other diseases of the digestive system: Secondary | ICD-10-CM

## 2023-03-25 DIAGNOSIS — G4719 Other hypersomnia: Secondary | ICD-10-CM

## 2023-03-25 DIAGNOSIS — E662 Morbid (severe) obesity with alveolar hypoventilation: Secondary | ICD-10-CM

## 2023-03-25 DIAGNOSIS — Z9884 Bariatric surgery status: Secondary | ICD-10-CM

## 2023-03-25 DIAGNOSIS — G4710 Hypersomnia, unspecified: Secondary | ICD-10-CM

## 2023-03-25 DIAGNOSIS — G43709 Chronic migraine without aura, not intractable, without status migrainosus: Secondary | ICD-10-CM

## 2023-04-01 NOTE — Progress Notes (Signed)
[facility]  Jean Pitts 37 year old female Oct 28, 1985   HOME SLEEP TEST REPORT ( MAIL -OUT TEST by Watch PAT)   STUDY DATE:  04-01-2023 ORDERING CLINICIAN: Melvyn Novas, MD  REFERRING CLINICIAN: Dr Lucia Gaskins, MD    CLINICAL INFORMATION/HISTORY: Chronic migraines and Excessive daytime sleepiness.  02/16/2023 from Dr Lucia Gaskins for a Sleep consultation.  Chief concern according to patient : I have the pleasure of seeing Jean Pitts 02/16/23  who present with excessive daytime sleepiness, with morning headaches and with nocturnal headaches that wake her out of sleep.  Morning headaches are dull and throbbing, arising from the forehead. Sometimes these evolve into migraines over the day. Those HA that wake her are more piercing and sharp, stabbing above the bridge of the nose.  The sleepiness has evolved after her pregnancies and worsened over the last 2-3 years.  " I always wanted to nap, but now its imperative - I can't resist it- and I work from home".  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 She also struggles with weight.  She suffered a GI bleed  12-2020 after gastric bypass in 2014 at [facility], had severe anemia, iron deficiency. 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     Epworth sleepiness score: 18/24. FSS at  57/63 points.    BMI: 44.1  kg/m   Neck Circumference: 16"   FINDINGS:   Sleep Summary:   Total Recording Time (hours, min):   7 hours 10 minutes  Total Sleep Time (hours, min):   5 hours 25 minutes              Percent REM (%):   20%     This patient was awake for 95 minutes after initial sleep onset, REM sleep latency was 161 minutes and total sleep latency 10 minutes.                                  Respiratory Indices:   Calculated pAHI (per hour):   4.7/h                          REM pAHI: 9.3/h                                               NREM pAHI: 3.5/h                             Positional AHI: The patient slept for the majority of the night nonsupine mostly on her left side with an AHI of 1.5/h.  Supine sleep was associated with an AHI of 4.2/h, prone sleep with an AHI of 9.2/h.  Snoring reached a mean volume of 41 dB and was present for 10% of total sleep time.                                                  Oxygen Saturation Statistics:  O2 Saturation Range (%):    Between a nadir 90% with a maximum of 99% with a mean saturation of 96%                                   O2 Saturation (minutes) <89%: 0 minutes         Pulse Rate Statistics:   Pulse Mean (bpm):    70 bpm             Pulse Range:    Between 49 and 125 bpm             IMPRESSION:  This HST did not confirm the presence of sleep apnea.  The patient's AHI also known as apnea-hypopnea index is clearly under 5 an hour and therefore would not be treated by CPAP under insurance coverage.   RECOMMENDATION: weight loss is may main recommendation, sleep hygiene improvements , setting bed time and rise time , and following up with Dr Lucia Gaskins for headaches.  If this patient endorses narcoleptic symptoms, please send for HLA labs.      INTERPRETING PHYSICIAN:  Melvyn Novas, MD

## 2023-04-03 ENCOUNTER — Encounter: Payer: Self-pay | Admitting: Neurology

## 2023-04-05 ENCOUNTER — Encounter: Payer: Self-pay | Admitting: Oncology

## 2023-04-05 ENCOUNTER — Encounter: Payer: Self-pay | Admitting: Neurology

## 2023-04-05 DIAGNOSIS — G4719 Other hypersomnia: Secondary | ICD-10-CM | POA: Insufficient documentation

## 2023-04-05 NOTE — Procedures (Signed)
[facility]  Jean Pitts 37 year old female 03-18-1986   HOME SLEEP TEST REPORT ( MAIL -OUT TEST by Watch PAT)   STUDY DATE:  04-01-2023 ORDERING CLINICIAN: Melvyn Novas, MD  REFERRING CLINICIAN: Dr Lucia Gaskins, MD    CLINICAL INFORMATION/HISTORY: Chronic migraines and Excessive daytime sleepiness.  02/16/2023 from Dr Lucia Gaskins for a Sleep consultation.  Chief concern according to patient : I have the pleasure of seeing Jean Pitts 02/16/23  who present with excessive daytime sleepiness, with morning headaches and with nocturnal headaches that wake her out of sleep.  Morning headaches are dull and throbbing, arising from the forehead. Sometimes these evolve into migraines over the day. Those HA that wake her are more piercing and sharp, stabbing above the bridge of the nose.  The sleepiness has evolved after her pregnancies and worsened over the last 2-3 years.  " I always wanted to nap, but now its imperative - I can't resist it- and I work from home".  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 She also struggles with weight.  She suffered a GI bleed  12-2020 after gastric bypass in 2014 at [facility], had severe anemia, iron deficiency. 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     Epworth sleepiness score: 18/24. FSS at  57/63 points.    BMI: 44.1  kg/m   Neck Circumference: 16"   FINDINGS:   Sleep Summary:   Total Recording Time (hours, min):   7 hours 10 minutes  Total Sleep Time (hours, min):   5 hours 25 minutes              Percent REM (%):   20%     This patient was awake for 95 minutes after initial sleep onset, REM sleep latency was 161 minutes and total sleep latency 10 minutes.                                  Respiratory Indices:   Calculated pAHI (per hour):   4.7/h                          REM pAHI: 9.3/h                                               NREM pAHI: 3.5/h                            Positional AHI: The  patient slept for the majority of the night nonsupine mostly on her left side with an AHI of 1.5/h.  Supine sleep was associated with an AHI of 4.2/h, prone sleep with an AHI of 9.2/h.  Snoring reached a mean volume of 41 dB and was present for 10% of total sleep time.                                                  Oxygen Saturation Statistics:           O2 Saturation Range (%):  Between a nadir 90% with a maximum of 99% with a mean saturation of 96%                                   O2 Saturation (minutes) <89%: 0 minutes         Pulse Rate Statistics:   Pulse Mean (bpm):    70 bpm             Pulse Range:    Between 49 and 125 bpm             IMPRESSION:  This HST did not confirm the presence of sleep apnea.  The patient's AHI also known as apnea-hypopnea index is clearly under 5 an hour and therefore would not be treated by CPAP under insurance coverage.   RECOMMENDATION: weight loss is may main recommendation, sleep hygiene improvements , setting bed time and rise time , and following up with Dr Lucia Gaskins for headaches     INTERPRETING PHYSICIAN:  Melvyn Novas, MD

## 2023-04-06 ENCOUNTER — Telehealth: Payer: Self-pay

## 2023-04-06 NOTE — Telephone Encounter (Signed)
I left the patient a voicemail to return our call. After the call, I noticed the patient had sent a mychart message requesting information about her sleep results. I have sent the results to the patient via mychart.

## 2023-04-06 NOTE — Telephone Encounter (Signed)
-----   Message from Melvyn Novas, MD sent at 04/05/2023  5:54 PM EDT ----- This HST did not confirm the presence of sleep apnea.  The patient's AHI also known as apnea-hypopnea index is clearly under 5 an hour and therefore would not be treated by CPAP under insurance coverage.   RECOMMENDATION: weight loss is may main recommendation, sleep hygiene improvements , setting bed time and rise time , and following up with Dr Lucia Gaskins for headaches.  If this patient endorses narcoleptic symptoms, please send for HLA labs.

## 2023-04-15 ENCOUNTER — Encounter: Payer: Self-pay | Admitting: Oncology

## 2023-04-21 ENCOUNTER — Ambulatory Visit: Payer: BLUE CROSS/BLUE SHIELD | Admitting: Neurology

## 2023-04-22 ENCOUNTER — Ambulatory Visit (INDEPENDENT_AMBULATORY_CARE_PROVIDER_SITE_OTHER): Payer: BLUE CROSS/BLUE SHIELD | Admitting: Neurology

## 2023-04-22 DIAGNOSIS — G43009 Migraine without aura, not intractable, without status migrainosus: Secondary | ICD-10-CM | POA: Insufficient documentation

## 2023-04-22 DIAGNOSIS — G43709 Chronic migraine without aura, not intractable, without status migrainosus: Secondary | ICD-10-CM | POA: Diagnosis not present

## 2023-04-22 MED ORDER — ONABOTULINUMTOXINA 200 UNITS IJ SOLR
155.0000 [IU] | Freq: Once | INTRAMUSCULAR | Status: AC
  Administered 2023-04-22: 155 [IU] via INTRAMUSCULAR

## 2023-04-22 MED ORDER — QULIPTA 60 MG PO TABS: 60.0000 mg | ORAL_TABLET | Freq: Every day | ORAL | 11 refills | Status: DC

## 2023-04-22 NOTE — Progress Notes (Signed)
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

## 2023-04-22 NOTE — Patient Instructions (Signed)
Botox preventative Layer on qulipta daily Nurtec as needed  Atogepant Tablets What is this medication? ATOGEPANT (a TOE je pant) prevents migraines. It works by blocking a substance in the body that causes migraines. This medicine may be used for other purposes; ask your health care provider or pharmacist if you have questions. COMMON BRAND NAME(S): QULIPTA What should I tell my care team before I take this medication? They need to know if you have any of these conditions: Kidney disease Liver disease An unusual or allergic reaction to atogepant, other medications, foods, dyes, or preservatives Pregnant or trying to get pregnant Breast-feeding How should I use this medication? Take this medication by mouth with water. Take it as directed on the prescription label at the same time every day. You can take it with or without food. If it upsets your stomach, take it with food. Keep taking it unless your care team tells you to stop. Talk to your care team about the use of this medication in children. Special care may be needed. Overdosage: If you think you have taken too much of this medicine contact a poison control center or emergency room at once. NOTE: This medicine is only for you. Do not share this medicine with others. What if I miss a dose? If you miss a dose, take it as soon as you can. If it is almost time for your next dose, take only that dose. Do not take double or extra doses. What may interact with this medication? Carbamazepine Certain medications for fungal infections, such as itraconazole, ketoconazole Clarithromycin Cyclosporine Efavirenz Etravirine Phenytoin Rifampin St. John's wort This list may not describe all possible interactions. Give your health care provider a list of all the medicines, herbs, non-prescription drugs, or dietary supplements you use. Also tell them if you smoke, drink alcohol, or use illegal drugs. Some items may interact with your  medicine. What should I watch for while using this medication? Visit your care team for regular checks on your progress. Tell your care team if your symptoms do not start to get better or if they get worse. What side effects may I notice from receiving this medication? Side effects that you should report to your care team as soon as possible: Allergic reactions--skin rash, itching, hives, swelling of the face, lips, tongue, or throat Side effects that usually do not require medical attention (report to your care team if they continue or are bothersome): Constipation Fatigue Loss of appetite with weight loss Nausea This list may not describe all possible side effects. Call your doctor for medical advice about side effects. You may report side effects to FDA at 1-800-FDA-1088. Where should I keep my medication? Keep out of the reach of children and pets. Store at room temperature between 20 and 25 degrees C (68 and 77 degrees F). Get rid of any unused medication after the expiration date. To get rid of medications that are no longer needed or have expired: Take the medication to a medication take-back program. Check with your pharmacy or law enforcement to find a location. If you cannot return the medication, check the label or package insert to see if the medication should be thrown out in the garbage or flushed down the toilet. If you are not sure, ask your care team. If it is safe to put it in the trash, take the medication out of the container. Mix the medication with cat litter, dirt, coffee grounds, or other unwanted substance. Seal the mixture in a bag  or container. Put it in the trash. NOTE: This sheet is a summary. It may not cover all possible information. If you have questions about this medicine, talk to your doctor, pharmacist, or health care provider.  2024 [employer]vier/Gold Standard (2021-11-04 00:00:00)

## 2023-04-22 NOTE — Progress Notes (Signed)
Consent Form Botulism Toxin Injection For Chronic Migraine  04/22/2023: botox is working great, 5 migraine days a month and < 8 total headache days a month  01/27/2023: botox working great, 5 total migraines a month and < 10 total headaches a month. Tried imitrex. Tried rizatriptan.prescribe Nurtec. At baseline, she was having daily headaches and Mod to severe migraine at least 1/2 the month 15 days migraines, so botox has tremendously helped. prn Still have a burden, will try to layer qulipta on top, uses nurtec as needed. +a  Botox preventative Layer on qulipta daily Nurtec as needed  Meds ordered this encounter  Medications   botulinum toxin Type A (BOTOX) injection 155 Units   Atogepant (QULIPTA) 60 MG TABS    Sig: Take 1 tablet (60 mg total) by mouth daily. Please run copay card: BIN [id number] [facility] GRP [id number] ID [id number]    Dispense:  30 tablet    Refill:  11    Please run copay card: BIN [id number] [facility] GRP [id number] ID [id number]     ESS = 20  11/23/2022: > 50% improvement in migraine and headache freq and severity. +a 08/25/2022: First botox   No orders of the defined types were placed in this encounter.   Meds ordered this encounter  Medications   botulinum toxin Type A (BOTOX) injection 155 Units   Atogepant (QULIPTA) 60 MG TABS    Sig: Take 1 tablet (60 mg total) by mouth daily. Please run copay card: BIN [id number] PCN [facility] GRP [id number] ID [id number]    Dispense:  30 tablet    Refill:  11    Please run copay card: BIN [id number] PCN [facility] GRP [id number] ID [id number]     11/23/2022: > 50% improvement in migraine and headache freq and severity. +a 08/25/2022: First botox  Reviewed orally with patient, additionally signature is on file:  Botulism toxin has been approved by the Federal drug administration for treatment of chronic migraine. Botulism toxin does not cure chronic migraine and it may not be effective in some patients.  The administration of  botulism toxin is accomplished by injecting a small amount of toxin into the muscles of the neck and head. Dosage must be titrated for each individual. Any benefits resulting from botulism toxin tend to wear off after 3 months with a repeat injection required if benefit is to be maintained. Injections are usually done every 3-4 months with maximum effect peak achieved by about 2 or 3 weeks. Botulism toxin is expensive and you should be sure of what costs you will incur resulting from the injection.  The side effects of botulism toxin use for chronic migraine may include:   -Transient, and usually mild, facial weakness with facial injections  -Transient, and usually mild, head or neck weakness with head/neck injections  -Reduction or loss of forehead facial animation due to forehead muscle weakness  -Eyelid drooping  -Dry eye  -Pain at the site of injection or bruising at the site of injection  -Double vision  -Potential unknown long term risks  Contraindications: You should not have Botox if you are pregnant, nursing, allergic to albumin, have an infection, skin condition, or muscle weakness at the site of the injection, or have myasthenia gravis, Lambert-Eaton syndrome, or ALS.  It is also possible that as with any injection, there may be an allergic reaction or no effect from the medication. Reduced effectiveness after repeated injections is sometimes seen and rarely infection at the injection site may occur. All  care will be taken to prevent these side effects. If therapy is given over a long time, atrophy and wasting in the muscle injected may occur. Occasionally the patient's become refractory to treatment because they develop antibodies to the toxin. In this event, therapy needs to be modified.  I have read the above information and consent to the administration of botulism toxin.    BOTOX PROCEDURE NOTE FOR MIGRAINE HEADACHE    Contraindications and precautions discussed with  patient(above). Aseptic procedure was observed and patient tolerated procedure. Procedure performed by Dr. Artemio Aly  The condition has existed for more than 6 months, and pt does not have a diagnosis of ALS, Myasthenia Gravis or Lambert-Eaton Syndrome.  Risks and benefits of injections discussed and pt agrees to proceed with the procedure.  Written consent obtained  These injections are medically necessary. Pt  receives good benefits from these injections. These injections do not cause sedations or hallucinations which the oral therapies may cause.  Description of procedure:  The patient was placed in a sitting position. The standard protocol was used for Botox as follows, with 5 units of Botox injected at each site:   -Procerus muscle, midline injection  -Corrugator muscle, bilateral injection  -Frontalis muscle, bilateral injection, with 2 sites each side, medial injection was performed in the upper one third of the frontalis muscle, in the region vertical from the medial inferior edge of the superior orbital rim. The lateral injection was again in the upper one third of the forehead vertically above the lateral limbus of the cornea, 1.5 cm lateral to the medial injection site.  -Temporalis muscle injection, 4 sites, bilaterally. The first injection was 3 cm above the tragus of the ear, second injection site was 1.5 cm to 3 cm up from the first injection site in line with the tragus of the ear. The third injection site was 1.5-3 cm forward between the first 2 injection sites. The fourth injection site was 1.5 cm posterior to the second injection site.   -Occipitalis muscle injection, 3 sites, bilaterally. The first injection was done one half way between the occipital protuberance and the tip of the mastoid process behind the ear. The second injection site was done lateral and superior to the first, 1 fingerbreadth from the first injection. The third injection site was 1 fingerbreadth  superiorly and medially from the first injection site.  -Cervical paraspinal muscle injection, 2 sites, bilateral knee first injection site was 1 cm from the midline of the cervical spine, 3 cm inferior to the lower border of the occipital protuberance. The second injection site was 1.5 cm superiorly and laterally to the first injection site.  -Trapezius muscle injection was performed at 3 sites, bilaterally. The first injection site was in the upper trapezius muscle halfway between the inflection point of the neck, and the acromion. The second injection site was one half way between the acromion and the first injection site. The third injection was done between the first injection site and the inflection point of the neck.   Will return for repeat injection in 3 months.   200 units of Botox was used, 45 U Botox not injected was wasted. The patient tolerated the procedure well, there were no complications of the above procedure.

## 2023-04-24 DIAGNOSIS — F331 Major depressive disorder, recurrent, moderate: Secondary | ICD-10-CM

## 2023-04-24 MED ORDER — FLUOXETINE HCL 20 MG PO TABS: 60.0000 mg | ORAL_TABLET | Freq: Every day | ORAL | 2 refills | Status: DC

## 2023-04-28 ENCOUNTER — Encounter: Payer: Self-pay | Admitting: Neurology

## 2023-05-18 ENCOUNTER — Encounter: Payer: Self-pay | Admitting: Oncology

## 2023-05-27 ENCOUNTER — Ambulatory Visit (INDEPENDENT_AMBULATORY_CARE_PROVIDER_SITE_OTHER): Payer: BLUE CROSS/BLUE SHIELD | Admitting: Psychology

## 2023-05-27 DIAGNOSIS — F89 Unspecified disorder of psychological development: Secondary | ICD-10-CM
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
## 2023-05-28 NOTE — Progress Notes (Signed)
PCP:  Doreene Nest, NP   Chief Complaint  Patient presents with   Gynecologic Exam    No concerns     HPI:      [name] is a 37 y.o. [id number] whose LMP was Patient's last menstrual period was 05/04/2023 (approximate)., presents today for her annual examination.  Her menses are monthly, lasting 4 days with wiping only, occas BTB, mod dysmen, improved with tylenol/heating pad. S/p endometrial ablation. Hx of PCOS.   Sex activity: single partner, contraception - tubal ligation. Has vaginal dryness, using coconut oil as moisturizer, using lubricants. Was wearing thongs regularly and had hypertrophy of perineal skin but is doing better not wearing thongs.   Last Pap: 05/07/18  Results were: no abnormalities /neg HPV DNA Hx of STDs: HSV  Hx of recurrent yeast vag, needing wkly diflucan in past as preventive. Pt taking probiotics, wearing thongs occas. Uses unscented soap, no dryer sheets.  Takes diflucan prn now. Sx not as frequent. No recent DM screen.   There is a FH of breast cancer in her MGGM and mat aunt. Her cousin (aunt's daughter) had neg BRCA testing. There is a FH of ovarian cancer in her MGM in her 23s, then died with cancer mets in early 75s. Pt's mom almost certain it was ovarian and not cx.  The patient does not do self-breast exams. Pt is neg MyRisk 2/23, IBIS=12.9%/riskscore=17.1%.  Tobacco use: The patient denies current or previous tobacco use. Alcohol use: social drinker No drug use.  Exercise: min active  Had colonoscopy 2022 for internal bleeding. 12/23 with polyp at [facility]; repeat due after 5 yrs. Found to have precancerous colon polyp with [facility], due for repeat in 1 yr.   She does get adequate calcium and Vitamin D in her diet. S/p bariatric surg; labs with PCP.  Past Medical History:  Diagnosis Date   Abnormal glucose tolerance test in pregnancy, antepartum 06/04/2012   Formatting of this note might be different from the original.  Needs 3h GTT   Absolute anemia 10/22/2012   Anemia    B12 deficiency    Black tarry stools 12/20/2020   BRCA negative 10/2021   MyRisk neg   Family history of adverse reaction to anesthesia    MATERNAL AUNT-PT UNAWARE OF WHAT HAPPENED WITH ANESTHESIA BUT STATES HER AUNT HAS HAD PROBLEMS IN PAST    Family history of breast cancer 10/2021   IBIS=12.9%/riskscore=17.1%   Family history of ovarian cancer    Fecal occult blood test positive    Generalized abdominal pain 12/20/2020   Genital herpes    GERD (gastroesophageal reflux disease)    H/O cesarean section complicating pregnancy 04/19/2012   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/19/2021   Hair thinning 05/12/2018   Headache    MIGRAINE   Heartburn 10/26/2018   History of Roux-en-Y gastric bypass    Hyperlipidemia    Hypertension    Migraine    Obesity    PONV (postoperative nausea and vomiting)    Syncope 04/2015   Vitamin D deficiency    Yeast vaginitis 02/23/2019   Candida albicans on culture    Past Surgical History:  Procedure Laterality Date   APPENDECTOMY     BUNIONECTOMY     CESAREAN SECTION     X2   COLONOSCOPY WITH PROPOFOL N/A 12/31/2020   Procedure: COLONOSCOPY WITH PROPOFOL. eleview and spot ink tattoo used.;  Surgeon: Pasty Spillers, MD;  Location: [facility];  Service: Endoscopy;  Laterality: N/A;   COLONOSCOPY WITH PROPOFOL N/A 09/18/2022   Procedure: COLONOSCOPY WITH PROPOFOL;  Surgeon: Toney Reil, MD;  Location: [facility];  Service: Endoscopy;  Laterality: N/A;   DIAGNOSTIC LAPAROSCOPY     ENDOMETRIAL BIOPSY     ENTEROSCOPY N/A 01/23/2021   Procedure: ENTEROSCOPY;  Surgeon: Pasty Spillers, MD;  Location: [facility];  Service: Endoscopy;  Laterality: N/A;   ENTEROSCOPY N/A 02/04/2021   Procedure: ENTEROSCOPY;  Surgeon: Meryl Dare, MD;  Location: [facility];  Service: Gastroenterology;  Laterality: N/A;   ESOPHAGOGASTRODUODENOSCOPY (EGD) WITH PROPOFOL N/A 12/31/2020    Procedure: ESOPHAGOGASTRODUODENOSCOPY (EGD) WITH PROPOFOL;  Surgeon: Pasty Spillers, MD;  Location: [facility];  Service: Endoscopy;  Laterality: N/A;   FOREIGN BODY REMOVAL  02/04/2021   Procedure: FOREIGN BODY REMOVAL;  Surgeon: Meryl Dare, MD;  Location: [facility];  Service: Gastroenterology;;  staples from other gastric procedure   GASTRIC BYPASS     GIVENS CAPSULE STUDY N/A 02/19/2021   Procedure: GIVENS CAPSULE STUDY;  Surgeon: Pasty Spillers, MD;  Location: [facility];  Service: Endoscopy;  Laterality: N/A;   LASIK     MOUTH SURGERY     Tongue Cyst   NOVASURE ABLATION N/A 06/29/2015   Procedure: DILATION AND CURETTAGE, HYSTEROSCOPY, NOVASURE ABLATION;  Surgeon: Vena Austria, MD;  Location: [facility];  Service: Gynecology;  Laterality: N/A;   POLYPECTOMY  09/18/2022   Procedure: POLYPECTOMY;  Surgeon: Toney Reil, MD;  Location: [facility];  Service: Endoscopy;;   TUBAL LIGATION     WISDOM TOOTH EXTRACTION      Family History  Problem Relation Age of Onset   Hemangiomas Mother        benign osseous 2022 MRI   Migraines Mother    Obesity Mother    Hyperlipidemia Mother    Obesity Sister    Obesity Sister    Obesity Brother    Ovarian cancer Maternal Grandmother 26       Cervical/Sqammacell   Breast cancer Maternal Aunt    Neuropathy Maternal Uncle    Breast cancer Maternal Great-grandmother    Skin cancer Maternal Great-grandmother     Social History   Socioeconomic History   Marital status: Married    Spouse name: Not on file   Number of children: 2   Years of education: Not on file   Highest education level: Some college, no degree  Occupational History   Occupation: Immunologist   Tobacco Use   Smoking status: Never   Smokeless tobacco: Never  Vaping Use   Vaping status: Never Used  Substance and Sexual Activity   Alcohol use: Not Currently    Comment: RARE   Drug use: No   Sexual activity: Yes     Partners: Male    Birth control/protection: Surgical  Other Topics Concern   Not on file  Social History Narrative      She also goes to school full time at [employer]   Social Determinants of Health   Financial Resource Strain: Unknown (05/12/2018)   Overall Financial Resource Strain (CARDIA)    Difficulty of Paying Living Expenses: Patient declined  Food Insecurity: No Food Insecurity (05/12/2018)   Hunger Vital Sign    Worried About Running Out of Food in the Last Year: Never true    Ran Out of Food in the Last Year: Never true  Transportation Needs: No Transportation Needs (05/12/2018)   PRAPARE - Transportation    Lack of  Transportation (Medical): No    Lack of Transportation (Non-Medical): No  Physical Activity: Inactive (05/12/2018)   Exercise Vital Sign    Days of Exercise per Week: 0 days    Minutes of Exercise per Session: 0 min  Stress: No Stress Concern Present (05/12/2018)   [employer] of Occupational Health - Occupational Stress Questionnaire    Feeling of Stress : Not at all  Social Connections: Unknown (05/12/2018)   Social Connection and Isolation Panel [NHANES]    Frequency of Communication with Friends and Family: More than three times a week    Frequency of Social Gatherings with Friends and Family: More than three times a week    Attends Religious Services: More than 4 times per year    Active Member of [employer] or Organizations: No    Attends Banker Meetings: Never    Marital Status: Not on file  Intimate Partner Violence: Not At Risk (05/12/2018)   Humiliation, Afraid, Rape, and Kick questionnaire    Fear of Current or Ex-Partner: No    Emotionally Abused: No    Physically Abused: No    Sexually Abused: No     Current Outpatient Medications:    Atogepant (QULIPTA) 60 MG TABS, Take 1 tablet (60 mg total) by mouth daily. Please run copay card: BIN [id number] PCN OHCP GRP [id number] ID [id number], Disp: 30 tablet, Rfl: 11   Botulinum Toxin Type  A (BOTOX) 200 units SOLR, Provider to inject 155 units into the muscles of the head and neck every 3 months. Discard remainder., Disp: 1 each, Rfl: 3   cyclobenzaprine (FLEXERIL) 5 MG tablet, TAKE 1 TABLET BY MOUTH ONCE DAILY AS NEEDED FOR MIGRAINES., Disp: 30 tablet, Rfl: 0   FLUoxetine (PROZAC) 20 MG tablet, Take 3 tablets (60 mg total) by mouth daily. for anxiety and depression., Disp: 270 tablet, Rfl: 2   NURTEC 75 MG TBDP, TAKE 1 TABLET (75 MG TOTAL) BY MOUTH DAILY AS NEEDED. FOR MIGRAINES. TAKE AS CLOSE TO ONSET OF MIGRAINE AS POSSIBLE. ONE DAILY MAXIMUM., Disp: 16 tablet, Rfl: 11   ondansetron (ZOFRAN) 4 MG tablet, Take 1 tablet (4 mg total) by mouth every 8 (eight) hours as needed for up to 20 doses for nausea or vomiting., Disp: 20 tablet, Rfl: 0   SUMAtriptan (IMITREX) 6 MG/0.5ML SOLN injection, INJECT 6 MG INTO THE SKIN AT MIGRAINE ONSET, MAY REPEAT WITH 6 MG 2 HOURS LATER IF MIGRAINE PERSISTS. MAX 2 DOSES PER 24 HOURS., Disp: 5 mL, Rfl: 5   Syringe/Needle, Disp, (BD SAFETYGLIDE SYRINGE/NEEDLE) 27G X 5/8" 1 ML MISC, Attach needle to syringe and use to draw up and administer a single dose of Sumatriptan into the skin. Do not reuse., Disp: 10 each, Rfl: 5   Vitamin D, Ergocalciferol, (DRISDOL) 1.25 MG (50000 UNIT) CAPS capsule, TAKE 1 CAPSULE (50,000 UNITS TOTAL) BY MOUTH EVERY 7 (SEVEN) DAYS. FOR VITAMIN D., Disp: 12 capsule, Rfl: 0   fluconazole (DIFLUCAN) 150 MG tablet, Take 1 tab for 1 dose prn sx or weekly as preventive, Disp: 10 tablet, Rfl: 1     ROS:  Review of Systems  Constitutional:  Negative for fatigue, fever and unexpected weight change.  Respiratory:  Negative for cough, shortness of breath and wheezing.   Cardiovascular:  Negative for chest pain, palpitations and leg swelling.  Gastrointestinal:  Negative for blood in stool, constipation, diarrhea, nausea and vomiting.  Endocrine: Negative for cold intolerance, heat intolerance and polyuria.  Genitourinary:  Negative for  dyspareunia, dysuria,  flank pain, frequency, genital sores, hematuria, menstrual problem, pelvic pain, urgency, vaginal bleeding, vaginal discharge and vaginal pain.  Musculoskeletal:  Negative for back pain, joint swelling and myalgias.  Skin:  Negative for rash.  Neurological:  Negative for dizziness, syncope, light-headedness, numbness and headaches.  Hematological:  Negative for adenopathy.  Psychiatric/Behavioral:  Negative for agitation, confusion, sleep disturbance and suicidal ideas. The patient is not nervous/anxious.   BREAST: No symptoms   Objective: BP 110/71   Pulse 74   Ht 5' 3.5" (1.613 m)   Wt 251 lb (113.9 kg)   LMP 05/04/2023 (Approximate)   BMI 43.77 kg/m    Physical Exam Constitutional:      Appearance: She is well-developed.  Genitourinary:     Vulva normal.     Right Labia: No rash, tenderness or lesions.    Left Labia: No tenderness, lesions or rash.    No vaginal discharge, erythema or tenderness.      Right Adnexa: not tender and no mass present.    Left Adnexa: not tender and no mass present.    No cervical friability or polyp.     Uterus is not enlarged or tender.  Breasts:    Right: No mass, nipple discharge, skin change or tenderness.     Left: No mass, nipple discharge, skin change or tenderness.  Neck:     Thyroid: No thyromegaly.  Cardiovascular:     Rate and Rhythm: Normal rate and regular rhythm.     Heart sounds: Normal heart sounds. No murmur heard. Pulmonary:     Effort: Pulmonary effort is normal.     Breath sounds: Normal breath sounds.  Abdominal:     Palpations: Abdomen is soft.     Tenderness: There is no abdominal tenderness. There is no guarding or rebound.  Musculoskeletal:        General: Normal range of motion.     Cervical back: Normal range of motion.  Lymphadenopathy:     Cervical: No cervical adenopathy.  Neurological:     General: No focal deficit present.     Mental Status: She is alert and oriented to person,  place, and time.     Cranial Nerves: No cranial nerve deficit.  Skin:    General: Skin is warm and dry.  Psychiatric:        Mood and Affect: Mood normal.        Behavior: Behavior normal.        Thought Content: Thought content normal.        Judgment: Judgment normal.  Vitals reviewed.     Assessment/Plan: Encounter for annual routine gynecological examination  Cervical cancer screening - Plan: Cytology - PAP  Screening for HPV (human papillomavirus) - Plan: Cytology - PAP  Family history of breast cancer--pt is MyRisk neg; no increased risk. Start mammos age 71  Yeast vaginitis - Plan: Hemoglobin A1c, fluconazole (DIFLUCAN) 150 MG tablet; check for DM, Rx RF diflucan prn sx. D/c thong use  PCOS (polycystic ovarian syndrome) - Plan: Hemoglobin A1c  Blood tests for routine general physical examination - Plan: Comprehensive metabolic panel    Meds ordered this encounter  Medications   fluconazole (DIFLUCAN) 150 MG tablet    Sig: Take 1 tab for 1 dose prn sx or weekly as preventive    Dispense:  10 tablet    Refill:  1    Order Specific Question:   Supervising Provider    Answer:   Waymon Budge  GYN counsel adequate intake of calcium and vitamin D, diet and exercise     F/U  Return in about 1 year (around 05/28/2024).  Aariel Ems B. Samuele Storey, PA-C 05/29/2023 4:55 PM

## 2023-05-29 ENCOUNTER — Ambulatory Visit (INDEPENDENT_AMBULATORY_CARE_PROVIDER_SITE_OTHER): Payer: BLUE CROSS/BLUE SHIELD | Admitting: Obstetrics and Gynecology

## 2023-05-29 ENCOUNTER — Encounter: Payer: Self-pay | Admitting: Obstetrics and Gynecology

## 2023-05-29 ENCOUNTER — Other Ambulatory Visit (HOSPITAL_COMMUNITY)
Admission: RE | Admit: 2023-05-29 | Discharge: 2023-05-29 | Disposition: A | Discharge status: Home / Self Care | Payer: BLUE CROSS/BLUE SHIELD | Source: Ambulatory Visit | Attending: Obstetrics and Gynecology | Admitting: Obstetrics and Gynecology

## 2023-05-29 VITALS — BP 110/71 | HR 74 | Ht 63.5 in | Wt 251.0 lb

## 2023-05-29 DIAGNOSIS — Z124 Encounter for screening for malignant neoplasm of cervix: Secondary | ICD-10-CM | POA: Diagnosis present

## 2023-05-29 DIAGNOSIS — B3731 Acute candidiasis of vulva and vagina: Secondary | ICD-10-CM

## 2023-05-29 DIAGNOSIS — Z01419 Encounter for gynecological examination (general) (routine) without abnormal findings: Secondary | ICD-10-CM | POA: Diagnosis not present

## 2023-05-29 DIAGNOSIS — Z803 Family history of malignant neoplasm of breast: Secondary | ICD-10-CM

## 2023-05-29 DIAGNOSIS — Z1151 Encounter for screening for human papillomavirus (HPV): Secondary | ICD-10-CM

## 2023-05-29 DIAGNOSIS — Z0000 Encounter for general adult medical examination without abnormal findings: Secondary | ICD-10-CM

## 2023-05-29 DIAGNOSIS — E282 Polycystic ovarian syndrome: Secondary | ICD-10-CM

## 2023-05-29 MED ORDER — FLUCONAZOLE 150 MG PO TABS: ORAL_TABLET | ORAL | 1 refills | Status: DC

## 2023-05-29 NOTE — Patient Instructions (Signed)
I value your feedback and you entrusting us with your care. If you get a [facility] patient survey, I would appreciate you taking the time to let us know about your experience today. Thank you! ? ? ?

## 2023-05-30 LAB — COMPREHENSIVE METABOLIC PANEL
ALT: 16 IU/L (ref 0–32)
AST: 20 IU/L (ref 0–40)
Albumin: 3.9 g/dL (ref 3.9–4.9)
Alkaline Phosphatase: 140 IU/L — ABNORMAL HIGH (ref 44–121)
BUN/Creatinine Ratio: 11 (ref 9–23)
BUN: 8 mg/dL (ref 6–20)
Bilirubin Total: <0.2 mg/dL (ref 0.0–1.2)
CO2: 22 mmol/L (ref 20–29)
Calcium: 8.6 mg/dL — ABNORMAL LOW (ref 8.7–10.2)
Chloride: 106 mmol/L (ref 96–106)
Creatinine, Ser: 0.7 mg/dL (ref 0.57–1.00)
Globulin, Total: 2.7 g/dL (ref 1.5–4.5)
Glucose: 70 mg/dL (ref 70–99)
Potassium: 4.6 mmol/L (ref 3.5–5.2)
Sodium: 143 mmol/L (ref 134–144)
Total Protein: 6.6 g/dL (ref 6.0–8.5)
eGFR: 115 mL/min/{1.73_m2} (ref >59)

## 2023-05-30 LAB — HEMOGLOBIN A1C
Est. average glucose Bld gHb Est-mCnc: 114 mg/dL
Hgb A1c MFr Bld: 5.6 % (ref 4.8–5.6)

## 2023-06-01 ENCOUNTER — Encounter: Payer: Self-pay | Admitting: Obstetrics and Gynecology

## 2023-06-03 ENCOUNTER — Ambulatory Visit: Payer: BLUE CROSS/BLUE SHIELD | Admitting: Psychology

## 2023-06-03 DIAGNOSIS — F89 Unspecified disorder of psychological development: Secondary | ICD-10-CM

## 2023-06-03 NOTE — Progress Notes (Signed)
Date: 06/03/2023   Appointment Start Time: 4pm Duration: 80 minutes Provider: Helmut Muster, PsyD Type of Session: Testing Appointment for Evaluation  Location of Patient: Home Location of Provider: Provider's Home (private office) Type of Contact: Caregility video visit with audio  Session Content: Today's appointment was a telepsychological visit. Jean Pitts is aware it is her responsibility to secure confidentiality on her end of the session. Prior to proceeding with today's appointment, Jean Pitts's physical location at the time of this appointment was obtained as well a phone number she could be reached at in the event of technical difficulties. Jean Pitts denied anyone else being present in the room or on the virtual appointment. This provider reviewed that video should not be captured, photos should not be taken, nor should testing stimuli be copied or recorded as it would be a copyright violation. Jean Pitts expressed understanding of the aforementioned, and verbally consented to proceed. The WAIS-IV was administered, scored, and interpreted by this evaluator. Jean Pitts is aware of the limitations of teleheath visits and verbally consented to proceed.  Billing codes will be input on the feedback appointment. There are no billing codes for the testing appointment.   Provisional DSM-5 diagnosis(es):  F89 Unspecified Disorder of Psychological Development   Plan: Jean Pitts was scheduled for a feedback appointment on 06/18/2023 at 11am via Caregility video visit with audio.                Jean Gouge, PsyD

## 2023-06-04 LAB — CYTOLOGY - PAP
Adequacy: ABSENT
Comment: NEGATIVE
Diagnosis: NEGATIVE
High risk HPV: NEGATIVE

## 2023-06-13 ENCOUNTER — Other Ambulatory Visit: Payer: Self-pay | Admitting: Primary Care

## 2023-06-13 DIAGNOSIS — E559 Vitamin D deficiency, unspecified: Secondary | ICD-10-CM

## 2023-06-17 NOTE — Progress Notes (Unsigned)
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 trauma and stress. Consulting with a physician regarding any changes to physical regimen is recommended. 16. "Failing at Normal: An ADHD Success Story" by Calvert Cantor is a great overview of ADHD. Dr. Janese Banks also has a YouTube channel with helpful videos on ADHD-related topics: http://www.mitchell-reyes.biz/ 17. Applications:   RescueTime. Tracks your activities on phone and/or computer to determine how productive you have been, and what distracted you. Free two week trial.   Focus@Will . Uses engineered audio that human voice-like frequencies. Free 15-day trial.  Freedom. Allows you to highlight days and times you want to block  yourself from certain sites or apps. Free trial.  Mint.  Allows you to input your bank accounts and creates a visual layout of various information about your financial goals, budget management, alerts, etc. Free.  Boomerang. Gives you the option to schedule times an email is sent as well as to see if others have received or opened your email. 10 messages free per month and a free trial of premium version.  IFTTT. Uses "channels" to create various actions (e.g., if you are mentioned in an email to highlight it in your inbox and if you miss a call to add it to a to-do list). Free and premium versions.  Unroll.me. Cleans up your email by unsubscribing from what you do not want to receive while still getting everything you do. Free.  Finish. Allows you to divide two-list tasks into short-term, mid-term, and long-term as well as how much time is left for a task. Focus mode hides non-priority tasks.   Autosilent. Turns your phone ringer on and off based on specified calendars, geo-fences, timers, etc. $3.99.  Freakyalarm. Makes you solve math problems to disable an alarm. $1.99.  Wake N Shake. Makes you vigorously shake your phone to stop the alarm. $.99.  Todoist. Allows you to add sub-tasks to tasks as well as includes email and Web plugins to make it work across system. Premium has location-based reminders, calendar sync, productive tracking, etc.   Sleep Cycle. Utilizes your phone's motion sensors to pick up on movement while you are asleep. The alarm will wake you as early as 30 minutes before your alarm based on your lightest phase of sleep as well as showing you how daily activities affect your sleep quality.  18. Books:  "Taking Charge of Adult ADHD Second Edition" by Dr. Janese Banks  "The ADHD Effect on Marriage" by Annamarie Dawley  "The Couples Guide to Thriving with ADHD" by Annamarie Dawley 19. Organizations that are a reliable source of information on ADHD:   Children and Adults with  Attention-Deficit/Hyperactivity Disorder (CHADD): chadd.org   Attention Deficit Disorder Association (ADDA): HotterNames.de  ADD Resources: addresources.org  ADD WareHouse: addwarehouse.com  World Federation of ADHD: adhd-federation.org  ADDConsults: FightListings.se.  Compilation of ADHD resources: https://www.harrell.com/ 20. Future evaluation if deemed necessary and/or to determine effectiveness of recommended interventions.   Helmut Muster, Psy.D. Licensed Psychologist - [facility]-P #[id number]               Margarite Gouge, PsyD

## 2023-06-18 ENCOUNTER — Ambulatory Visit (INDEPENDENT_AMBULATORY_CARE_PROVIDER_SITE_OTHER): Payer: BLUE CROSS/BLUE SHIELD | Admitting: Psychology

## 2023-06-18 DIAGNOSIS — F439 Reaction to severe stress, unspecified: Secondary | ICD-10-CM | POA: Diagnosis not present

## 2023-06-18 DIAGNOSIS — F902 Attention-deficit hyperactivity disorder, combined type: Secondary | ICD-10-CM

## 2023-06-19 ENCOUNTER — Encounter: Payer: Self-pay | Admitting: Oncology

## 2023-06-24 ENCOUNTER — Ambulatory Visit (INDEPENDENT_AMBULATORY_CARE_PROVIDER_SITE_OTHER): Payer: BLUE CROSS/BLUE SHIELD | Admitting: Primary Care

## 2023-06-24 VITALS — BP 112/68 | HR 65 | Temp 97.7°F | Ht 63.5 in | Wt 256.0 lb

## 2023-06-24 DIAGNOSIS — E559 Vitamin D deficiency, unspecified: Secondary | ICD-10-CM | POA: Diagnosis not present

## 2023-06-24 DIAGNOSIS — R6882 Decreased libido: Secondary | ICD-10-CM | POA: Diagnosis not present

## 2023-06-24 DIAGNOSIS — F902 Attention-deficit hyperactivity disorder, combined type: Secondary | ICD-10-CM

## 2023-06-24 MED ORDER — AMPHETAMINE-DEXTROAMPHET ER 10 MG PO CP24: 10.0000 mg | ORAL_CAPSULE | Freq: Every day | ORAL | 0 refills | Status: DC

## 2023-06-24 NOTE — Assessment & Plan Note (Signed)
Reviewed ADHD testing from [facility], D.  Discussed options for treatment, she would like to proceed with stimulant treatment.  Start Adderall XR 10 mg daily. Will complete controlled substance contract today. Urine drug screen due at next visit.  Follow-up in 3 to 4 weeks for reevaluation.

## 2023-06-24 NOTE — Progress Notes (Signed)
Subjective:    Patient ID: Jean Pitts, female    DOB: Nov 08, 1985, 37 y.o.   MRN: [id number]  HPI  Jean Pitts is a very pleasant 37 y.o. female with a history of chronic migraines, PCOS, morbid obesity, GAD, chronic fatigue, difficulty concentrating, MDD who presents today to discuss ADHD testing results.  She would also like "hormone levels" checked to evaluate for low libido and her PCOS.  Chronic low libido for many years, prior to fluoxetine.  Chronic history of fatigue, difficulty concentrating, anxiety, depression.  Evaluated on 05/27/2023 and 06/03/2023 for psychological evaluation of ADHD.  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  She would like to start ADHD treatment as her ADHD symptoms are impacting her home and work life. Symptoms include poor focus, poor memory, task incompletion, inattention.   Currently managed on fluoxetine 60 mg daily for anxiety and depression. Overall she doesn't feel like her anxiety is well managed. Symptoms include increased anxiety, irritability, feeling overwhelmed, poor libido. She does feel that her depression is controlled.    She's failed Zoloft, Lexapro, they were ineffective.    Review of Systems  Respiratory:  Negative for shortness of breath.   Cardiovascular:  Negative for chest pain and palpitations.  Neurological:  Negative for headaches.  Psychiatric/Behavioral:  Positive for decreased concentration. The patient is nervous/anxious.          Past Medical History:  Diagnosis Date   Abnormal glucose tolerance test in pregnancy, antepartum 06/04/2012   Formatting of this note might be different from the original. Needs 3h GTT   Absolute anemia 10/22/2012   Anemia    B12 deficiency    Black tarry stools 12/20/2020   BRCA negative 10/2021   MyRisk neg   Family history of adverse reaction to anesthesia    MATERNAL  AUNT-PT UNAWARE OF WHAT HAPPENED WITH ANESTHESIA BUT STATES HER AUNT HAS HAD PROBLEMS IN PAST    Family history of breast cancer 10/2021   IBIS=12.9%/riskscore=17.1%   Family history of ovarian cancer    Fecal occult blood test positive    Generalized abdominal pain 12/20/2020   Genital herpes    GERD (gastroesophageal reflux disease)    H/O cesarean section complicating pregnancy 04/19/2012   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/19/2021   Hair thinning 05/12/2018   Headache    MIGRAINE   Heartburn 10/26/2018   History of Roux-en-Y gastric bypass    Hyperlipidemia    Hypertension    Migraine    Obesity    PONV (postoperative nausea and vomiting)    Syncope 04/2015   Vitamin D deficiency    Yeast vaginitis 02/23/2019   Candida albicans on culture    Social History   Socioeconomic History   Marital status: Married    Spouse name: Not on file   Number of children: 2   Years of education: Not on file   Highest education level: Associate degree: occupational, Scientist, product/process development, or vocational program  Occupational History   Occupation: Immunologist   Tobacco Use   Smoking status: Never   Smokeless tobacco: Never  Vaping Use   Vaping status: Never Used  Substance and Sexual Activity   Alcohol use: Not Currently    Comment: RARE   Drug use: No   Sexual activity: Yes    Partners: Male    Birth control/protection: Surgical  Other Topics Concern   Not on file  Social History Narrative  She also goes to school full time at [employer]   Social Determinants of Health   Financial Resource Strain: High Risk (06/24/2023)   Overall Financial Resource Strain (CARDIA)    Difficulty of Paying Living Expenses: Hard  Food Insecurity: Food Insecurity Present (06/24/2023)   Hunger Vital Sign    Worried About Running Out of Food in the Last Year: Sometimes true    Ran Out of Food in the Last Year: Sometimes true  Transportation Needs: No Transportation Needs (06/24/2023)   PRAPARE -  Administrator, Civil Service (Medical): No    Lack of Transportation (Non-Medical): No  Physical Activity: Unknown (06/24/2023)   Exercise Vital Sign    Days of Exercise per Week: 0 days    Minutes of Exercise per Session: Not on file  Stress: Stress Concern Present (06/24/2023)   [employer] of Occupational Health - Occupational Stress Questionnaire    Feeling of Stress : Rather much  Social Connections: Moderately Isolated (06/24/2023)   Social Connection and Isolation Panel [NHANES]    Frequency of Communication with Friends and Family: Once a week    Frequency of Social Gatherings with Friends and Family: Once a week    Attends Religious Services: 1 to 4 times per year    Active Member of [employer] or Organizations: No    Attends Banker Meetings: Not on file    Marital Status: Married  Catering manager Violence: Not At Risk (05/12/2018)   Humiliation, Afraid, Rape, and Kick questionnaire    Fear of Current or Ex-Partner: No    Emotionally Abused: No    Physically Abused: No    Sexually Abused: No    Past Surgical History:  Procedure Laterality Date   APPENDECTOMY     BUNIONECTOMY     CESAREAN SECTION     X2   COLONOSCOPY WITH PROPOFOL N/A 12/31/2020   Procedure: COLONOSCOPY WITH PROPOFOL. eleview and spot ink tattoo used.;  Surgeon: Pasty Spillers, MD;  Location: [facility];  Service: Endoscopy;  Laterality: N/A;   COLONOSCOPY WITH PROPOFOL N/A 09/18/2022   Procedure: COLONOSCOPY WITH PROPOFOL;  Surgeon: Toney Reil, MD;  Location: [facility];  Service: Endoscopy;  Laterality: N/A;   DIAGNOSTIC LAPAROSCOPY     ENDOMETRIAL BIOPSY     ENTEROSCOPY N/A 01/23/2021   Procedure: ENTEROSCOPY;  Surgeon: Pasty Spillers, MD;  Location: [facility];  Service: Endoscopy;  Laterality: N/A;   ENTEROSCOPY N/A 02/04/2021   Procedure: ENTEROSCOPY;  Surgeon: Meryl Dare, MD;  Location: [facility];  Service: Gastroenterology;   Laterality: N/A;   ESOPHAGOGASTRODUODENOSCOPY (EGD) WITH PROPOFOL N/A 12/31/2020   Procedure: ESOPHAGOGASTRODUODENOSCOPY (EGD) WITH PROPOFOL;  Surgeon: Pasty Spillers, MD;  Location: [facility];  Service: Endoscopy;  Laterality: N/A;   FOREIGN BODY REMOVAL  02/04/2021   Procedure: FOREIGN BODY REMOVAL;  Surgeon: Meryl Dare, MD;  Location: [facility];  Service: Gastroenterology;;  staples from other gastric procedure   GASTRIC BYPASS     GIVENS CAPSULE STUDY N/A 02/19/2021   Procedure: GIVENS CAPSULE STUDY;  Surgeon: Pasty Spillers, MD;  Location: [facility];  Service: Endoscopy;  Laterality: N/A;   LASIK     MOUTH SURGERY     Tongue Cyst   NOVASURE ABLATION N/A 06/29/2015   Procedure: DILATION AND CURETTAGE, HYSTEROSCOPY, NOVASURE ABLATION;  Surgeon: Vena Austria, MD;  Location: [facility];  Service: Gynecology;  Laterality: N/A;   POLYPECTOMY  09/18/2022   Procedure: POLYPECTOMY;  Surgeon: Toney Reil, MD;  Location: [facility];  Service: Endoscopy;;   TUBAL LIGATION     WISDOM TOOTH EXTRACTION      Family History  Problem Relation Age of Onset   Hemangiomas Mother        benign osseous 2022 MRI   Migraines Mother    Obesity Mother    Hyperlipidemia Mother    Obesity Sister    Obesity Sister    Obesity Brother    Ovarian cancer Maternal Grandmother 26       Cervical/Sqammacell   Breast cancer Maternal Aunt    Neuropathy Maternal Uncle    Breast cancer Maternal Great-grandmother    Skin cancer Maternal Great-grandmother     Allergies  Allergen Reactions   Augmentin [Amoxicillin-Pot Clavulanate]     diarrhea   Codeine Other (See Comments)    Other reaction(s): Other (See Comments)   Hydrocodone-Acetaminophen Nausea And Vomiting    Current Outpatient Medications on File Prior to Visit  Medication Sig Dispense Refill   Atogepant (QULIPTA) 60 MG TABS Take 1 tablet (60 mg total) by mouth daily. Please run copay card: [id number] [id number]  PCN [facility] GRP [id number] ID [id number] 30 tablet 11   Botulinum Toxin Type A (BOTOX) 200 units SOLR Provider to inject 155 units into the muscles of the head and neck every 3 months. Discard remainder. 1 each 3   cyclobenzaprine (FLEXERIL) 5 MG tablet TAKE 1 TABLET BY MOUTH ONCE DAILY AS NEEDED FOR MIGRAINES. 30 tablet 0   fluconazole (DIFLUCAN) 150 MG tablet Take 1 tab for 1 dose prn sx or weekly as preventive 10 tablet 1   FLUoxetine (PROZAC) 20 MG tablet Take 3 tablets (60 mg total) by mouth daily. for anxiety and depression. 270 tablet 2   NURTEC 75 MG TBDP TAKE 1 TABLET (75 MG TOTAL) BY MOUTH DAILY AS NEEDED. FOR MIGRAINES. TAKE AS CLOSE TO ONSET OF MIGRAINE AS POSSIBLE. ONE DAILY MAXIMUM. 16 tablet 11   ondansetron (ZOFRAN) 4 MG tablet Take 1 tablet (4 mg total) by mouth every 8 (eight) hours as needed for up to 20 doses for nausea or vomiting. 20 tablet 0   SUMAtriptan (IMITREX) 6 MG/0.5ML SOLN injection INJECT 6 MG INTO THE SKIN AT MIGRAINE ONSET, MAY REPEAT WITH 6 MG 2 HOURS LATER IF MIGRAINE PERSISTS. MAX 2 DOSES PER 24 HOURS. 5 mL 5   Syringe/Needle, Disp, (BD SAFETYGLIDE SYRINGE/NEEDLE) 27G X 5/8" 1 ML MISC Attach needle to syringe and use to draw up and administer a single dose of Sumatriptan into the skin. Do not reuse. 10 each 5   Vitamin D, Ergocalciferol, (DRISDOL) 1.25 MG (50000 UNIT) CAPS capsule TAKE 1 CAPSULE (50,000 UNITS TOTAL) BY MOUTH EVERY 7 (SEVEN) DAYS. FOR VITAMIN D. (Patient not taking: Reported on 06/24/2023) 12 capsule 0   No current facility-administered medications on file prior to visit.    BP 112/68   Pulse 65   Temp 97.7 F (36.5 C) (Temporal)   Ht 5' 3.5" (1.613 m)   Wt 256 lb (116.1 kg)   LMP 05/04/2023 (Approximate)   SpO2 99%   BMI 44.64 kg/m  Objective:   Physical Exam Cardiovascular:     Rate and Rhythm: Normal rate and regular rhythm.  Pulmonary:     Effort: Pulmonary effort is normal.     Breath sounds: Normal breath sounds.  Musculoskeletal:      Cervical back: Neck supple.  Skin:    General: Skin is warm and dry.  Assessment & Plan:  ADHD (attention deficit hyperactivity disorder), combined type Assessment & Plan: Reviewed [facility] testing [facility].  Discussed options for treatment, she would like to proceed with stimulant treatment.  Start Adderall XR 10 mg daily. Will complete controlled substance contract today. Urine drug screen due at next visit.  Follow-up in 3 to 4 weeks for reevaluation.  Orders: -     Amphetamine-Dextroamphet ER; Take 1 capsule (10 mg total) by mouth daily.  Dispense: 30 capsule; Refill: 0  Vitamin D deficiency Assessment & Plan: Repeat vitamin D level pending. Continue vitamin D 50,000 IU capsules weekly for now.  Orders: -     VITAMIN D 25 Hydroxy (Vit-D Deficiency, Fractures)  Low libido Assessment & Plan: Discussed that hormone labs would likely not provide [place] with information regarding her low libido or PCOS. Reviewed recent CMP from GYN.  We discussed that factors such as obesity, depression, anxiety, antidepressant medicines can contribute to low libido.  I did agree to order labs but recommended that she run these results by her GYN.  Orders: -     Estrogens, total -     Follicle stimulating hormone -     Testosterone -     Follicle stimulating hormone        Doreene Nest, NP

## 2023-06-24 NOTE — Patient Instructions (Signed)
Stop by the lab prior to leaving today. I will notify you of your results once received.   Start Adderall XR 10 mg daily every morning.  Schedule a follow-up visit for 3 to 4 weeks.  It was a pleasure to see you today!

## 2023-06-24 NOTE — Assessment & Plan Note (Signed)
Discussed that hormone labs would likely not provide [place] with information regarding her low libido or PCOS. Reviewed recent CMP from GYN.  We discussed that factors such as obesity, depression, anxiety, antidepressant medicines can contribute to low libido.  I did agree to order labs but recommended that she run these results by her GYN.

## 2023-06-24 NOTE — Assessment & Plan Note (Signed)
Repeat vitamin D level pending. Continue vitamin D 50,000 IU capsules weekly for now.

## 2023-06-25 DIAGNOSIS — E559 Vitamin D deficiency, unspecified: Secondary | ICD-10-CM

## 2023-06-25 LAB — FOLLICLE STIMULATING HORMONE: FSH: 5.7 m[IU]/mL

## 2023-06-25 LAB — TESTOSTERONE: Testosterone: 36.01 ng/dL (ref 15.00–40.00)

## 2023-06-25 LAB — VITAMIN D 25 HYDROXY (VIT D DEFICIENCY, FRACTURES): VITD: 14.07 ng/mL — ABNORMAL LOW (ref 30.00–100.00)

## 2023-06-26 MED ORDER — VITAMIN D (ERGOCALCIFEROL) 1.25 MG (50000 UNIT) PO CAPS: 50000.0000 [IU] | ORAL_CAPSULE | ORAL | 0 refills | Status: DC

## 2023-06-30 LAB — ESTROGENS, TOTAL: Estrogen: 283 pg/mL

## 2023-07-07 ENCOUNTER — Telehealth: Payer: Self-pay | Admitting: Adult Health

## 2023-07-07 NOTE — Telephone Encounter (Signed)
Completed [employer] [place] renewal form and faxed to [phone].

## 2023-07-10 DIAGNOSIS — F902 Attention-deficit hyperactivity disorder, combined type: Secondary | ICD-10-CM

## 2023-07-10 MED ORDER — AMPHETAMINE-DEXTROAMPHET ER 15 MG PO CP24: 15.0000 mg | ORAL_CAPSULE | ORAL | 0 refills | Status: DC

## 2023-07-12 ENCOUNTER — Encounter: Payer: Self-pay | Admitting: Obstetrics and Gynecology

## 2023-07-15 NOTE — Telephone Encounter (Signed)
Received fax of approval. Auth#: [id number] (07/18/23-07/16/24). Pt will continue to be buy/bill.

## 2023-07-17 ENCOUNTER — Ambulatory Visit (INDEPENDENT_AMBULATORY_CARE_PROVIDER_SITE_OTHER): Payer: BLUE CROSS/BLUE SHIELD | Admitting: Primary Care

## 2023-07-17 ENCOUNTER — Encounter: Payer: Self-pay | Admitting: Primary Care

## 2023-07-17 VITALS — BP 116/82 | HR 88 | Temp 97.3°F | Ht 63.5 in | Wt 250.0 lb

## 2023-07-17 DIAGNOSIS — L719 Rosacea, unspecified: Secondary | ICD-10-CM | POA: Diagnosis not present

## 2023-07-17 DIAGNOSIS — Z79899 Other long term (current) drug therapy: Secondary | ICD-10-CM

## 2023-07-17 DIAGNOSIS — F902 Attention-deficit hyperactivity disorder, combined type: Secondary | ICD-10-CM | POA: Diagnosis not present

## 2023-07-17 MED ORDER — AMPHETAMINE-DEXTROAMPHET ER 30 MG PO CP24: 30.0000 mg | ORAL_CAPSULE | ORAL | 0 refills | Status: DC

## 2023-07-17 MED ORDER — METRONIDAZOLE 0.75 % EX GEL: 1.0000 | Freq: Two times a day (BID) | CUTANEOUS | 0 refills | Status: AC

## 2023-07-17 NOTE — Progress Notes (Signed)
Subjective:    Patient ID: Jean Pitts, female    DOB: 1986/05/19, 37 y.o.   MRN: [id number]  HPI  Jean Pitts is a very pleasant 36 y.o. female with a history of chronic migraines, PCOS, hyperlipidemia, GAD, ADHD, MDD who presents today for follow-up of ADHD and to discuss facial rash.  1) ADHD: Formally diagnosed with ADHD in September 2024 for psychological evaluation.  Initiated on Adderall XR 10 mg daily 1 month ago for symptoms.  A few weeks ago she reached out reporting no improvement in symptoms so her dose was increased to 15 mg daily.  Unfortunately, the pharmacy was unable to get this medication so she was asked to double her dose to 20 mg daily.  Today she discusses that she's been taking 20 mg of Adderall XR for 1 week and hasn't noticed improvement in symptoms. She still feels "frazzled at work".  She was told by her bariatric surgeon that she may need an increased dose as she does not absorb medications normally.  She has noticed a decrease in appetite which is a favored response. She denies chest pain, palpitations, dizziness.   2) Facial Rash: Chronic to bilateral cheeks with erythema for about 1 year.  She has tried several over-the-counter products including her regular facial moisturizer without improvement.  She does not see dermatology.   Review of Systems  Respiratory:  Negative for shortness of breath.   Cardiovascular:  Negative for chest pain and palpitations.  Neurological:  Negative for dizziness.  Psychiatric/Behavioral:  Positive for decreased concentration.          Past Medical History:  Diagnosis Date   Abnormal glucose tolerance test in pregnancy, antepartum 06/04/2012   Formatting of this note might be different from the original. Needs 3h GTT   Absolute anemia 10/22/2012   Anemia    B12 deficiency    Black tarry stools 12/20/2020   BRCA negative 10/2021   MyRisk neg   Family history of adverse reaction to anesthesia    MATERNAL  AUNT-PT UNAWARE OF WHAT HAPPENED WITH ANESTHESIA BUT STATES HER AUNT HAS HAD PROBLEMS IN PAST    Family history of breast cancer 10/2021   IBIS=12.9%/riskscore=17.1%   Family history of ovarian cancer    Fecal occult blood test positive    Generalized abdominal pain 12/20/2020   Genital herpes    GERD (gastroesophageal reflux disease)    H/O cesarean section complicating pregnancy 04/19/2012   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/19/2021   Hair thinning 05/12/2018   Headache    MIGRAINE   Heartburn 10/26/2018   History of Roux-en-Y gastric bypass    Hyperlipidemia    Hypertension    Migraine    Obesity    PONV (postoperative nausea and vomiting)    Syncope 04/2015   Vitamin D deficiency    Yeast vaginitis 02/23/2019   Candida albicans on culture    Social History   Socioeconomic History   Marital status: Married    Spouse name: Not on file   Number of children: 2   Years of education: Not on file   Highest education level: Associate degree: academic program  Occupational History   Occupation: Immunologist   Tobacco Use   Smoking status: Never   Smokeless tobacco: Never  Vaping Use   Vaping status: Never Used  Substance and Sexual Activity   Alcohol use: Not Currently    Comment: RARE   Drug use: No   Sexual activity: Yes  Partners: Male    Birth control/protection: Surgical  Other Topics Concern   Not on file  Social History Narrative      She also goes to school full time at [employer]   Social Determinants of Health   Financial Resource Strain: High Risk (06/24/2023)   Overall Financial Resource Strain (CARDIA)    Difficulty of Paying Living Expenses: Hard  Food Insecurity: Food Insecurity Present (06/24/2023)   Hunger Vital Sign    Worried About Running Out of Food in the Last Year: Sometimes true    Ran Out of Food in the Last Year: Sometimes true  Transportation Needs: No Transportation Needs (07/13/2023)   PRAPARE - Administrator, Civil Service  (Medical): No    Lack of Transportation (Non-Medical): No  Physical Activity: Insufficiently Active (07/13/2023)   Exercise Vital Sign    Days of Exercise per Week: 2 days    Minutes of Exercise per Session: 10 min  Stress: Stress Concern Present (07/13/2023)   [employer] of Occupational Health - Occupational Stress Questionnaire    Feeling of Stress : Rather much  Social Connections: Moderately Isolated (07/13/2023)   Social Connection and Isolation Panel [NHANES]    Frequency of Communication with Friends and Family: Once a week    Frequency of Social Gatherings with Friends and Family: Once a week    Attends Religious Services: 1 to 4 times per year    Active Member of [employer] or Organizations: No    Attends Banker Meetings: Not on file    Marital Status: Married  Catering manager Violence: Not At Risk (05/12/2018)   Humiliation, Afraid, Rape, and Kick questionnaire    Fear of Current or Ex-Partner: No    Emotionally Abused: No    Physically Abused: No    Sexually Abused: No    Past Surgical History:  Procedure Laterality Date   APPENDECTOMY     BUNIONECTOMY     CESAREAN SECTION     X2   COLONOSCOPY WITH PROPOFOL N/A 12/31/2020   Procedure: COLONOSCOPY WITH PROPOFOL. eleview and spot ink tattoo used.;  Surgeon: Pasty Spillers, MD;  Location: [facility];  Service: Endoscopy;  Laterality: N/A;   COLONOSCOPY WITH PROPOFOL N/A 09/18/2022   Procedure: COLONOSCOPY WITH PROPOFOL;  Surgeon: Toney Reil, MD;  Location: [facility];  Service: Endoscopy;  Laterality: N/A;   DIAGNOSTIC LAPAROSCOPY     ENDOMETRIAL BIOPSY     ENTEROSCOPY N/A 01/23/2021   Procedure: ENTEROSCOPY;  Surgeon: Pasty Spillers, MD;  Location: [facility];  Service: Endoscopy;  Laterality: N/A;   ENTEROSCOPY N/A 02/04/2021   Procedure: ENTEROSCOPY;  Surgeon: Meryl Dare, MD;  Location: [facility];  Service: Gastroenterology;  Laterality: N/A;    ESOPHAGOGASTRODUODENOSCOPY (EGD) WITH PROPOFOL N/A 12/31/2020   Procedure: ESOPHAGOGASTRODUODENOSCOPY (EGD) WITH PROPOFOL;  Surgeon: Pasty Spillers, MD;  Location: [facility];  Service: Endoscopy;  Laterality: N/A;   FOREIGN BODY REMOVAL  02/04/2021   Procedure: FOREIGN BODY REMOVAL;  Surgeon: Meryl Dare, MD;  Location: [facility];  Service: Gastroenterology;;  staples from other gastric procedure   GASTRIC BYPASS     GIVENS CAPSULE STUDY N/A 02/19/2021   Procedure: GIVENS CAPSULE STUDY;  Surgeon: Pasty Spillers, MD;  Location: [facility];  Service: Endoscopy;  Laterality: N/A;   LASIK     MOUTH SURGERY     Tongue Cyst   NOVASURE ABLATION N/A 06/29/2015   Procedure: DILATION AND CURETTAGE, HYSTEROSCOPY, NOVASURE  ABLATION;  Surgeon: Vena Austria, MD;  Location: [facility];  Service: Gynecology;  Laterality: N/A;   POLYPECTOMY  09/18/2022   Procedure: POLYPECTOMY;  Surgeon: Toney Reil, MD;  Location: [facility];  Service: Endoscopy;;   TUBAL LIGATION     WISDOM TOOTH EXTRACTION      Family History  Problem Relation Age of Onset   Hemangiomas Mother        benign osseous 2022 MRI   Migraines Mother    Obesity Mother    Hyperlipidemia Mother    Obesity Sister    Obesity Sister    Obesity Brother    Ovarian cancer Maternal Grandmother 26       Cervical/Sqammacell   Breast cancer Maternal Aunt    Neuropathy Maternal Uncle    Breast cancer Maternal Great-grandmother    Skin cancer Maternal Great-grandmother     Allergies  Allergen Reactions   Augmentin [Amoxicillin-Pot Clavulanate]     diarrhea   Codeine Other (See Comments)    Other reaction(s): Other (See Comments)   Hydrocodone-Acetaminophen Nausea And Vomiting    Current Outpatient Medications on File Prior to Visit  Medication Sig Dispense Refill   Atogepant (QULIPTA) 60 MG TABS Take 1 tablet (60 mg total) by mouth daily. Please run copay card: BIN [id number] PCN [facility]CP GRP  [id number] ID [id number] 30 tablet 11   Botulinum Toxin Type A (BOTOX) 200 units SOLR Provider to inject 155 units into the muscles of the head and neck every 3 months. Discard remainder. 1 each 3   cyclobenzaprine (FLEXERIL) 5 MG tablet TAKE 1 TABLET BY MOUTH ONCE DAILY AS NEEDED FOR MIGRAINES. 30 tablet 0   fluconazole (DIFLUCAN) 150 MG tablet Take 1 tab for 1 dose prn sx or weekly as preventive 10 tablet 1   FLUoxetine (PROZAC) 20 MG tablet Take 3 tablets (60 mg total) by mouth daily. for anxiety and depression. 270 tablet 2   NURTEC 75 MG TBDP TAKE 1 TABLET (75 MG TOTAL) BY MOUTH DAILY AS NEEDED. FOR MIGRAINES. TAKE AS CLOSE TO ONSET OF MIGRAINE AS POSSIBLE. ONE DAILY MAXIMUM. 16 tablet 11   ondansetron (ZOFRAN) 4 MG tablet Take 1 tablet (4 mg total) by mouth every 8 (eight) hours as needed for up to 20 doses for nausea or vomiting. 20 tablet 0   SUMAtriptan (IMITREX) 6 MG/0.5ML SOLN injection INJECT 6 MG INTO THE SKIN AT MIGRAINE ONSET, MAY REPEAT WITH 6 MG 2 HOURS LATER IF MIGRAINE PERSISTS. MAX 2 DOSES PER 24 HOURS. 5 mL 5   Syringe/Needle, Disp, (BD SAFETYGLIDE SYRINGE/NEEDLE) 27G X 5/8" 1 ML MISC Attach needle to syringe and use to draw up and administer a single dose of Sumatriptan into the skin. Do not reuse. 10 each 5   Vitamin D, Ergocalciferol, (DRISDOL) 1.25 MG (50000 UNIT) CAPS capsule Take 1 capsule (50,000 Units total) by mouth every 7 (seven) days. For vitamin D. 12 capsule 0   No current facility-administered medications on file prior to visit.    BP 116/82   Pulse 88   Temp (!) 97.3 F (36.3 C) (Temporal)   Ht 5' 3.5" (1.613 m)   Wt 250 lb (113.4 kg)   SpO2 99%   BMI 43.59 kg/m  Objective:   Physical Exam Cardiovascular:     Rate and Rhythm: Normal rate and regular rhythm.  Pulmonary:     Effort: Pulmonary effort is normal.     Breath sounds: Normal breath sounds.  Musculoskeletal:  Cervical back: Neck supple.  Skin:    General: Skin is warm and dry.   Neurological:     Mental Status: She is alert and oriented to person, place, and time.  Psychiatric:        Mood and Affect: Mood normal.           Assessment & Plan:  ADHD (attention deficit hyperactivity disorder), combined type Assessment & Plan: Remains uncontrolled despite higher doses.  Long discussion today regarding treatment options. For now, we will increase her Adderall XR to 30 mg daily. If no improvement, then switch to Vyvanse versus immediate release Adderall.  Urine drug screen obtained today. Controlled substance contract updated.  She will update in a few weeks.  Orders: -     Amphetamine-Dextroamphet ER; Take 1 capsule (30 mg total) by mouth every morning.  Dispense: 30 capsule; Refill: 0 -     DRUG MONITORING, PANEL 8 WITH CONFIRMATION, URINE  Rosacea Assessment & Plan: Exam today suggestive of rosacea.  Start metronidazole 0.75% gel twice daily. She will update.  Orders: -     metroNIDAZOLE; Apply 1 Application topically 2 (two) times daily.  Dispense: 45 g; Refill: 0  High risk medication use -     DRUG MONITORING, PANEL 8 WITH CONFIRMATION, URINE        Doreene Nest, NP

## 2023-07-17 NOTE — Patient Instructions (Signed)
We increased the dose of your Adderall XR to 30 mg daily.  Start metronidazole 0.75% gel.  Apply twice daily after washing.  Please update me as discussed.  It was a pleasure to see you today!

## 2023-07-17 NOTE — Assessment & Plan Note (Signed)
Remains uncontrolled despite higher doses.  Long discussion today regarding treatment options. For now, we will increase her Adderall XR to 30 mg daily. If no improvement, then switch to Vyvanse versus immediate release Adderall.  Urine drug screen obtained today. Controlled substance contract updated.  She will update in a few weeks.

## 2023-07-17 NOTE — Assessment & Plan Note (Signed)
Exam today suggestive of rosacea.  Start metronidazole 0.75% gel twice daily. She will update.

## 2023-07-19 LAB — DRUG MONITORING, PANEL 8 WITH CONFIRMATION, URINE
6 Acetylmorphine: NEGATIVE ng/mL (ref <10)
Alcohol Metabolites: NEGATIVE ng/mL (ref <500)
Amphetamine: 3471 ng/mL — ABNORMAL HIGH (ref <250)
Amphetamines: POSITIVE ng/mL — AB (ref <500)
Benzodiazepines: NEGATIVE ng/mL (ref <100)
Buprenorphine, Urine: NEGATIVE ng/mL (ref <5)
Cocaine Metabolite: NEGATIVE ng/mL (ref <150)
Creatinine: 100.5 mg/dL (ref >=20.0)
MDMA: NEGATIVE ng/mL (ref <500)
Marijuana Metabolite: NEGATIVE ng/mL (ref <20)
Methamphetamine: NEGATIVE ng/mL (ref <250)
Opiates: NEGATIVE ng/mL (ref <100)
Oxidant: NEGATIVE ug/mL (ref <200)
Oxycodone: NEGATIVE ng/mL (ref <100)
pH: 7.4 (ref 4.5–9.0)

## 2023-07-19 LAB — DM TEMPLATE

## 2023-07-29 ENCOUNTER — Telehealth: Payer: Self-pay | Admitting: Adult Health

## 2023-07-29 ENCOUNTER — Ambulatory Visit (INDEPENDENT_AMBULATORY_CARE_PROVIDER_SITE_OTHER): Payer: BLUE CROSS/BLUE SHIELD | Admitting: Adult Health

## 2023-07-29 ENCOUNTER — Encounter: Payer: Self-pay | Admitting: Oncology

## 2023-07-29 DIAGNOSIS — G43709 Chronic migraine without aura, not intractable, without status migrainosus: Secondary | ICD-10-CM | POA: Diagnosis not present

## 2023-07-29 MED ORDER — ONABOTULINUMTOXINA 100 UNITS IJ SOLR
155.0000 [IU] | Freq: Once | INTRAMUSCULAR | Status: AC
Start: 2023-07-29 — End: 2023-07-29
  Administered 2023-07-29: 165 [IU] via INTRAMUSCULAR

## 2023-07-29 NOTE — Progress Notes (Signed)
Botox- 100 units x 2 vial Lot: [id number] Expiration: 11/2025 NDC: 5409-8119-14  Bacteriostatic 0.9% Sodium Chloride- 4mL total [id number] Expiration: 12/29/23 NDC: 0865-7846-96  Dx: E95.284 BB  Witnessed by: Hollice Espy

## 2023-07-29 NOTE — Progress Notes (Signed)
Patient is being seen for repeat Botox injection.  Prior injection 04/22/2023 with Dr. Lucia Gaskins and started on Qulipta. Reports improvement of migraine frequency and severity, currently having about 3-6 migraines per month (prior to botox >15 migraines per month). Continues to have dull tension type headaches almost daily. Does have increased migraines about 1-2 weeks prior to next injection. Use of Nurtec with benefit. She does clench her jaw which can worsen her headaches.  Discussed potentially switching to [facility], provided patient with additional information, she will further consider and call office if interested in pursuing otherwise will return in 3 months for repeat injection.      Consent Form Botulism Toxin Injection For Chronic Migraine    Reviewed orally with patient, additionally signature is on file:  Botulism toxin has been approved by the Federal drug administration for treatment of chronic migraine. Botulism toxin does not cure chronic migraine and it may not be effective in some patients.  The administration of botulism toxin is accomplished by injecting a small amount of toxin into the muscles of the neck and head. Dosage must be titrated for each individual. Any benefits resulting from botulism toxin tend to wear off after 3 months with a repeat injection required if benefit is to be maintained. Injections are usually done every 3-4 months with maximum effect peak achieved by about 2 or 3 weeks. Botulism toxin is expensive and you should be sure of what costs you will incur resulting from the injection.  The side effects of botulism toxin use for chronic migraine may include:   -Transient, and usually mild, facial weakness with facial injections  -Transient, and usually mild, head or neck weakness with head/neck injections  -Reduction or loss of forehead facial animation due to forehead muscle weakness  -Eyelid drooping  -Dry eye  -Pain at the site of injection or  bruising at the site of injection  -Double vision  -Potential unknown long term risks   Contraindications: You should not have Botox if you are pregnant, nursing, allergic to albumin, have an infection, skin condition, or muscle weakness at the site of the injection, or have myasthenia gravis, Lambert-Eaton syndrome, or ALS.  It is also possible that as with any injection, there may be an allergic reaction or no effect from the medication. Reduced effectiveness after repeated injections is sometimes seen and rarely infection at the injection site may occur. All care will be taken to prevent these side effects. If therapy is given over a long time, atrophy and wasting in the muscle injected may occur. Occasionally the patient's become refractory to treatment because they develop antibodies to the toxin. In this event, therapy needs to be modified.  I have read the above information and consent to the administration of botulism toxin.    BOTOX PROCEDURE NOTE FOR MIGRAINE HEADACHE  Contraindications and precautions discussed with patient(above). Aseptic procedure was observed and patient tolerated procedure. Procedure performed by Ihor Austin, AGNP-BC.   The condition has existed for more than 6 months, and pt does not have a diagnosis of ALS, Myasthenia Gravis or Lambert-Eaton Syndrome.  Risks and benefits of injections discussed and pt agrees to proceed with the procedure.  Written consent obtained  These injections are medically necessary. Pt  receives good benefits from these injections. These injections do not cause sedations or hallucinations which the oral therapies may cause.   Description of procedure:  The patient was placed in a sitting position. The standard protocol was used for Botox  as follows, with 5 units of Botox injected at each site:  -Procerus muscle, midline injection  -Corrugator muscle, bilateral injection  -Frontalis muscle, bilateral injection, with 2 sites each  side, medial injection was performed in the upper one third of the frontalis muscle, in the region vertical from the medial inferior edge of the superior orbital rim. The lateral injection was again in the upper one third of the forehead vertically above the lateral limbus of the cornea, 1.5 cm lateral to the medial injection site.  -Temporalis muscle injection, 4 sites, bilaterally. The first injection was 3 cm above the tragus of the ear, second injection site was 1.5 cm to 3 cm up from the first injection site in line with the tragus of the ear. The third injection site was 1.5-3 cm forward between the first 2 injection sites. The fourth injection site was 1.5 cm posterior to the second injection site. 5th site laterally in the temporalis  muscleat the level of the outer canthus.  -Occipitalis muscle injection, 3 sites, bilaterally. The first injection was done one half way between the occipital protuberance and the tip of the mastoid process behind the ear. The second injection site was done lateral and superior to the first, 1 fingerbreadth from the first injection. The third injection site was 1 fingerbreadth superiorly and medially from the first injection site.  -Cervical paraspinal muscle injection, 2 sites, bilaterally. The first injection site was 1 cm from the midline of the cervical spine, 3 cm inferior to the lower border of the occipital protuberance. The second injection site was 1.5 cm superiorly and laterally to the first injection site.  -Trapezius muscle injection was performed at 3 sites, bilaterally. The first injection site was in the upper trapezius muscle halfway between the inflection point of the neck, and the acromion. The second injection site was one half way between the acromion and the first injection site. The third injection was done between the first injection site and the inflection point of the neck.  -Masseter muscle injection, 1 site bilaterally     A total of 200  units of Botox was prepared, 165 units of Botox was injected as documented above, any Botox not injected was wasted. The patient tolerated the procedure well, there were no complications of the above procedure.   Ihor Austin, AGNP-BC  [facility] [address] [place], [place] [address]  Phone [phone] Fax [phone] Note: This document was prepared with digital dictation and possible smart phrase technology. Any transcriptional errors that result from this process are unintentional.

## 2023-07-29 NOTE — Telephone Encounter (Signed)
Pt is asking for the EOB from today's Botox appt to be mailed to her when it's ready.

## 2023-08-17 DIAGNOSIS — F902 Attention-deficit hyperactivity disorder, combined type: Secondary | ICD-10-CM

## 2023-08-19 MED ORDER — AMPHETAMINE-DEXTROAMPHET ER 30 MG PO CP24
30.0000 mg | ORAL_CAPSULE | ORAL | 0 refills | Status: DC
Start: 2023-08-19 — End: 2023-10-30

## 2023-08-26 ENCOUNTER — Other Ambulatory Visit: Payer: Self-pay | Admitting: Neurology

## 2023-08-26 DIAGNOSIS — G43009 Migraine without aura, not intractable, without status migrainosus: Secondary | ICD-10-CM

## 2023-09-18 ENCOUNTER — Other Ambulatory Visit: Payer: Self-pay | Admitting: Primary Care

## 2023-09-18 DIAGNOSIS — E559 Vitamin D deficiency, unspecified: Secondary | ICD-10-CM

## 2023-10-21 ENCOUNTER — Ambulatory Visit: Payer: BLUE CROSS/BLUE SHIELD | Admitting: Adult Health

## 2023-10-22 ENCOUNTER — Ambulatory Visit (INDEPENDENT_AMBULATORY_CARE_PROVIDER_SITE_OTHER): Payer: BLUE CROSS/BLUE SHIELD | Admitting: Neurology

## 2023-10-22 DIAGNOSIS — G43709 Chronic migraine without aura, not intractable, without status migrainosus: Secondary | ICD-10-CM | POA: Diagnosis not present

## 2023-10-22 DIAGNOSIS — R519 Headache, unspecified: Secondary | ICD-10-CM

## 2023-10-22 DIAGNOSIS — G43009 Migraine without aura, not intractable, without status migrainosus: Secondary | ICD-10-CM

## 2023-10-22 MED ORDER — ONABOTULINUMTOXINA 100 UNITS IJ SOLR
155.0000 [IU] | Freq: Once | INTRAMUSCULAR | Status: AC
Start: 2023-10-22 — End: 2023-10-22
  Administered 2023-10-22: 155 [IU] via INTRAMUSCULAR

## 2023-10-22 MED ORDER — ONABOTULINUMTOXINA 100 UNITS IJ SOLR
155.0000 [IU] | Freq: Once | INTRAMUSCULAR | Status: DC
Start: 2023-10-22 — End: 2023-10-22

## 2023-10-22 NOTE — Progress Notes (Signed)
Botox- 100 units x 2 vials Lot: [id number] Expiration: 12/2025 NDC: 4540-9811-91  Bacteriostatic 0.9% Sodium Chloride- 2 mL  Lot: [id number] Expiration: 12/29/2023 NDC: 6213-0865-78  Dx: 43.709 B/B Witnessed by Alverda Skeans, RN

## 2023-10-22 NOTE — Progress Notes (Signed)
10/21/2022: stable. Patient feels that her migraines cause her jaw to ache in her jaw aching also can cause her migraines to worsen it is a trigger for migraines included 5 units in each masseter to see if that helps with migraine severity.   07/29/2023: Patient is being seen for repeat Botox injection.  Prior injection 04/22/2023 with Dr. Lucia Gaskins and started on Qulipta. Reports improvement of migraine frequency and severity, currently having about 3-6 migraines per month (prior to botox >15 migraines per month). Continues to have dull tension type headaches almost daily. Does have increased migraines about 1-2 weeks prior to next injection. Use of Nurtec with benefit. She does clench her jaw which can worsen her headaches.  Discussed potentially switching to [facility], provided patient with additional information, she will further consider and call office if interested in pursuing otherwise will return in 3 months for repeat injection.  Meds ordered this encounter  Medications   DISCONTD: botulinum toxin Type A (BOTOX) injection 155 Units   botulinum toxin Type A (BOTOX) injection 155 Units         Consent Form Botulism Toxin Injection For Chronic Migraine    Reviewed orally with patient, additionally signature is on file:  Botulism toxin has been approved by the Federal drug administration for treatment of chronic migraine. Botulism toxin does not cure chronic migraine and it may not be effective in some patients.  The administration of botulism toxin is accomplished by injecting a small amount of toxin into the muscles of the neck and head. Dosage must be titrated for each individual. Any benefits resulting from botulism toxin tend to wear off after 3 months with a repeat injection required if benefit is to be maintained. Injections are usually done every 3-4 months with maximum effect peak achieved by about 2 or 3 weeks. Botulism toxin is expensive and you should be sure of what costs you  will incur resulting from the injection.  The side effects of botulism toxin use for chronic migraine may include:   -Transient, and usually mild, facial weakness with facial injections  -Transient, and usually mild, head or neck weakness with head/neck injections  -Reduction or loss of forehead facial animation due to forehead muscle weakness  -Eyelid drooping  -Dry eye  -Pain at the site of injection or bruising at the site of injection  -Double vision  -Potential unknown long term risks   Contraindications: You should not have Botox if you are pregnant, nursing, allergic to albumin, have an infection, skin condition, or muscle weakness at the site of the injection, or have myasthenia gravis, Lambert-Eaton syndrome, or ALS.  It is also possible that as with any injection, there may be an allergic reaction or no effect from the medication. Reduced effectiveness after repeated injections is sometimes seen and rarely infection at the injection site may occur. All care will be taken to prevent these side effects. If therapy is given over a long time, atrophy and wasting in the muscle injected may occur. Occasionally the patient's become refractory to treatment because they develop antibodies to the toxin. In this event, therapy needs to be modified.  I have read the above information and consent to the administration of botulism toxin.    BOTOX PROCEDURE NOTE FOR MIGRAINE HEADACHE  Contraindications and precautions discussed with patient(above). Aseptic procedure was observed and patient tolerated procedure. Procedure performed by Ihor Austin, AGNP-BC.   The condition has existed for more than 6 months, and pt does not have a  diagnosis of ALS, Myasthenia Gravis or Lambert-Eaton Syndrome.  Risks and benefits of injections discussed and pt agrees to proceed with the procedure.  Written consent obtained  These injections are medically necessary. Pt  receives good benefits from these  injections. These injections do not cause sedations or hallucinations which the oral therapies may cause.   Description of procedure:  The patient was placed in a sitting position. The standard protocol was used for Botox as follows, with 5 units of Botox injected at each site:  -Procerus muscle, midline injection  -Corrugator muscle, bilateral injection  -Frontalis muscle, bilateral injection, with 2 sites each side, medial injection was performed in the upper one third of the frontalis muscle, in the region vertical from the medial inferior edge of the superior orbital rim. The lateral injection was again in the upper one third of the forehead vertically above the lateral limbus of the cornea, 1.5 cm lateral to the medial injection site.  -Temporalis muscle injection, 4 sites, bilaterally. The first injection was 3 cm above the tragus of the ear, second injection site was 1.5 cm to 3 cm up from the first injection site in line with the tragus of the ear. The third injection site was 1.5-3 cm forward between the first 2 injection sites. The fourth injection site was 1.5 cm posterior to the second injection site.   -Occipitalis muscle injection, 3 sites, bilaterally. The first injection was done one half way between the occipital protuberance and the tip of the mastoid process behind the ear. The second injection site was done lateral and superior to the first, 1 fingerbreadth from the first injection. The third injection site was 1 fingerbreadth superiorly and medially from the first injection site.  -Cervical paraspinal muscle injection, 2 sites, bilaterally. The first injection site was 1 cm from the midline of the cervical spine, 3 cm inferior to the lower border of the occipital protuberance. The second injection site was 1.5 cm superiorly and laterally to the first injection site.  -Trapezius muscle injection was performed at 3 sites, bilaterally. The first injection site was in the upper  trapezius muscle halfway between the inflection point of the neck, and the acromion. The second injection site was one half way between the acromion and the first injection site. The third injection was done between the first injection site and the inflection point of the neck.  -Masseter muscle injection, 1 site bilaterally     A total of 200 units of Botox was prepared, 165 units of Botox was injected as documented above, 35 u Botox not injected was wasted. The patient tolerated the procedure well, there were no complications of the above procedure.   Naomie Dean, MD  [facility] [address] [place], [place] [address]  Phone [phone] Fax [phone] Note: This document was prepared with digital dictation and possible smart phrase technology. Any transcriptional errors that result from this process are unintentional.

## 2023-10-26 ENCOUNTER — Other Ambulatory Visit: Payer: Self-pay | Admitting: Obstetrics and Gynecology

## 2023-10-26 DIAGNOSIS — B3731 Acute candidiasis of vulva and vagina: Secondary | ICD-10-CM

## 2023-10-30 ENCOUNTER — Other Ambulatory Visit: Payer: Self-pay | Admitting: Primary Care

## 2023-10-30 DIAGNOSIS — F902 Attention-deficit hyperactivity disorder, combined type: Secondary | ICD-10-CM

## 2023-10-30 MED ORDER — AMPHETAMINE-DEXTROAMPHET ER 30 MG PO CP24
30.0000 mg | ORAL_CAPSULE | ORAL | 0 refills | Status: DC
Start: 2023-10-30 — End: 2024-02-10

## 2023-10-30 NOTE — Telephone Encounter (Signed)
Last OV: 07/17/2023 Pending OV: Nothing scheduled  Medication: Adderall XR 30mg  Directions: Take one capsule daily Last Refill: 08/19/2023 Qty: #30 with 0 refills

## 2023-10-30 NOTE — Telephone Encounter (Signed)
Copied from CRM [id number]. Topic: Clinical - Medication Refill >> Oct 30, 2023  8:55 AM Larwance Sachs wrote: Most Recent Primary Care Visit:  Provider: Doreene Nest  Department: [facility]  Visit Type: OFFICE VISIT  Date: 07/17/2023  Medication: amphetamine-dextroamphetamine (ADDERALL XR) 30 MG 24 hr capsule  Has the patient contacted their pharmacy? Yes, needs new prescription  (Agent: If no, request that the patient contact the pharmacy for the refill. If patient does not wish to contact the pharmacy document the reason why and proceed with request.) (Agent: If yes, when and what did the pharmacy advise?)  Is this the correct pharmacy for this prescription? Yes If no, delete pharmacy and type the correct one.  This is the patient's preferred pharmacy:  [facility] [phone] [facility] [place] - [address] [place] [address] Phone: [phone] Fax: [phone]   Has the prescription been filled recently? No  Is the patient out of the medication? Yes  Has the patient been seen for an appointment in the last year OR does the patient have an upcoming appointment? Yes  Can we respond through MyChart? No  Agent: Please be advised that Rx refills may take up to 3 business days. We ask that you follow-up with your pharmacy.

## 2023-12-16 ENCOUNTER — Encounter: Payer: Self-pay | Admitting: Neurology

## 2023-12-17 ENCOUNTER — Telehealth: Payer: Self-pay | Admitting: *Deleted

## 2023-12-17 ENCOUNTER — Other Ambulatory Visit (HOSPITAL_COMMUNITY): Payer: Self-pay

## 2023-12-17 ENCOUNTER — Encounter: Payer: Self-pay | Admitting: Oncology

## 2023-12-17 ENCOUNTER — Telehealth: Payer: Self-pay

## 2023-12-17 NOTE — Telephone Encounter (Signed)
 Patient uploaded new insurance into chart. She needs PA for Botox before next visit in April.

## 2023-12-17 NOTE — Telephone Encounter (Addendum)
 Added new insurance into chart and submitted auth via [employer] portal. Botox isn't covered under [employer], so she'll need to be through medical benefit. Received approval, pt will continue to be buy/bill.  Auth#: [id number] (12/17/23-12/16/24)

## 2023-12-17 NOTE — Telephone Encounter (Signed)
 Nurtec PA needed due to new insurance. Patient uploaded card in chart. She is asking for urgent processing if possible.

## 2023-12-17 NOTE — Telephone Encounter (Signed)
 Pharmacy Patient Advocate Encounter   Received notification from Physician's Office that prior authorization for Nurtec 75MG  dispersible tablets is required/requested.   Insurance verification completed.   The patient is insured through [employer] .   Per test claim: PA required; PA submitted to above mentioned insurance via CoverMyMeds Key/confirmation #/EOC [id number] Status is pending

## 2023-12-18 ENCOUNTER — Other Ambulatory Visit (HOSPITAL_COMMUNITY): Payer: Self-pay

## 2023-12-18 NOTE — Telephone Encounter (Signed)
 Pharmacy Patient Advocate Encounter  Received notification from [employer] that Prior Authorization for Nurtec 75MG  dispersible tablets has been APPROVED from 12/17/2023 to 12/16/2024. Unable to obtain price due to refill too soon rejection, last fill date 12/17/2023 next available fill date04/08/2024   PA #/Case ID/Reference #: PA Case ID #: [id number]

## 2023-12-21 NOTE — Telephone Encounter (Signed)
 Thanks!  I notified the patient.

## 2024-01-10 ENCOUNTER — Other Ambulatory Visit: Payer: Self-pay | Admitting: Primary Care

## 2024-01-10 DIAGNOSIS — E559 Vitamin D deficiency, unspecified: Secondary | ICD-10-CM

## 2024-01-10 NOTE — Telephone Encounter (Signed)
 Patient is due for CPE/follow up in mid May, this will be required prior to any further refills.  Please schedule, thank you!

## 2024-01-11 NOTE — Telephone Encounter (Signed)
 LVM for patient to c/b and schedule.

## 2024-01-12 ENCOUNTER — Encounter: Payer: Self-pay | Admitting: Oncology

## 2024-01-12 NOTE — Telephone Encounter (Signed)
 Patient scheduled.

## 2024-01-19 ENCOUNTER — Encounter: Payer: Self-pay | Admitting: Oncology

## 2024-01-19 ENCOUNTER — Telehealth: Payer: Self-pay

## 2024-01-19 ENCOUNTER — Ambulatory Visit (INDEPENDENT_AMBULATORY_CARE_PROVIDER_SITE_OTHER): Payer: BLUE CROSS/BLUE SHIELD | Admitting: Adult Health

## 2024-01-19 VITALS — BP 141/83 | HR 100

## 2024-01-19 DIAGNOSIS — G43709 Chronic migraine without aura, not intractable, without status migrainosus: Secondary | ICD-10-CM

## 2024-01-19 DIAGNOSIS — G43009 Migraine without aura, not intractable, without status migrainosus: Secondary | ICD-10-CM

## 2024-01-19 MED ORDER — ONDANSETRON HCL 4 MG PO TABS: 4.0000 mg | ORAL_TABLET | Freq: Three times a day (TID) | ORAL | 11 refills | Status: AC | PRN

## 2024-01-19 MED ORDER — VYEPTI 100 MG/ML IV SOLN: 100.0000 mg | INTRAVENOUS | 4 refills | Status: AC

## 2024-01-19 MED ORDER — ONABOTULINUMTOXINA 200 UNITS IJ SOLR
155.0000 [IU] | Freq: Once | INTRAMUSCULAR | Status: AC
  Administered 2024-01-19: 165 [IU] via INTRAMUSCULAR

## 2024-01-19 MED ORDER — NURTEC 75 MG PO TBDP: 75.0000 mg | ORAL_TABLET | Freq: Every day | ORAL | 11 refills | Status: AC | PRN

## 2024-01-19 NOTE — Progress Notes (Signed)
 botox - 200 units x 1 vials Lot: [id number] Expiration: 12/2025 NDC: 1610-9604-54   Bacteriostatic 0.9% Sodium Chloride - 2 mL  Lot: [id number] Expiration: 07/30/2024 NDC: 1478-2956-21   Dx: 43.709 B/B Witnessed by Arville Laughter

## 2024-01-19 NOTE — Telephone Encounter (Signed)
 Vyepti  paperwork completed and given to intrafusion.

## 2024-01-19 NOTE — Progress Notes (Addendum)
 Update 01/19/2024 JM: Patient returns for repeat Botox .  Prior injection 10/22/2023 with Dr. Tresia Fruit.  Migraines typically well-controlled on Botox  and Qulipta  but can have worsening migraines 2 to 3 weeks prior to next injection.  She also continues to have frequent tension type headaches almost daily, usually wakes up with them. She did recently receive mouthguard by dentist for grinding, she does note some improvement of jaw pain with masseter injections which helps decrease headaches.  Use of Nurtec with benefit.  Also uses Zofran  with benefit.  She would be interested in pursuing Vyepti  and alternating with Botox . Will start process.  Advised to continue on Qulipta  until Vyepti  initiated.  Tolerated procedure well today.  Return in 3 months for repeat injection.  Previously tried/failed:  Sumatriptan  Rizatriptan  Nurtec Flexeril  Zofran  Topiramate  Amitriptyline Nortriptyline  Propranolol  Atenolol  Fluoxetine  Lexapro  Phentermine Magnesium  Ajovy  - improved but still frequent (was on for >9 months) Qulipta  - improved but still frequent (has been on for 10 months) Botox  - overall improved compared to baseline (~50% improvement)         Consent Form Botulism Toxin Injection For Chronic Migraine    Reviewed orally with patient, additionally signature is on file:  Botulism toxin has been approved by the Federal drug administration for treatment of chronic migraine. Botulism toxin does not cure chronic migraine and it may not be effective in some patients.  The administration of botulism toxin is accomplished by injecting a small amount of toxin into the muscles of the neck and head. Dosage must be titrated for each individual. Any benefits resulting from botulism toxin tend to wear off after 3 months with a repeat injection required if benefit is to be maintained. Injections are usually done every 3-4 months with maximum effect peak achieved by about 2 or 3 weeks. Botulism toxin is  expensive and you should be sure of what costs you will incur resulting from the injection.  The side effects of botulism toxin use for chronic migraine may include:   -Transient, and usually mild, facial weakness with facial injections  -Transient, and usually mild, head or neck weakness with head/neck injections  -Reduction or loss of forehead facial animation due to forehead muscle weakness  -Eyelid drooping  -Dry eye  -Pain at the site of injection or bruising at the site of injection  -Double vision  -Potential unknown long term risks   Contraindications: You should not have Botox  if you are pregnant, nursing, allergic to albumin, have an infection, skin condition, or muscle weakness at the site of the injection, or have myasthenia gravis, Lambert-Eaton syndrome, or ALS.  It is also possible that as with any injection, there may be an allergic reaction or no effect from the medication. Reduced effectiveness after repeated injections is sometimes seen and rarely infection at the injection site may occur. All care will be taken to prevent these side effects. If therapy is given over a long time, atrophy and wasting in the muscle injected may occur. Occasionally the patient's become refractory to treatment because they develop antibodies to the toxin. In this event, therapy needs to be modified.  I have read the above information and consent to the administration of botulism toxin.    BOTOX  PROCEDURE NOTE FOR MIGRAINE HEADACHE  Contraindications and precautions discussed with patient(above). Aseptic procedure was observed and patient tolerated procedure. Procedure performed by Johny Nap, AGNP-BC.   The condition has existed for more than 6 months, and pt does not have a diagnosis of ALS,  Myasthenia Gravis or Lambert-Eaton Syndrome.  Risks and benefits of injections discussed and pt agrees to proceed with the procedure.  Written consent obtained  These injections are medically  necessary. Pt  receives good benefits from these injections. These injections do not cause sedations or hallucinations which the oral therapies may cause.   Description of procedure:  The patient was placed in a sitting position. The standard protocol was used for Botox  as follows, with 5 units of Botox  injected at each site:  -Procerus muscle, midline injection  -Corrugator muscle, bilateral injection  -Frontalis muscle, bilateral injection, with 2 sites each side, medial injection was performed in the upper one third of the frontalis muscle, in the region vertical from the medial inferior edge of the superior orbital rim. The lateral injection was again in the upper one third of the forehead vertically above the lateral limbus of the cornea, 1.5 cm lateral to the medial injection site.  -Temporalis muscle injection, 4 sites, bilaterally. The first injection was 3 cm above the tragus of the ear, second injection site was 1.5 cm to 3 cm up from the first injection site in line with the tragus of the ear. The third injection site was 1.5-3 cm forward between the first 2 injection sites. The fourth injection site was 1.5 cm posterior to the second injection site. 5th site laterally in the temporalis  muscleat the level of the outer canthus.  -Occipitalis muscle injection, 3 sites, bilaterally. The first injection was done one half way between the occipital protuberance and the tip of the mastoid process behind the ear. The second injection site was done lateral and superior to the first, 1 fingerbreadth from the first injection. The third injection site was 1 fingerbreadth superiorly and medially from the first injection site.  -Cervical paraspinal muscle injection, 2 sites, bilaterally. The first injection site was 1 cm from the midline of the cervical spine, 3 cm inferior to the lower border of the occipital protuberance. The second injection site was 1.5 cm superiorly and laterally to the first  injection site.  -Trapezius muscle injection was performed at 3 sites, bilaterally. The first injection site was in the upper trapezius muscle halfway between the inflection point of the neck, and the acromion. The second injection site was one half way between the acromion and the first injection site. The third injection was done between the first injection site and the inflection point of the neck.  -Masseter muscle injection, 1 site bilaterally     A total of 200 units of Botox  was prepared, 155 units of Botox  was injected as documented above, any Botox  not injected was wasted. The patient tolerated the procedure well, there were no complications of the above procedure.   Johny Nap, AGNP-BC  [facility] [address] [place], [place] [address]  Phone [phone] Fax [phone] Note: This document was prepared with digital dictation and possible smart phrase technology. Any transcriptional errors that result from this process are unintentional.

## 2024-01-26 ENCOUNTER — Encounter: Payer: Self-pay | Admitting: Oncology

## 2024-02-01 ENCOUNTER — Telehealth: Payer: Self-pay

## 2024-02-01 ENCOUNTER — Other Ambulatory Visit (HOSPITAL_COMMUNITY): Payer: Self-pay

## 2024-02-01 NOTE — Telephone Encounter (Signed)
 Pharmacy Patient Advocate Encounter   Received notification from CoverMyMeds that prior authorization for Nurtec 75MG  dispersible tablets is required/requested.   Insurance verification completed.   The patient is insured through [employer] .   Per test claim: Refill too soon. PA is not needed at this time. Medication was filled 01/19/2024. Next eligible fill date is 02/11/2024.

## 2024-02-03 ENCOUNTER — Encounter (HOSPITAL_COMMUNITY): Payer: Self-pay

## 2024-02-10 ENCOUNTER — Encounter: Payer: Self-pay | Admitting: Oncology

## 2024-02-10 ENCOUNTER — Ambulatory Visit: Payer: Self-pay | Admitting: Primary Care

## 2024-02-10 ENCOUNTER — Encounter: Payer: Self-pay | Admitting: Primary Care

## 2024-02-10 ENCOUNTER — Ambulatory Visit (INDEPENDENT_AMBULATORY_CARE_PROVIDER_SITE_OTHER): Payer: Self-pay | Admitting: Primary Care

## 2024-02-10 VITALS — BP 116/84 | HR 60 | Temp 97.8°F | Ht 63.5 in | Wt 255.0 lb

## 2024-02-10 DIAGNOSIS — G43709 Chronic migraine without aura, not intractable, without status migrainosus: Secondary | ICD-10-CM

## 2024-02-10 DIAGNOSIS — F411 Generalized anxiety disorder: Secondary | ICD-10-CM | POA: Diagnosis not present

## 2024-02-10 DIAGNOSIS — F902 Attention-deficit hyperactivity disorder, combined type: Secondary | ICD-10-CM | POA: Diagnosis not present

## 2024-02-10 DIAGNOSIS — Z9884 Bariatric surgery status: Secondary | ICD-10-CM | POA: Diagnosis not present

## 2024-02-10 DIAGNOSIS — F331 Major depressive disorder, recurrent, moderate: Secondary | ICD-10-CM

## 2024-02-10 DIAGNOSIS — Z0000 Encounter for general adult medical examination without abnormal findings: Secondary | ICD-10-CM | POA: Diagnosis not present

## 2024-02-10 DIAGNOSIS — L719 Rosacea, unspecified: Secondary | ICD-10-CM

## 2024-02-10 DIAGNOSIS — E559 Vitamin D deficiency, unspecified: Secondary | ICD-10-CM

## 2024-02-10 DIAGNOSIS — E785 Hyperlipidemia, unspecified: Secondary | ICD-10-CM | POA: Diagnosis not present

## 2024-02-10 LAB — COMPREHENSIVE METABOLIC PANEL WITH GFR
ALT: 17 U/L (ref 0–35)
AST: 20 U/L (ref 0–37)
Albumin: 4 g/dL (ref 3.5–5.2)
Alkaline Phosphatase: 89 U/L (ref 39–117)
BUN: 9 mg/dL (ref 6–23)
CO2: 28 meq/L (ref 19–32)
Calcium: 8.9 mg/dL (ref 8.4–10.5)
Chloride: 105 meq/L (ref 96–112)
Creatinine, Ser: 0.72 mg/dL (ref 0.40–1.20)
GFR: 106.64 mL/min (ref >60.00)
Glucose, Bld: 93 mg/dL (ref 70–99)
Potassium: 4.1 meq/L (ref 3.5–5.1)
Sodium: 140 meq/L (ref 135–145)
Total Bilirubin: 0.4 mg/dL (ref 0.2–1.2)
Total Protein: 6.5 g/dL (ref 6.0–8.3)

## 2024-02-10 LAB — CBC
HCT: 35.8 % — ABNORMAL LOW (ref 36.0–46.0)
Hemoglobin: 12.3 g/dL (ref 12.0–15.0)
MCHC: 34.3 g/dL (ref 30.0–36.0)
MCV: 89 fl (ref 78.0–100.0)
Platelets: 313 10*3/uL (ref 150.0–400.0)
RBC: 4.03 Mil/uL (ref 3.87–5.11)
RDW: 13.9 % (ref 11.5–15.5)
WBC: 5 10*3/uL (ref 4.0–10.5)

## 2024-02-10 LAB — LIPID PANEL
Cholesterol: 213 mg/dL — ABNORMAL HIGH (ref 0–200)
HDL: 50.1 mg/dL (ref >39.00)
LDL Cholesterol: 139 mg/dL — ABNORMAL HIGH (ref 0–99)
NonHDL: 163.15
Total CHOL/HDL Ratio: 4
Triglycerides: 123 mg/dL (ref 0.0–149.0)
VLDL: 24.6 mg/dL (ref 0.0–40.0)

## 2024-02-10 LAB — VITAMIN D 25 HYDROXY (VIT D DEFICIENCY, FRACTURES): VITD: 16.78 ng/mL — ABNORMAL LOW (ref 30.00–100.00)

## 2024-02-10 LAB — VITAMIN B12: Vitamin B-12: 285 pg/mL (ref 211–911)

## 2024-02-10 LAB — HEMOGLOBIN A1C: Hgb A1c MFr Bld: 5.4 % (ref 4.6–6.5)

## 2024-02-10 MED ORDER — AMPHETAMINE-DEXTROAMPHET ER 30 MG PO CP24: 30.0000 mg | ORAL_CAPSULE | ORAL | 0 refills | Status: DC

## 2024-02-10 MED ORDER — FLUOXETINE HCL 20 MG PO TABS: ORAL_TABLET | ORAL | 0 refills | Status: DC

## 2024-02-10 NOTE — Assessment & Plan Note (Signed)
Labs pending today.  

## 2024-02-10 NOTE — Patient Instructions (Signed)
 Resume fluoxetine  for anxiety. Take 1 tablet daily x 1-2 weeks, then increase to 2 tablets daily thereafter. Please update me.  Resume Adderall as discussed.  Stop by the lab prior to leaving today. I will notify you of your results once received.   It was a pleasure to see you today!

## 2024-02-10 NOTE — Assessment & Plan Note (Signed)
 Repeat lipid panel pending.

## 2024-02-10 NOTE — Progress Notes (Signed)
 Subjective:    Patient ID: Jean Pitts, female    DOB: [birth year], 38 y.o.   MRN: [id number]  HPI  Jean Pitts is a very pleasant 38 y.o. female who presents today for complete physical and follow up of chronic conditions.  Immunizations: -Tetanus: Completed in 2024  Diet: Fair diet.  Exercise: No regular exercise.  Eye exam: Completes annually  Dental exam: Completes semi-annually    Pap Smear: Completed per GYN      Review of Systems  Constitutional:  Negative for unexpected weight change.  HENT:  Negative for rhinorrhea.   Respiratory:  Negative for cough and shortness of breath.   Cardiovascular:  Negative for chest pain.  Gastrointestinal:  Negative for constipation and diarrhea.  Genitourinary:  Negative for difficulty urinating.  Musculoskeletal:  Negative for arthralgias and myalgias.  Skin:  Negative for rash.  Allergic/Immunologic: Negative for environmental allergies.  Neurological:  Negative for dizziness and headaches.  Psychiatric/Behavioral:  The patient is nervous/anxious.          Past Medical History:  Diagnosis Date   Abnormal glucose tolerance test in pregnancy, antepartum 06/04/2012   Formatting of this note might be different from the original. Needs 3h GTT   Absolute anemia 10/22/2012   Anemia    B12 deficiency    Black tarry stools 12/20/2020   BRCA negative 10/2021   MyRisk neg   Family history of adverse reaction to anesthesia    MATERNAL AUNT-PT UNAWARE OF WHAT HAPPENED WITH ANESTHESIA BUT STATES HER AUNT HAS HAD PROBLEMS IN PAST    Family history of breast cancer 10/2021   IBIS=12.9%/riskscore=17.1%   Family history of ovarian cancer    Fecal occult blood test positive    Generalized abdominal pain 12/20/2020   Genital herpes    GERD (gastroesophageal reflux disease)    H/O cesarean section complicating pregnancy 04/19/2012   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/19/2021   Hair thinning 05/12/2018   Headache    MIGRAINE   Heartburn  10/26/2018   History of Roux-en-Y gastric bypass    Hyperlipidemia    Hypertension    Migraine    Obesity    PONV (postoperative nausea and vomiting)    Syncope 04/2015   Vitamin D  deficiency    Yeast vaginitis 02/23/2019   Candida albicans on culture    Social History   Socioeconomic History   Marital status: Married    Spouse name: Not on file   Number of children: 2   Years of education: Not on file   Highest education level: Associate degree: academic program  Occupational History   Occupation: Immunologist   Tobacco Use   Smoking status: Never   Smokeless tobacco: Never  Vaping Use   Vaping status: Never Used  Substance and Sexual Activity   Alcohol use: Not Currently    Comment: RARE   Drug use: No   Sexual activity: Yes    Partners: Male    Birth control/protection: Surgical  Other Topics Concern   Not on file  Social History Narrative      She also goes to school full time at [employer]   Social Drivers of Health   Financial Resource Strain: High Risk (06/24/2023)   Overall Financial Resource Strain (CARDIA)    Difficulty of Paying Living Expenses: Hard  Food Insecurity: Food Insecurity Present (06/24/2023)   Hunger Vital Sign    Worried About Running Out of Food in the Last Year: Sometimes true    Ran  Out of Food in the Last Year: Sometimes true  Transportation Needs: No Transportation Needs (07/13/2023)   PRAPARE - Administrator, Civil Service (Medical): No    Lack of Transportation (Non-Medical): No  Physical Activity: Insufficiently Active (07/13/2023)   Exercise Vital Sign    Days of Exercise per Week: 2 days    Minutes of Exercise per Session: 10 min  Stress: Stress Concern Present (07/13/2023)   [employer] of Occupational Health - Occupational Stress Questionnaire    Feeling of Stress : Rather much  Social Connections: Moderately Isolated (07/13/2023)   Social Connection and Isolation Panel [NHANES]    Frequency of  Communication with Friends and Family: Once a week    Frequency of Social Gatherings with Friends and Family: Once a week    Attends Religious Services: 1 to 4 times per year    Active Member of [employer] or Organizations: No    Attends Banker Meetings: Not on file    Marital Status: Married  Catering manager Violence: Not At Risk (05/12/2018)   Humiliation, Afraid, Rape, and Kick questionnaire    Fear of Current or Ex-Partner: No    Emotionally Abused: No    Physically Abused: No    Sexually Abused: No    Past Surgical History:  Procedure Laterality Date   APPENDECTOMY     BUNIONECTOMY     CESAREAN SECTION     X2   COLONOSCOPY WITH PROPOFOL  N/A 12/31/2020   Procedure: COLONOSCOPY WITH PROPOFOL . eleview and spot ink tattoo used.;  Surgeon: Irby Mannan, MD;  Location: [facility];  Service: Endoscopy;  Laterality: N/A;   COLONOSCOPY WITH PROPOFOL  N/A 09/18/2022   Procedure: COLONOSCOPY WITH PROPOFOL ;  Surgeon: Selena Daily, MD;  Location: [facility];  Service: Endoscopy;  Laterality: N/A;   DIAGNOSTIC LAPAROSCOPY     ENDOMETRIAL BIOPSY     ENTEROSCOPY N/A 01/23/2021   Procedure: ENTEROSCOPY;  Surgeon: Irby Mannan, MD;  Location: [facility];  Service: Endoscopy;  Laterality: N/A;   ENTEROSCOPY N/A 02/04/2021   Procedure: ENTEROSCOPY;  Surgeon: Asencion Blacksmith, MD;  Location: [facility];  Service: Gastroenterology;  Laterality: N/A;   ESOPHAGOGASTRODUODENOSCOPY (EGD) WITH PROPOFOL  N/A 12/31/2020   Procedure: ESOPHAGOGASTRODUODENOSCOPY (EGD) WITH PROPOFOL ;  Surgeon: Irby Mannan, MD;  Location: [facility];  Service: Endoscopy;  Laterality: N/A;   FOREIGN BODY REMOVAL  02/04/2021   Procedure: FOREIGN BODY REMOVAL;  Surgeon: Asencion Blacksmith, MD;  Location: [facility];  Service: Gastroenterology;;  staples from other gastric procedure   GASTRIC BYPASS     GIVENS CAPSULE STUDY N/A 02/19/2021   Procedure: GIVENS CAPSULE  STUDY;  Surgeon: Irby Mannan, MD;  Location: [facility];  Service: Endoscopy;  Laterality: N/A;   LASIK     MOUTH SURGERY     Tongue Cyst   NOVASURE ABLATION N/A 06/29/2015   Procedure: DILATION AND CURETTAGE, HYSTEROSCOPY, NOVASURE ABLATION;  Surgeon: Darl Edu, MD;  Location: [facility];  Service: Gynecology;  Laterality: N/A;   POLYPECTOMY  09/18/2022   Procedure: POLYPECTOMY;  Surgeon: Selena Daily, MD;  Location: [facility];  Service: Endoscopy;;   TUBAL LIGATION     WISDOM TOOTH EXTRACTION      Family History  Problem Relation Age of Onset   Hemangiomas Mother        benign osseous 2022 MRI   Migraines Mother    Obesity Mother    Hyperlipidemia Mother    Obesity  Sister    Obesity Sister    Obesity Brother    Ovarian cancer Maternal Grandmother 26       Cervical/Sqammacell   Breast cancer Maternal Aunt    Neuropathy Maternal Uncle    Breast cancer Maternal Great-grandmother    Skin cancer Maternal Great-grandmother     Allergies  Allergen Reactions   Augmentin  [Amoxicillin -Pot Clavulanate]     diarrhea   Codeine Other (See Comments)    Other reaction(s): Other (See Comments)   Hydrocodone -Acetaminophen  Nausea And Vomiting    Current Outpatient Medications on File Prior to Visit  Medication Sig Dispense Refill   Atogepant  (QULIPTA ) 60 MG TABS TAKE 1 TABLET BY MOUTH EVERY DAY 90 tablet 4   Botulinum Toxin Type A  (BOTOX ) 200 units SOLR Provider to inject 155 units into the muscles of the head and neck every 3 months. Discard remainder. 1 each 3   cyclobenzaprine  (FLEXERIL ) 5 MG tablet TAKE 1 TABLET BY MOUTH ONCE DAILY AS NEEDED FOR MIGRAINES. 30 tablet 0   Eptinezumab -jjmr (VYEPTI ) 100 MG/ML injection Inject 1 mL (100 mg total) into the vein every 3 (three) months. 1 mL 4   fluconazole  (DIFLUCAN ) 150 MG tablet Take 1 tab for 1 dose prn sx or weekly as preventive 10 tablet 1   metroNIDAZOLE  (METROGEL ) 0.75 % gel Apply 1 Application  topically 2 (two) times daily. 45 g 0   ondansetron  (ZOFRAN ) 4 MG tablet Take 1 tablet (4 mg total) by mouth every 8 (eight) hours as needed for nausea or vomiting. 20 tablet 11   Rimegepant Sulfate (NURTEC) 75 MG TBDP Take 1 tablet (75 mg total) by mouth daily as needed. For migraines. Take as close to onset of migraine as possible. One daily maximum. 16 tablet 11   SUMAtriptan  (IMITREX ) 6 MG/0.5ML SOLN injection INJECT 6 MG INTO THE SKIN AT MIGRAINE ONSET, MAY REPEAT WITH 6 MG 2 HOURS LATER IF MIGRAINE PERSISTS. MAX 2 DOSES PER 24 HOURS. 5 mL 5   Syringe/Needle, Disp, (BD SAFETYGLIDE SYRINGE/NEEDLE) 27G X 5/8" 1 ML MISC Attach needle to syringe and use to draw up and administer a single dose of Sumatriptan  into the skin. Do not reuse. 10 each 5   Vitamin D , Ergocalciferol , (DRISDOL ) 1.25 MG (50000 UNIT) CAPS capsule TAKE 1 CAPSULE (50,000 UNITS TOTAL) BY MOUTH EVERY 7 (SEVEN) DAYS. FOR VITAMIN D . 4 capsule 0   No current facility-administered medications on file prior to visit.    BP 116/84   Pulse 60   Temp 97.8 F (36.6 C) (Temporal)   Ht 5' 3.5" (1.613 m)   Wt 255 lb (115.7 kg)   SpO2 98%   BMI 44.46 kg/m  Objective:   Physical Exam HENT:     Right Ear: Tympanic membrane and ear canal normal.     Left Ear: Tympanic membrane and ear canal normal.  Eyes:     Pupils: Pupils are equal, round, and reactive to light.  Cardiovascular:     Rate and Rhythm: Normal rate and regular rhythm.  Pulmonary:     Effort: Pulmonary effort is normal.     Breath sounds: Normal breath sounds.  Abdominal:     General: Bowel sounds are normal.     Palpations: Abdomen is soft.     Tenderness: There is no abdominal tenderness.  Musculoskeletal:        General: Normal range of motion.     Cervical back: Neck supple.  Skin:    General: Skin is warm and  dry.  Neurological:     Mental Status: She is alert and oriented to person, place, and time.     Cranial Nerves: No cranial nerve deficit.      Deep Tendon Reflexes:     Reflex Scores:      Patellar reflexes are 2+ on the right side and 2+ on the left side. Psychiatric:        Mood and Affect: Mood normal.           Assessment & Plan:  Preventative health care Assessment & Plan: Immunizations UTD. Pap smear UTD. Follows with GYN  Discussed the importance of a healthy diet and regular exercise in order for weight loss, and to reduce the risk of further co-morbidity.  Exam stable. Labs pending.  Follow up in 1 year for repeat physical.    Chronic migraine without aura without status migrainosus, not intractable Assessment & Plan: Controlled and improved!  Start Vyepti  100 mg every 3 months. Continue Boxotx injections.  Continue Nurtec 75 mg every other day.    ADHD (attention deficit hyperactivity disorder), combined type Assessment & Plan: Controlled.   Resume Adderall XR 30 mg daily.  She had to use sparingly when she lost her insurance.   No suspicous activity noted on PDMP aware.  Orders: -     Amphetamine -Dextroamphet ER; Take 1 capsule (30 mg total) by mouth every morning.  Dispense: 30 capsule; Refill: 0  GAD (generalized anxiety disorder) Assessment & Plan: Off fluoxetine  since January 2025. Anxiety has deteriorated.  Resume fluoxetine  20 mg x 1-2 weeks, then increase 40 mg thereafter.    Vitamin D  deficiency Assessment & Plan: Not on supplementation for months. Repeat vitamin D  level pending.   Status post bariatric surgery Assessment & Plan: Labs pending today.  Orders: -     Vitamin B12 -     VITAMIN D  25 Hydroxy (Vit-D Deficiency, Fractures)  Hyperlipidemia, unspecified hyperlipidemia type Assessment & Plan: Repeat lipid panel pending  Orders: -     Comprehensive metabolic panel with GFR -     Lipid panel -     CBC -     Hemoglobin A1c  Moderate episode of recurrent major depressive disorder (HCC) -     FLUoxetine  HCl; Take 1 tablet by mouth once daily for 1-2 weeks,  then increase to 2 tablets daily for anxiety and depression.  Dispense: 180 tablet; Refill: 0  Rosacea Assessment & Plan: Stable.  Continue metronidazole  gel PRN         Elfa Wooton K Giovoni Bunch, NP

## 2024-02-10 NOTE — Assessment & Plan Note (Signed)
 Controlled.   Resume Adderall XR 30 mg daily.  She had to use sparingly when she lost her insurance.   No suspicous activity noted on PDMP aware.

## 2024-02-10 NOTE — Assessment & Plan Note (Signed)
 Not on supplementation for months. Repeat vitamin D  level pending.

## 2024-02-10 NOTE — Assessment & Plan Note (Signed)
 Immunizations UTD. Pap smear UTD.  Follows with GYN  Discussed the importance of a healthy diet and regular exercise in order for weight loss, and to reduce the risk of further co-morbidity.  Exam stable. Labs pending.  Follow up in 1 year for repeat physical.

## 2024-02-10 NOTE — Assessment & Plan Note (Signed)
 Stable.  Continue metronidazole  gel PRN

## 2024-02-10 NOTE — Assessment & Plan Note (Signed)
 Off fluoxetine  since January 2025. Anxiety has deteriorated.  Resume fluoxetine  20 mg x 1-2 weeks, then increase 40 mg thereafter.

## 2024-02-10 NOTE — Assessment & Plan Note (Signed)
 Controlled and improved!  Start Vyepti  100 mg every 3 months. Continue Boxotx injections.  Continue Nurtec 75 mg every other day.

## 2024-03-07 NOTE — Telephone Encounter (Signed)
 Noted I have made infusion suite aware

## 2024-03-07 NOTE — Telephone Encounter (Signed)
 [employer] Jean Pitts) Calling to confirm have received appeal for Vyepti .Will be completed as standard appeal by June 20

## 2024-04-13 NOTE — Telephone Encounter (Signed)
 Pt cancelled her Botox  appt for tomorrow stating that she just had Vyepti  infusion done. She said she thought she had to wait a certain amount of time before coming back for Botox . How far out do I need to reschedule her?

## 2024-04-13 NOTE — Telephone Encounter (Signed)
 I do not believe there is a certain timeframe they need to wait but preferably it would be good to alternate Botox  and Vyepti  every 6 weeks but I do not recommend waiting 6 weeks to receive botox . We can gradually space out botox  if patient wishes until we hit that 6 week mark as long as migraines stable such as scheduling out botox  to be completed in 3 weeks and then the next injection can be done at 15 weeks and then can start doing every 12 weeks again after that. If patient agrees to this but migraines start to become more frequent, she can call to have sooner botox  injection.

## 2024-04-13 NOTE — Telephone Encounter (Signed)
 Called and spoke with pt, she was agreeable to Jessica's plan and states the Vyepti  is going really well. Rescheduled for 8/13 @ 10:15 am (nothing available in exactly 3 weeks).

## 2024-04-14 ENCOUNTER — Ambulatory Visit: Admitting: Adult Health

## 2024-04-26 ENCOUNTER — Ambulatory Visit: Payer: Self-pay

## 2024-04-26 ENCOUNTER — Encounter: Payer: Self-pay | Admitting: Emergency Medicine

## 2024-04-26 ENCOUNTER — Ambulatory Visit
Admission: EM | Admit: 2024-04-26 | Discharge: 2024-04-26 | Disposition: A | Discharge status: Home / Self Care | Attending: Emergency Medicine | Admitting: Emergency Medicine

## 2024-04-26 DIAGNOSIS — U071 COVID-19: Secondary | ICD-10-CM

## 2024-04-26 DIAGNOSIS — B349 Viral infection, unspecified: Secondary | ICD-10-CM

## 2024-04-26 LAB — POC SOFIA SARS ANTIGEN FIA: SARS Coronavirus 2 Ag: POSITIVE — AB

## 2024-04-26 MED ORDER — PREDNISONE 10 MG (21) PO TBPK: ORAL_TABLET | Freq: Every day | ORAL | 0 refills | Status: DC

## 2024-04-26 MED ORDER — PAXLOVID (300/100) 20 X 150 MG & 10 X 100MG PO TBPK: 3.0000 | ORAL_TABLET | Freq: Two times a day (BID) | ORAL | 0 refills | Status: AC

## 2024-04-26 MED ORDER — PROMETHAZINE-DM 6.25-15 MG/5ML PO SYRP: 5.0000 mL | ORAL_SOLUTION | Freq: Every evening | ORAL | 0 refills | Status: DC | PRN

## 2024-04-26 MED ORDER — BENZONATATE 100 MG PO CAPS: 100.0000 mg | ORAL_CAPSULE | Freq: Three times a day (TID) | ORAL | 0 refills | Status: DC

## 2024-04-26 NOTE — Discharge Instructions (Addendum)
 Covid 19 is a virus and should steadily improve in time it can take up to 7 to 10 days before you truly start to see a turnaround however things will get better  Per the CDC you will need to quarantine until without fever for 24 hours.  Then you may return to activity wearing mask  Take paxlovid  twice daily for 5 days, this is the antiviral which reduces and suppresses the replication of the germ helping to reduce symptoms  You may use Tessalon  Perles every 8 hours for coughing, may use cough syrup at bedtime for comfort  Begin prednisone  every morning with food as directed to help reduce inflammation making it easier to breathe, avoid use of ibuprofen  during treatment     You can take Tylenol  and/or Ibuprofen  as needed for fever reduction and pain relief.   For cough: honey 1/2 to 1 teaspoon (you can dilute the honey in water  or another fluid).  You can also use guaifenesin and dextromethorphan for cough. You can use a humidifier for chest congestion and cough.  If you don't have a humidifier, you can sit in the bathroom with the hot shower running.      For sore throat: try warm salt water  gargles, cepacol lozenges, throat spray, warm tea or water  with lemon/honey, popsicles or ice, or OTC cold relief medicine for throat discomfort.   For congestion: take a daily anti-histamine like Zyrtec, Claritin, and a oral decongestant, such as pseudoephedrine.  You can also use Flonase 1-2 sprays in each nostril daily.   It is important to stay hydrated: drink plenty of fluids (water , gatorade/powerade/pedialyte, juices, or teas) to keep your throat moisturized and help further relieve irritation/discomfort.

## 2024-04-26 NOTE — Telephone Encounter (Signed)
 Noted

## 2024-04-26 NOTE — ED Provider Notes (Signed)
 CAY RALPH PELT    CSN: [id number] Arrival date & time: 04/26/24  1439      History   Chief Complaint Chief Complaint  Patient presents with   Cough   Generalized Body Aches   Sore Throat    HPI Jean Pitts is a 38 y.o. female.   Patient presents for evaluation of subjective fever, generalized bodyaches, intermittent headaches, severe sore throat, nonproductive cough, centralized chest denies known send shortness of breath with exertion present for 1 day.  Known exposure to COVID.  Has attempted Tylenol  and ibuprofen .  Decreased appetite but tolerable to some food and liquids.  Denies wheezing.  Past Medical History:  Diagnosis Date   Abnormal glucose tolerance test in pregnancy, antepartum 06/04/2012   Formatting of this note might be different from the original. Needs 3h GTT   Absolute anemia 10/22/2012   Anemia    B12 deficiency    Black tarry stools 12/20/2020   BRCA negative 10/2021   MyRisk neg   Family history of adverse reaction to anesthesia    MATERNAL AUNT-PT UNAWARE OF WHAT HAPPENED WITH ANESTHESIA BUT STATES HER AUNT HAS HAD PROBLEMS IN PAST    Family history of breast cancer 10/2021   IBIS=12.9%/riskscore=17.1%   Family history of ovarian cancer    Fecal occult blood test positive    Generalized abdominal pain 12/20/2020   Genital herpes    GERD (gastroesophageal reflux disease)    H/O cesarean section complicating pregnancy 04/19/2012   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/19/2021   Hair thinning 05/12/2018   Headache    MIGRAINE   Heartburn 10/26/2018   History of Roux-en-Y gastric bypass    Hyperlipidemia    Hypertension    Migraine    Obesity    PONV (postoperative nausea and vomiting)    Syncope 04/2015   Vitamin D  deficiency    Yeast vaginitis 02/23/2019   Candida albicans on culture    Patient Active Problem List   Diagnosis Date Noted   Rosacea 07/17/2023   Low libido 06/24/2023   Migraine without aura and without status migrainosus,  not intractable 04/22/2023   Excessive daytime sleepiness 04/05/2023   Recurrent vaginitis 02/04/2023   Vitamin D  deficiency 02/04/2023   History of colonic polyps 09/18/2022   Polyp of sigmoid colon 09/18/2022   Moderate episode of recurrent major depressive disorder (HCC) 09/09/2022   ADHD (attention deficit hyperactivity disorder), combined type 01/29/2022   Chronic fatigue 11/12/2021   Family history of breast cancer 10/15/2021   GAD (generalized anxiety disorder) 05/28/2021   Status post bariatric surgery 02/19/2021   IDA (iron deficiency anemia) 02/19/2021   Foreign body in intestine    Symptomatic anemia 01/31/2021   Orthostatic intolerance 01/31/2021   Preventative health care 01/17/2021   Dizziness 01/17/2021   Marginal ulcer    Polyp of ascending colon    Bunion of right foot 11/02/2018   Hyperlipidemia 10/26/2018   Morbid obesity with BMI of 40.0-44.9, adult (HCC) 06/04/2016   PCOS (polycystic ovarian syndrome) 06/04/2016   Chronic migraine without aura without status migrainosus, not intractable 12/04/2011    Past Surgical History:  Procedure Laterality Date   APPENDECTOMY     BUNIONECTOMY     CESAREAN SECTION     X2   COLONOSCOPY WITH PROPOFOL  N/A 12/31/2020   Procedure: COLONOSCOPY WITH PROPOFOL . eleview and spot ink tattoo used.;  Surgeon: Janalyn Keene NOVAK, MD;  Location: [facility];  Service: Endoscopy;  Laterality: N/A;   COLONOSCOPY WITH PROPOFOL   N/A 09/18/2022   Procedure: COLONOSCOPY WITH PROPOFOL ;  Surgeon: Unk Corinn Skiff, MD;  Location: [facility];  Service: Endoscopy;  Laterality: N/A;   DIAGNOSTIC LAPAROSCOPY     ENDOMETRIAL BIOPSY     ENTEROSCOPY N/A 01/23/2021   Procedure: ENTEROSCOPY;  Surgeon: Janalyn Keene NOVAK, MD;  Location: [facility];  Service: Endoscopy;  Laterality: N/A;   ENTEROSCOPY N/A 02/04/2021   Procedure: ENTEROSCOPY;  Surgeon: Aneita Gwendlyn DASEN, MD;  Location: [facility];  Service: Gastroenterology;   Laterality: N/A;   ESOPHAGOGASTRODUODENOSCOPY (EGD) WITH PROPOFOL  N/A 12/31/2020   Procedure: ESOPHAGOGASTRODUODENOSCOPY (EGD) WITH PROPOFOL ;  Surgeon: Janalyn Keene NOVAK, MD;  Location: [facility];  Service: Endoscopy;  Laterality: N/A;   FOREIGN BODY REMOVAL  02/04/2021   Procedure: FOREIGN BODY REMOVAL;  Surgeon: Aneita Gwendlyn DASEN, MD;  Location: [facility];  Service: Gastroenterology;;  staples from other gastric procedure   GASTRIC BYPASS     GIVENS CAPSULE STUDY N/A 02/19/2021   Procedure: GIVENS CAPSULE STUDY;  Surgeon: Janalyn Keene NOVAK, MD;  Location: [facility];  Service: Endoscopy;  Laterality: N/A;   LASIK     MOUTH SURGERY     Tongue Cyst   NOVASURE ABLATION N/A 06/29/2015   Procedure: DILATION AND CURETTAGE, HYSTEROSCOPY, NOVASURE ABLATION;  Surgeon: Glory High, MD;  Location: [facility];  Service: Gynecology;  Laterality: N/A;   POLYPECTOMY  09/18/2022   Procedure: POLYPECTOMY;  Surgeon: Unk Corinn Skiff, MD;  Location: [facility];  Service: Endoscopy;;   TUBAL LIGATION     WISDOM TOOTH EXTRACTION      OB History     Gravida  2   Para  2   Term  2   Preterm  0   AB  0   Living  2      SAB      IAB      Ectopic      Multiple      Live Births           Obstetric Comments  Both cesarean for failure to progress First child pre-eclampsia          Home Medications    Prior to Admission medications   Medication Sig Start Date End Date Taking? Authorizing Provider  benzonatate  (TESSALON ) 100 MG capsule Take 1 capsule (100 mg total) by mouth every 8 (eight) hours. 04/26/24  Yes Aidan Caloca R, NP  nirmatrelvir/ritonavir (PAXLOVID , 300/100,) 20 x 150 MG & 10 x 100MG  TBPK Take 3 tablets by mouth 2 (two) times daily for 5 days. Patient GFR is 106. Take nirmatrelvir (150 mg) two tablets twice daily for 5 days and ritonavir (100 mg) one tablet twice daily for 5 days. 04/26/24 05/01/24 Yes Shelita Steptoe R, NP  predniSONE   (STERAPRED UNI-PAK 21 TAB) 10 MG (21) TBPK tablet Take by mouth daily. Take 6 tabs by mouth daily  for 1 days, then 5 tabs for 1 days, then 4 tabs for 1 days, then 3 tabs for 1 days, 2 tabs for 1 days, then 1 tab by mouth daily for 1 days 04/26/24  Yes Chelsae Zanella R, NP  promethazine -dextromethorphan (PROMETHAZINE -DM) 6.25-15 MG/5ML syrup Take 5 mLs by mouth at bedtime as needed. 04/26/24  Yes Joanny Dupree, Shelba SAUNDERS, NP  amphetamine -dextroamphetamine (ADDERALL XR) 30 MG 24 hr capsule Take 1 capsule (30 mg total) by mouth every morning. 02/10/24   Clark, Katherine K, NP  Atogepant  (QULIPTA ) 60 MG TABS TAKE 1 TABLET BY MOUTH EVERY DAY 08/26/23   Ines,  Onetha NOVAK, MD  Botulinum Toxin Type A  (BOTOX ) 200 units SOLR Provider to inject 155 units into the muscles of the head and neck every 3 months. Discard remainder. 12/09/21   Millikan, Megan, NP  cyclobenzaprine  (FLEXERIL ) 5 MG tablet TAKE 1 TABLET BY MOUTH ONCE DAILY AS NEEDED FOR MIGRAINES. 11/27/22   Clark, Katherine K, NP  Eptinezumab -jjmr (VYEPTI ) 100 MG/ML injection Inject 1 mL (100 mg total) into the vein every 3 (three) months. 01/19/24   Whitfield Raisin, NP  fluconazole  (DIFLUCAN ) 150 MG tablet Take 1 tab for 1 dose prn sx or weekly as preventive 05/29/23   Copland, Alicia B, PA-C  FLUoxetine  (PROZAC ) 20 MG tablet Take 1 tablet by mouth once daily for 1-2 weeks, then increase to 2 tablets daily for anxiety and depression. 02/10/24   Clark, Katherine K, NP  metroNIDAZOLE  (METROGEL ) 0.75 % gel Apply 1 Application topically 2 (two) times daily. 07/17/23   Clark, Katherine K, NP  ondansetron  (ZOFRAN ) 4 MG tablet Take 1 tablet (4 mg total) by mouth every 8 (eight) hours as needed for nausea or vomiting. 01/19/24   Whitfield Raisin, NP  Rimegepant Sulfate (NURTEC) 75 MG TBDP Take 1 tablet (75 mg total) by mouth daily as needed. For migraines. Take as close to onset of migraine as possible. One daily maximum. 01/19/24   Whitfield Raisin, NP  SUMAtriptan  (IMITREX ) 6  MG/0.5ML SOLN injection INJECT 6 MG INTO THE SKIN AT MIGRAINE ONSET, MAY REPEAT WITH 6 MG 2 HOURS LATER IF MIGRAINE PERSISTS. MAX 2 DOSES PER 24 HOURS. 01/29/23   Ines Onetha NOVAK, MD  Syringe/Needle, Disp, (BD SAFETYGLIDE SYRINGE/NEEDLE) 27G X 5/8 1 ML MISC Attach needle to syringe and use to draw up and administer a single dose of Sumatriptan  into the skin. Do not reuse. 12/19/21   Millikan, Megan, NP  Vitamin D , Ergocalciferol , (DRISDOL ) 1.25 MG (50000 UNIT) CAPS capsule TAKE 1 CAPSULE (50,000 UNITS TOTAL) BY MOUTH EVERY 7 (SEVEN) DAYS. FOR VITAMIN D . 01/10/24   Gretta Comer POUR, NP    Family History Family History  Problem Relation Age of Onset   Hemangiomas Mother        benign osseous 2022 MRI   Migraines Mother    Obesity Mother    Hyperlipidemia Mother    Obesity Sister    Obesity Sister    Obesity Brother    Ovarian cancer Maternal Grandmother 26       Cervical/Sqammacell   Breast cancer Maternal Aunt    Neuropathy Maternal Uncle    Breast cancer Maternal Great-grandmother    Skin cancer Maternal Great-grandmother     Social History Social History   Tobacco Use   Smoking status: Never   Smokeless tobacco: Never  Vaping Use   Vaping status: Never Used  Substance Use Topics   Alcohol use: Not Currently    Comment: RARE   Drug use: No     Allergies   Augmentin  [amoxicillin -pot clavulanate], Codeine, and Hydrocodone -acetaminophen    Review of Systems Review of Systems   Physical Exam Triage Vital Signs ED Triage Vitals  Encounter Vitals Group     BP 04/26/24 1452 116/82     Girls Systolic BP Percentile --      Girls Diastolic BP Percentile --      Boys Systolic BP Percentile --      Boys Diastolic BP Percentile --      Pulse Rate 04/26/24 1452 91     Resp 04/26/24 1452 20     Temp  04/26/24 1452 98.9 F (37.2 C)     Temp Source 04/26/24 1452 Oral     SpO2 04/26/24 1452 98 %     Weight --      Height --      Head Circumference --      Peak Flow --       Pain Score 04/26/24 1454 8     Pain Loc --      Pain Education --      Exclude from Growth Chart --    No data found.  Updated Vital Signs BP 116/82 (BP Location: Left Arm)   Pulse 91   Temp 98.9 F (37.2 C) (Oral)   Resp 20   SpO2 98%   Visual Acuity Right Eye Distance:   Left Eye Distance:   Bilateral Distance:    Right Eye Near:   Left Eye Near:    Bilateral Near:     Physical Exam Constitutional:      Appearance: She is ill-appearing.  HENT:     Right Ear: Tympanic membrane, ear canal and external ear normal.     Left Ear: Tympanic membrane, ear canal and external ear normal.     Nose: No congestion.     Mouth/Throat:     Pharynx: No oropharyngeal exudate or posterior oropharyngeal erythema.  Eyes:     Extraocular Movements: Extraocular movements intact.  Cardiovascular:     Rate and Rhythm: Normal rate and regular rhythm.     Pulses: Normal pulses.     Heart sounds: Normal heart sounds.  Pulmonary:     Effort: Pulmonary effort is normal.     Breath sounds: Normal breath sounds.  Musculoskeletal:     Cervical back: Normal range of motion.  Lymphadenopathy:     Cervical: Cervical adenopathy present.  Neurological:     Mental Status: She is alert and oriented to person, place, and time. Mental status is at baseline.      UC Treatments / Results  Labs (all labs ordered are listed, but only abnormal results are displayed) Labs Reviewed  POC SOFIA SARS ANTIGEN FIA - Abnormal; Notable for the following components:      Result Value   SARS Coronavirus 2 Ag Positive (*)    All other components within normal limits    EKG   Radiology No results found.  Procedures Procedures (including critical care time)  Medications Ordered in UC Medications - No data to display  Initial Impression / Assessment and Plan / UC Course  I have reviewed the triage vital signs and the nursing notes.  Pertinent labs & imaging results that were available during my  care of the patient were reviewed by me and considered in my medical decision making (see chart for details).  COVID-19, viral illness  Patient is in no signs of distress nor toxic appearing.  Vital signs are stable.  Low suspicion for pneumonia, pneumothorax or bronchitis and therefore will defer imaging.  Discussed quarantine per CDC.  Prescribed Paxlovid , Tessalon , Promethazine  DM and prednisone .May use additional over-the-counter medications as needed for supportive care.  May follow-up with urgent care as needed if symptoms persist or worsen.  Note given.   Final Clinical Impressions(s) / UC Diagnoses   Final diagnoses:  Viral illness  COVID-19     Discharge Instructions      Covid 19 is a virus and should steadily improve in time it can take up to 7 to 10 days before you truly start to see  a turnaround however things will get better  Per the CDC you will need to quarantine until without fever for 24 hours.  Then you may return to activity wearing mask  Take paxlovid  twice daily for 5 days, this is the antiviral which reduces and suppresses the replication of the germ helping to reduce symptoms  You may use Tessalon  Perles every 8 hours for coughing, may use cough syrup at bedtime for comfort  Begin prednisone  every morning with food as directed to help reduce inflammation making it easier to breathe, avoid use of ibuprofen  during treatment     You can take Tylenol  and/or Ibuprofen  as needed for fever reduction and pain relief.   For cough: honey 1/2 to 1 teaspoon (you can dilute the honey in water  or another fluid).  You can also use guaifenesin and dextromethorphan for cough. You can use a humidifier for chest congestion and cough.  If you don't have a humidifier, you can sit in the bathroom with the hot shower running.      For sore throat: try warm salt water  gargles, cepacol lozenges, throat spray, warm tea or water  with lemon/honey, popsicles or ice, or OTC cold relief  medicine for throat discomfort.   For congestion: take a daily anti-histamine like Zyrtec, Claritin, and a oral decongestant, such as pseudoephedrine.  You can also use Flonase 1-2 sprays in each nostril daily.   It is important to stay hydrated: drink plenty of fluids (water , gatorade/powerade/pedialyte, juices, or teas) to keep your throat moisturized and help further relieve irritation/discomfort.    ED Prescriptions     Medication Sig Dispense Auth. Provider   nirmatrelvir/ritonavir (PAXLOVID , 300/100,) 20 x 150 MG & 10 x 100MG  TBPK Take 3 tablets by mouth 2 (two) times daily for 5 days. Patient GFR is 106. Take nirmatrelvir (150 mg) two tablets twice daily for 5 days and ritonavir (100 mg) one tablet twice daily for 5 days. 30 tablet Louanne Calvillo R, NP   predniSONE  (STERAPRED UNI-PAK 21 TAB) 10 MG (21) TBPK tablet Take by mouth daily. Take 6 tabs by mouth daily  for 1 days, then 5 tabs for 1 days, then 4 tabs for 1 days, then 3 tabs for 1 days, 2 tabs for 1 days, then 1 tab by mouth daily for 1 days 21 tablet Venetia Prewitt R, NP   benzonatate  (TESSALON ) 100 MG capsule Take 1 capsule (100 mg total) by mouth every 8 (eight) hours. 21 capsule Jadelin Eng R, NP   promethazine -dextromethorphan (PROMETHAZINE -DM) 6.25-15 MG/5ML syrup Take 5 mLs by mouth at bedtime as needed. 118 mL Tirso Laws, Shelba SAUNDERS, NP      I have reviewed the PDMP during this encounter.   Teresa Shelba SAUNDERS, [place] 04/26/24 [phone]

## 2024-04-26 NOTE — Telephone Encounter (Signed)
 FYI Only or Action Required?: FYI only for provider.  Patient was last seen in primary care on 02/10/2024 by Gretta Comer POUR, NP.  Called Nurse Triage reporting Covid Positive.  Symptoms began yesterday.  Interventions attempted: Nothing.  Symptoms are: gradually worsening.  Triage Disposition: See Today in Office (overriding Call PCP When Office is Open)  Patient/caregiver understands and will follow disposition?:  Reason for Disposition  [1] COVID-19 infection suspected by caller or triager AND [2] mild symptoms (cough, fever, or others) AND [3] negative COVID-19 rapid test  Answer Assessment - Initial Assessment Questions Patient has mild symptoms, prefers to be tested in-office as states she has difficulty performing self testing. No in office appts available, pt was provided with the closest [facility] to her address. States she will go there today.  1. COVID-19 DIAGNOSIS: How do you know that you have COVID? (e.g., positive lab test or self-test, diagnosed by doctor or NP/PA, symptoms after exposure).     Not diagnosed, not tested. Boss tested positive.  2. COVID-19 EXPOSURE: Was there any known exposure to COVID before the symptoms began? CDC Definition of close contact: within 6 feet (2 meters) for a total of 15 minutes or more over a 24-hour period.      Exposed to boss yesterday, boss tested positive  3. ONSET: When did the COVID-19 symptoms start?      Last night  4. WORST SYMPTOM: What is your worst symptom? (e.g., cough, fever, shortness of breath, muscle aches)     Weak, headache, sore throat  6. FEVER: Do you have a fever? If Yes, ask: What is your temperature, how was it measured, and when did it start?     Did not check  12. Risk factors: Obesity  Protocols used: Coronavirus (COVID-19) Diagnosed or Suspected-A-AH Copied from CRM #[id number]. Topic: Clinical - Red Word Triage >> Apr 26, 2024  1:12 PM Franky GRADE wrote: Red Word that prompted transfer to  Nurse Triage: Patient is experiencing cough, body aches, low grade fever, sore throat, her boss tested positive for Covid. Symptoms have progressed since yesterday.

## 2024-04-26 NOTE — ED Triage Notes (Signed)
 Patient complains of sore throat, cough, body aches . Patient reports that she was exposed to Covid 1 day ago.

## 2024-04-30 DIAGNOSIS — U071 COVID-19: Secondary | ICD-10-CM

## 2024-05-01 MED ORDER — ALBUTEROL SULFATE HFA 108 (90 BASE) MCG/ACT IN AERS: 2.0000 | INHALATION_SPRAY | RESPIRATORY_TRACT | 0 refills | Status: DC | PRN

## 2024-05-03 ENCOUNTER — Ambulatory Visit (INDEPENDENT_AMBULATORY_CARE_PROVIDER_SITE_OTHER)
Admission: RE | Admit: 2024-05-03 | Discharge: 2024-05-03 | Disposition: A | Discharge status: Home / Self Care | Source: Ambulatory Visit | Attending: Primary Care | Admitting: Primary Care

## 2024-05-03 ENCOUNTER — Encounter: Payer: Self-pay | Admitting: Primary Care

## 2024-05-03 ENCOUNTER — Ambulatory Visit (INDEPENDENT_AMBULATORY_CARE_PROVIDER_SITE_OTHER): Admitting: Primary Care

## 2024-05-03 VITALS — BP 118/62 | HR 73 | Temp 97.2°F | Ht 63.5 in | Wt 262.0 lb

## 2024-05-03 DIAGNOSIS — U071 COVID-19: Secondary | ICD-10-CM | POA: Insufficient documentation

## 2024-05-03 MED ORDER — HYDROCOD POLI-CHLORPHE POLI ER 10-8 MG/5ML PO SUER
5.0000 mL | Freq: Two times a day (BID) | ORAL | 0 refills | Status: DC | PRN
Start: 2024-05-03 — End: 2024-05-25

## 2024-05-03 NOTE — Assessment & Plan Note (Signed)
 Persistent symptoms since last week. Exam today overall stable.  Will add chest x-ray today given duration of symptoms without improvement despite treatment.  Prescription for Tussionex cough syrup sent to pharmacy.  She denies a known allergy to codeine. She has taken codeine cough suppressant previously without problems.   Continue conservative treatment. Work note provided.

## 2024-05-03 NOTE — Patient Instructions (Signed)
 Complete xray(s) prior to leaving today. I will notify you of your results once received.  Start Tussionex cough suppressant. Take 5 ml every 12 hours as needed for cough and rest. Caution this medication contains codeine which may cause drowsiness.   Continue with Tylenol /Ibuprofen  as needed.  It was a pleasure to see you today!

## 2024-05-03 NOTE — Progress Notes (Signed)
 Subjective:    Patient ID: Jean Pitts, female    DOB: Apr 15, 1986, 38 y.o.   MRN: [id number]  HPI  Jean Pitts is a very pleasant 37 y.o. female with a history of chronic migraines, hyperlipidemia, chronic fatigue who presents today to discuss URI symptoms.  Evaluated for urgent care on 04/26/2024 for a 1 day history of fevers, body aches, headaches, sore throat, cough, chest congestion.  She tested positive for COVID-19 infection but was treated with Paxlovid  course, Promethazine  DM, Tessalon  Perles, prednisone  taper.  She reached out to our office via MyChart on 04/30/2024 with continued symptoms, particularly chest tightness and shortness of breath.  An albuterol  inhaler was sent to her pharmacy.  Today she continues to experience a persistent cough, chest tightness, fatigue with minimal activity, headaches. She only feels slightly better. She's been using the albuterol  inhaler without much improvement. She completed the Paxlovid , prednisone . She's been working from home since last Monday. The medications have made her drowsy.    Review of Systems  Constitutional:  Positive for fatigue.  HENT:  Positive for sore throat.   Respiratory:  Positive for cough, chest tightness and shortness of breath.   Neurological:  Positive for dizziness and headaches.         Past Medical History:  Diagnosis Date   Abnormal glucose tolerance test in pregnancy, antepartum 06/04/2012   Formatting of this note might be different from the original. Needs 3h GTT   Absolute anemia 10/22/2012   Anemia    B12 deficiency    Black tarry stools 12/20/2020   BRCA negative 10/2021   MyRisk neg   Family history of adverse reaction to anesthesia    MATERNAL AUNT-PT UNAWARE OF WHAT HAPPENED WITH ANESTHESIA BUT STATES HER AUNT HAS HAD PROBLEMS IN PAST    Family history of breast cancer 10/2021   IBIS=12.9%/riskscore=17.1%   Family history of ovarian cancer    Fecal occult blood test positive     Generalized abdominal pain 12/20/2020   Genital herpes    GERD (gastroesophageal reflux disease)    H/O cesarean section complicating pregnancy 04/19/2012   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/19/2021   Hair thinning 05/12/2018   Headache    MIGRAINE   Heartburn 10/26/2018   History of Roux-en-Y gastric bypass    Hyperlipidemia    Hypertension    Migraine    Obesity    PONV (postoperative nausea and vomiting)    Syncope 04/2015   Vitamin D  deficiency    Yeast vaginitis 02/23/2019   Candida albicans on culture    Social History   Socioeconomic History   Marital status: Married    Spouse name: Not on file   Number of children: 2   Years of education: Not on file   Highest education level: Associate degree: academic program  Occupational History   Occupation: Immunologist   Tobacco Use   Smoking status: Never   Smokeless tobacco: Never  Vaping Use   Vaping status: Never Used  Substance and Sexual Activity   Alcohol use: Not Currently    Comment: RARE   Drug use: No   Sexual activity: Yes    Partners: Male    Birth control/protection: Surgical  Other Topics Concern   Not on file  Social History Narrative      She also goes to school full time at [employer]   Social Drivers of Health   Financial Resource Strain: Medium Risk (05/03/2024)   Overall Financial Resource Strain (CARDIA)  Difficulty of Paying Living Expenses: Somewhat hard  Food Insecurity: Patient Declined (05/03/2024)   Hunger Vital Sign    Worried About Running Out of Food in the Last Year: Patient declined    Ran Out of Food in the Last Year: Patient declined  Transportation Needs: No Transportation Needs (05/03/2024)   PRAPARE - Administrator, Civil Service (Medical): No    Lack of Transportation (Non-Medical): No  Physical Activity: Insufficiently Active (05/03/2024)   Exercise Vital Sign    Days of Exercise per Week: 1 day    Minutes of Exercise per Session: 10 min  Stress: Stress Concern  Present (05/03/2024)   [employer] of Occupational Health - Occupational Stress Questionnaire    Feeling of Stress: To some extent  Social Connections: Moderately Integrated (05/03/2024)   Social Connection and Isolation Panel    Frequency of Communication with Friends and Family: More than three times a week    Frequency of Social Gatherings with Friends and Family: Once a week    Attends Religious Services: 1 to 4 times per year    Active Member of [employer] or Organizations: No    Attends Banker Meetings: Not on file    Marital Status: Married  Catering manager Violence: Not At Risk (05/12/2018)   Humiliation, Afraid, Rape, and Kick questionnaire    Fear of Current or Ex-Partner: No    Emotionally Abused: No    Physically Abused: No    Sexually Abused: No    Past Surgical History:  Procedure Laterality Date   APPENDECTOMY     BUNIONECTOMY     CESAREAN SECTION     X2   COLONOSCOPY WITH PROPOFOL  N/A 12/31/2020   Procedure: COLONOSCOPY WITH PROPOFOL . eleview and spot ink tattoo used.;  Surgeon: Janalyn Keene NOVAK, MD;  Location: [facility];  Service: Endoscopy;  Laterality: N/A;   COLONOSCOPY WITH PROPOFOL  N/A 09/18/2022   Procedure: COLONOSCOPY WITH PROPOFOL ;  Surgeon: Unk Corinn Skiff, MD;  Location: [facility];  Service: Endoscopy;  Laterality: N/A;   DIAGNOSTIC LAPAROSCOPY     ENDOMETRIAL BIOPSY     ENTEROSCOPY N/A 01/23/2021   Procedure: ENTEROSCOPY;  Surgeon: Janalyn Keene NOVAK, MD;  Location: [facility];  Service: Endoscopy;  Laterality: N/A;   ENTEROSCOPY N/A 02/04/2021   Procedure: ENTEROSCOPY;  Surgeon: Aneita Gwendlyn DASEN, MD;  Location: [facility];  Service: Gastroenterology;  Laterality: N/A;   ESOPHAGOGASTRODUODENOSCOPY (EGD) WITH PROPOFOL  N/A 12/31/2020   Procedure: ESOPHAGOGASTRODUODENOSCOPY (EGD) WITH PROPOFOL ;  Surgeon: Janalyn Keene NOVAK, MD;  Location: [facility];  Service: Endoscopy;  Laterality: N/A;   FOREIGN BODY  REMOVAL  02/04/2021   Procedure: FOREIGN BODY REMOVAL;  Surgeon: Aneita Gwendlyn DASEN, MD;  Location: [facility];  Service: Gastroenterology;;  staples from other gastric procedure   GASTRIC BYPASS     GIVENS CAPSULE STUDY N/A 02/19/2021   Procedure: GIVENS CAPSULE STUDY;  Surgeon: Janalyn Keene NOVAK, MD;  Location: [facility];  Service: Endoscopy;  Laterality: N/A;   LASIK     MOUTH SURGERY     Tongue Cyst   NOVASURE ABLATION N/A 06/29/2015   Procedure: DILATION AND CURETTAGE, HYSTEROSCOPY, NOVASURE ABLATION;  Surgeon: Glory High, MD;  Location: [facility];  Service: Gynecology;  Laterality: N/A;   POLYPECTOMY  09/18/2022   Procedure: POLYPECTOMY;  Surgeon: Unk Corinn Skiff, MD;  Location: [facility];  Service: Endoscopy;;   TUBAL LIGATION     WISDOM TOOTH EXTRACTION      Family History  Problem Relation Age of Onset   Hemangiomas Mother        benign osseous 2022 MRI   Migraines Mother    Obesity Mother    Hyperlipidemia Mother    Obesity Sister    Obesity Sister    Obesity Brother    Ovarian cancer Maternal Grandmother 26       Cervical/Sqammacell   Breast cancer Maternal Aunt    Neuropathy Maternal Uncle    Breast cancer Maternal Great-grandmother    Skin cancer Maternal Great-grandmother     Allergies  Allergen Reactions   Augmentin  [Amoxicillin -Pot Clavulanate]     diarrhea   Hydrocodone -Acetaminophen  Nausea And Vomiting    Current Outpatient Medications on File Prior to Visit  Medication Sig Dispense Refill   albuterol  (VENTOLIN  HFA) 108 (90 Base) MCG/ACT inhaler Inhale 2 puffs into the lungs every 4 (four) hours as needed for shortness of breath. 8 g 0   amphetamine -dextroamphetamine (ADDERALL XR) 30 MG 24 hr capsule Take 1 capsule (30 mg total) by mouth every morning. 30 capsule 0   Botulinum Toxin Type A  (BOTOX ) 200 units SOLR Provider to inject 155 units into the muscles of the head and neck every 3 months. Discard remainder. 1 each 3    cyclobenzaprine  (FLEXERIL ) 5 MG tablet TAKE 1 TABLET BY MOUTH ONCE DAILY AS NEEDED FOR MIGRAINES. 30 tablet 0   Eptinezumab -jjmr (VYEPTI ) 100 MG/ML injection Inject 1 mL (100 mg total) into the vein every 3 (three) months. 1 mL 4   FLUoxetine  (PROZAC ) 20 MG tablet Take 1 tablet by mouth once daily for 1-2 weeks, then increase to 2 tablets daily for anxiety and depression. 180 tablet 0   metroNIDAZOLE  (METROGEL ) 0.75 % gel Apply 1 Application topically 2 (two) times daily. 45 g 0   ondansetron  (ZOFRAN ) 4 MG tablet Take 1 tablet (4 mg total) by mouth every 8 (eight) hours as needed for nausea or vomiting. 20 tablet 11   Rimegepant Sulfate (NURTEC) 75 MG TBDP Take 1 tablet (75 mg total) by mouth daily as needed. For migraines. Take as close to onset of migraine as possible. One daily maximum. 16 tablet 11   SUMAtriptan  (IMITREX ) 6 MG/0.5ML SOLN injection INJECT 6 MG INTO THE SKIN AT MIGRAINE ONSET, MAY REPEAT WITH 6 MG 2 HOURS LATER IF MIGRAINE PERSISTS. MAX 2 DOSES PER 24 HOURS. 5 mL 5   Syringe/Needle, Disp, (BD SAFETYGLIDE SYRINGE/NEEDLE) 27G X 5/8 1 ML MISC Attach needle to syringe and use to draw up and administer a single dose of Sumatriptan  into the skin. Do not reuse. 10 each 5   Vitamin D , Ergocalciferol , (DRISDOL ) 1.25 MG (50000 UNIT) CAPS capsule TAKE 1 CAPSULE (50,000 UNITS TOTAL) BY MOUTH EVERY 7 (SEVEN) DAYS. FOR VITAMIN D . 4 capsule 0   Atogepant  (QULIPTA ) 60 MG TABS TAKE 1 TABLET BY MOUTH EVERY DAY (Patient not taking: Reported on 05/03/2024) 90 tablet 4   No current facility-administered medications on file prior to visit.    BP 118/62   Pulse 73   Temp (!) 97.2 F (36.2 C) (Temporal)   Ht 5' 3.5 (1.613 m)   Wt 262 lb (118.8 kg)   SpO2 95%   BMI 45.68 kg/m  Objective:   Physical Exam Constitutional:      Appearance: She is ill-appearing.  HENT:     Right Ear: Tympanic membrane and ear canal normal.     Left Ear: Tympanic membrane and ear canal normal.     Nose: No mucosal  edema.     Right Sinus: No maxillary sinus tenderness or frontal sinus tenderness.     Left Sinus: No maxillary sinus tenderness or frontal sinus tenderness.  Eyes:     Conjunctiva/sclera: Conjunctivae normal.  Cardiovascular:     Rate and Rhythm: Normal rate and regular rhythm.  Pulmonary:     Effort: Pulmonary effort is normal.     Breath sounds: Normal breath sounds. No wheezing or rhonchi.     Comments: Persistent dry cough noted during visit Musculoskeletal:     Cervical back: Neck supple.  Skin:    General: Skin is warm and dry.           Assessment & Plan:  COVID-19 virus infection Assessment & Plan: Persistent symptoms since last week. Exam today overall stable.  Will add chest x-ray today given duration of symptoms without improvement despite treatment.  Prescription for Tussionex cough syrup sent to pharmacy.  She denies a known allergy to codeine. She has taken codeine cough suppressant previously without problems.   Continue conservative treatment. Work note provided.   Orders: -     Hydrocod Poli-Chlorphe Poli ER; Take 5 mLs by mouth every 12 (twelve) hours as needed.  Dispense: 50 mL; Refill: 0 -     DG Chest 2 View        Jean MARLA Gaskins, NP

## 2024-05-08 ENCOUNTER — Other Ambulatory Visit: Payer: Self-pay | Admitting: Primary Care

## 2024-05-08 DIAGNOSIS — F331 Major depressive disorder, recurrent, moderate: Secondary | ICD-10-CM

## 2024-05-11 ENCOUNTER — Ambulatory Visit (INDEPENDENT_AMBULATORY_CARE_PROVIDER_SITE_OTHER): Admitting: Adult Health

## 2024-05-11 ENCOUNTER — Ambulatory Visit: Payer: Self-pay | Admitting: Primary Care

## 2024-05-11 VITALS — BP 111/73 | HR 76

## 2024-05-11 DIAGNOSIS — G43009 Migraine without aura, not intractable, without status migrainosus: Secondary | ICD-10-CM

## 2024-05-11 DIAGNOSIS — G43709 Chronic migraine without aura, not intractable, without status migrainosus: Secondary | ICD-10-CM | POA: Diagnosis not present

## 2024-05-11 MED ORDER — ONABOTULINUMTOXINA 200 UNITS IJ SOLR: 155.0000 [IU] | Freq: Once | INTRAMUSCULAR | Status: DC

## 2024-05-11 MED ORDER — ONABOTULINUMTOXINA 200 UNITS IJ SOLR
155.0000 [IU] | Freq: Once | INTRAMUSCULAR | Status: AC
  Administered 2024-05-11 (×2): 165 [IU] via INTRAMUSCULAR

## 2024-05-11 NOTE — Progress Notes (Signed)
 Update 01/19/2024 JM: Patient returns for repeat Botox .  Prior injection 01/19/2024.  She reports continued benefit with Botox  injections.  She started Vyepti  on 7/9 and noticed significant improvement of migraines and only experienced a couple tension type headaches. She started to have increased tension type headaches a couple weeks ago but was also found to have COVID. She is due to repeat Vyepti  on 10/9. Use of nurtec with benefit.  She has been noticing grinding and clenching her teeth which can trigger headaches, reports continued benefit with masseter injections. Tolerated procedure well today.  She does question going need of botox  as Vyepti  very successful. For now, will be scheduled in 3 months for repeat injection but if doing well at that time, can stretch out to 4 months. She will keep us  updated.   Previously tried/failed:  Sumatriptan  Rizatriptan  Nurtec Flexeril  Zofran  Topiramate  Amitriptyline Nortriptyline  Propranolol  Atenolol  Fluoxetine  Lexapro  Phentermine Magnesium  Ajovy  - improved but still frequent (was on for >9 months) Qulipta  - improved but still frequent (has been on for 10 months) Botox  - overall improved compared to baseline (~50% improvement)         Consent Form Botulism Toxin Injection For Chronic Migraine    Reviewed orally with patient, additionally signature is on file:  Botulism toxin has been approved by the Federal drug administration for treatment of chronic migraine. Botulism toxin does not cure chronic migraine and it may not be effective in some patients.  The administration of botulism toxin is accomplished by injecting a small amount of toxin into the muscles of the neck and head. Dosage must be titrated for each individual. Any benefits resulting from botulism toxin tend to wear off after 3 months with a repeat injection required if benefit is to be maintained. Injections are usually done every 3-4 months with maximum effect peak  achieved by about 2 or 3 weeks. Botulism toxin is expensive and you should be sure of what costs you will incur resulting from the injection.  The side effects of botulism toxin use for chronic migraine may include:   -Transient, and usually mild, facial weakness with facial injections  -Transient, and usually mild, head or neck weakness with head/neck injections  -Reduction or loss of forehead facial animation due to forehead muscle weakness  -Eyelid drooping  -Dry eye  -Pain at the site of injection or bruising at the site of injection  -Double vision  -Potential unknown long term risks   Contraindications: You should not have Botox  if you are pregnant, nursing, allergic to albumin, have an infection, skin condition, or muscle weakness at the site of the injection, or have myasthenia gravis, Lambert-Eaton syndrome, or ALS.  It is also possible that as with any injection, there may be an allergic reaction or no effect from the medication. Reduced effectiveness after repeated injections is sometimes seen and rarely infection at the injection site may occur. All care will be taken to prevent these side effects. If therapy is given over a long time, atrophy and wasting in the muscle injected may occur. Occasionally the patient's become refractory to treatment because they develop antibodies to the toxin. In this event, therapy needs to be modified.  I have read the above information and consent to the administration of botulism toxin.    BOTOX  PROCEDURE NOTE FOR MIGRAINE HEADACHE  Contraindications and precautions discussed with patient(above). Aseptic procedure was observed and patient tolerated procedure. Procedure performed by Harlene Bogaert, AGNP-BC.   The condition has existed for more  than 6 months, and pt does not have a diagnosis of ALS, Myasthenia Gravis or Lambert-Eaton Syndrome.  Risks and benefits of injections discussed and pt agrees to proceed with the procedure.  Written consent  obtained  These injections are medically necessary. Pt  receives good benefits from these injections. These injections do not cause sedations or hallucinations which the oral therapies may cause.   Description of procedure:  The patient was placed in a sitting position. The standard protocol was used for Botox  as follows, with 5 units of Botox  injected at each site:  -Procerus muscle, midline injection  -Corrugator muscle, bilateral injection  -Frontalis muscle, bilateral injection, with 2 sites each side, medial injection was performed in the upper one third of the frontalis muscle, in the region vertical from the medial inferior edge of the superior orbital rim. The lateral injection was again in the upper one third of the forehead vertically above the lateral limbus of the cornea, 1.5 cm lateral to the medial injection site.  -Temporalis muscle injection, 4 sites, bilaterally. The first injection was 3 cm above the tragus of the ear, second injection site was 1.5 cm to 3 cm up from the first injection site in line with the tragus of the ear. The third injection site was 1.5-3 cm forward between the first 2 injection sites. The fourth injection site was 1.5 cm posterior to the second injection site. 5th site laterally in the temporalis  muscleat the level of the outer canthus.  -Occipitalis muscle injection, 3 sites, bilaterally. The first injection was done one half way between the occipital protuberance and the tip of the mastoid process behind the ear. The second injection site was done lateral and superior to the first, 1 fingerbreadth from the first injection. The third injection site was 1 fingerbreadth superiorly and medially from the first injection site.  -Cervical paraspinal muscle injection, 2 sites, bilaterally. The first injection site was 1 cm from the midline of the cervical spine, 3 cm inferior to the lower border of the occipital protuberance. The second injection site was 1.5  cm superiorly and laterally to the first injection site.  -Trapezius muscle injection was performed at 3 sites, bilaterally. The first injection site was in the upper trapezius muscle halfway between the inflection point of the neck, and the acromion. The second injection site was one half way between the acromion and the first injection site. The third injection was done between the first injection site and the inflection point of the neck.  -Masseter muscle injection, 1 site bilaterally     A total of 200 units of Botox  was prepared, 165 units of Botox  was injected as documented above, any Botox  not injected was wasted. The patient tolerated the procedure well, there were no complications of the above procedure.   Harlene Bogaert, AGNP-BC  [facility] [address] [place], [place] [address]  Phone [phone] Fax [phone] Note: This document was prepared with digital dictation and possible smart phrase technology. Any transcriptional errors that result from this process are unintentional.

## 2024-05-11 NOTE — Progress Notes (Signed)
 Botox - 200 units x 1 vial Lot: [id number] Expiration: 11/26/2026 NDC: 9976-6078-97  Bacteriostatic 0.9% Sodium Chloride - 4 mL  Lot: [id number] Expiration: 07/29/2025 NDC: 9590-8033-97  Dx: H56.290  B/B Witnessed by Stephania Samuel, CMA

## 2024-05-25 ENCOUNTER — Encounter: Payer: Self-pay | Admitting: Family

## 2024-05-25 ENCOUNTER — Encounter: Payer: Self-pay | Admitting: Oncology

## 2024-05-25 ENCOUNTER — Ambulatory Visit (INDEPENDENT_AMBULATORY_CARE_PROVIDER_SITE_OTHER): Admitting: Family

## 2024-05-25 VITALS — BP 122/80 | HR 107 | Temp 98.2°F | Ht 63.5 in | Wt 258.5 lb

## 2024-05-25 DIAGNOSIS — N3001 Acute cystitis with hematuria: Secondary | ICD-10-CM

## 2024-05-25 DIAGNOSIS — R350 Frequency of micturition: Secondary | ICD-10-CM

## 2024-05-25 DIAGNOSIS — R82998 Other abnormal findings in urine: Secondary | ICD-10-CM

## 2024-05-25 DIAGNOSIS — N898 Other specified noninflammatory disorders of vagina: Secondary | ICD-10-CM

## 2024-05-25 DIAGNOSIS — R051 Acute cough: Secondary | ICD-10-CM

## 2024-05-25 LAB — URINALYSIS, ROUTINE W REFLEX MICROSCOPIC
Ketones, ur: NEGATIVE
Nitrite: NEGATIVE
Specific Gravity, Urine: 1.02 (ref 1.000–1.030)
Total Protein, Urine: 30 — AB
Urine Glucose: NEGATIVE
Urobilinogen, UA: 4 — AB (ref 0.0–1.0)
pH: 7.5 (ref 5.0–8.0)

## 2024-05-25 LAB — POC URINALSYSI DIPSTICK (AUTOMATED)
Bilirubin, UA: NEGATIVE
Glucose, UA: NEGATIVE
Ketones, UA: NEGATIVE
Nitrite, UA: NEGATIVE
Protein, UA: POSITIVE — AB
Spec Grav, UA: 1.01 (ref 1.010–1.025)
Urobilinogen, UA: 2 U/dL — AB
pH, UA: 7.5 (ref 5.0–8.0)

## 2024-05-25 MED ORDER — NITROFURANTOIN MONOHYD MACRO 100 MG PO CAPS: 100.0000 mg | ORAL_CAPSULE | Freq: Two times a day (BID) | ORAL | 0 refills | Status: AC

## 2024-05-25 MED ORDER — BENZONATATE 200 MG PO CAPS: 200.0000 mg | ORAL_CAPSULE | Freq: Two times a day (BID) | ORAL | 0 refills | Status: DC | PRN

## 2024-05-25 NOTE — Progress Notes (Signed)
 "  Established Patient Office Visit  Subjective:      CC:  Chief Complaint  Patient presents with   Urinary Frequency    C/o urinary frequency and low abd pressure. Sxs started 05/24/24.     HPI: Jean Pitts is a 38 y.o. female presenting on 05/25/2024 for Urinary Frequency (C/o urinary frequency and low abd pressure. Sxs started 05/24/24. ) .  Discussed the use of AI scribe software for clinical note transcription with the patient, who gave verbal consent to proceed.  History of Present Illness Jean Pitts is a 38 year old female who presents with urinary urgency and pelvic pressure.  She has been experiencing discomfort and a sensation of pressure in the pelvic area, particularly when attempting to urinate. There is a feeling of urgency to urinate but she is unable to do so effectively. These symptoms began yesterday. She has been trying to increase her fluid intake, which makes it difficult to determine if she is urinating more frequently. No burning sensation during urination, unusual urinary odor, or mid-back pain. She reports chills, feeling warm, and a slight headache, but is unsure if she had a fever.  She notes a recent episode of vaginal discharge, described as thin and white, without any associated odor. She recently had a yeast infection last week, which was treated with Diflucan . She took one pill initially and repeated the dose three days later. The symptoms of redness and itching from the yeast infection have cleared up.  Her menstrual periods are described as random and sporadic, with more symptoms than actual bleeding. She experienced slightly more bleeding than usual in the past month, though not heavy, just noticeable beyond wiping. She has a history of PCOS and has undergone an ablation and tubal ligation.          Social history:  Relevant past medical, surgical, family and social history reviewed and updated as indicated. Interim medical history since  our last visit reviewed.  Allergies and medications reviewed and updated.  DATA REVIEWED: CHART IN EPIC     ROS: Negative unless specifically indicated above in HPI.    Current Outpatient Medications:    albuterol  (VENTOLIN  HFA) 108 (90 Base) MCG/ACT inhaler, Inhale 2 puffs into the lungs every 4 (four) hours as needed for shortness of breath., Disp: 8 g, Rfl: 0   amphetamine -dextroamphetamine (ADDERALL XR) 30 MG 24 hr capsule, Take 1 capsule (30 mg total) by mouth every morning., Disp: 30 capsule, Rfl: 0   benzonatate  (TESSALON ) 200 MG capsule, Take 1 capsule (200 mg total) by mouth 2 (two) times daily as needed for cough., Disp: 20 capsule, Rfl: 0   Botulinum Toxin Type A  (BOTOX ) 200 units SOLR, Provider to inject 155 units into the muscles of the head and neck every 3 months. Discard remainder., Disp: 1 each, Rfl: 3   cyclobenzaprine  (FLEXERIL ) 5 MG tablet, TAKE 1 TABLET BY MOUTH ONCE DAILY AS NEEDED FOR MIGRAINES., Disp: 30 tablet, Rfl: 0   Eptinezumab -jjmr (VYEPTI ) 100 MG/ML injection, Inject 1 mL (100 mg total) into the vein every 3 (three) months., Disp: 1 mL, Rfl: 4   FLUoxetine  (PROZAC ) 20 MG tablet, Take 2 tablets (40 mg total) by mouth daily. for anxiety and depression., Disp: 180 tablet, Rfl: 2   metroNIDAZOLE  (METROGEL ) 0.75 % gel, Apply 1 Application topically 2 (two) times daily., Disp: 45 g, Rfl: 0   nitrofurantoin , macrocrystal-monohydrate, (MACROBID ) 100 MG capsule, Take 1 capsule (100 mg total) by mouth 2 (two) times  daily for 5 days., Disp: 10 capsule, Rfl: 0   ondansetron  (ZOFRAN ) 4 MG tablet, Take 1 tablet (4 mg total) by mouth every 8 (eight) hours as needed for nausea or vomiting., Disp: 20 tablet, Rfl: 11   Rimegepant Sulfate (NURTEC) 75 MG TBDP, Take 1 tablet (75 mg total) by mouth daily as needed. For migraines. Take as close to onset of migraine as possible. One daily maximum., Disp: 16 tablet, Rfl: 11   SUMAtriptan  (IMITREX ) 6 MG/0.5ML SOLN injection, INJECT 6 MG  INTO THE SKIN AT MIGRAINE ONSET, MAY REPEAT WITH 6 MG 2 HOURS LATER IF MIGRAINE PERSISTS. MAX 2 DOSES PER 24 HOURS., Disp: 5 mL, Rfl: 5   Syringe/Needle, Disp, (BD SAFETYGLIDE SYRINGE/NEEDLE) 27G X 5/8 1 ML MISC, Attach needle to syringe and use to draw up and administer a single dose of Sumatriptan  into the skin. Do not reuse., Disp: 10 each, Rfl: 5   Vitamin D , Ergocalciferol , (DRISDOL ) 1.25 MG (50000 UNIT) CAPS capsule, TAKE 1 CAPSULE (50,000 UNITS TOTAL) BY MOUTH EVERY 7 (SEVEN) DAYS. FOR VITAMIN D ., Disp: 4 capsule, Rfl: 0        Objective:        BP 122/80   Pulse (!) 107   Temp 98.2 F (36.8 C) (Oral)   Ht 5' 3.5 (1.613 m)   Wt 258 lb 8 oz (117.3 kg)   SpO2 98%   BMI 45.07 kg/m   Physical Exam ABDOMEN: Mild tenderness on pelvic palpation.  Wt Readings from Last 3 Encounters:  05/25/24 258 lb 8 oz (117.3 kg)  05/03/24 262 lb (118.8 kg)  02/10/24 255 lb (115.7 kg)    Physical Exam Constitutional:      General: She is not in acute distress.    Appearance: Normal appearance. She is normal weight. She is not ill-appearing, toxic-appearing or diaphoretic.  Cardiovascular:     Rate and Rhythm: Normal rate.  Pulmonary:     Effort: Pulmonary effort is normal.  Abdominal:     General: Abdomen is flat.     Tenderness: There is abdominal tenderness. There is no right CVA tenderness or left CVA tenderness.  Neurological:     General: No focal deficit present.     Mental Status: She is alert and oriented to person, place, and time. Mental status is at baseline.     Motor: No weakness.  Psychiatric:        Mood and Affect: Mood normal.        Behavior: Behavior normal.        Thought Content: Thought content normal.        Judgment: Judgment normal.          Results LABS   Urinalysis: Hematuria, proteinuria, pyuria present (05/25/2024)  Assessment & Plan:   Assessment and Plan Assessment & Plan Suspected urinary tract infection Symptoms of urinary urgency  and pelvic pressure began yesterday. Urinalysis indicates blood, protein, and white blood cells, suggesting a urinary tract infection. Differential diagnosis includes bacterial vaginitis, pending wet prep results. No fever reported, but she experienced chills and body warmth last night. Antibiotic treatment is necessary to prevent progression to pyelonephritis, following CDC guidelines for symptomatic treatment. - Prescribe nitrofurantoin  (Macrobid ). - Send urine culture for confirmation. - Perform wet prep to rule out bacterial vaginitis.  Recent vulvovaginal candidiasis Recent episode treated with Diflucan , with resolution of redness and itching. Current symptoms differ from previous yeast infection. Awaiting wet prep results to confirm absence of yeast infection. - Await wet  prep results to confirm absence of yeast infection.  Polycystic ovary syndrome with irregular menstruation PCOS with irregular and sporadic menstruation. Recent increase in menstrual bleeding, but not heavy. History of endometrial ablation and tubal ligation.  Recording duration: 6 minutes      Return if symptoms worsen or fail to improve.     Ginger Patrick, MSN, APRN, FNP-C [place] [facility]     "

## 2024-05-26 ENCOUNTER — Ambulatory Visit: Payer: Self-pay | Admitting: Family

## 2024-05-29 LAB — URINE CULTURE
MICRO NUMBER:: 16896211
SPECIMEN QUALITY:: ADEQUATE

## 2024-06-07 ENCOUNTER — Other Ambulatory Visit: Payer: Self-pay | Admitting: Primary Care

## 2024-06-07 DIAGNOSIS — E559 Vitamin D deficiency, unspecified: Secondary | ICD-10-CM

## 2024-06-07 NOTE — Telephone Encounter (Signed)
 Patient needs lab only appointment to repeat vitamin D .  Nonfasting.  Orders placed.

## 2024-06-08 NOTE — Telephone Encounter (Signed)
 I called and left a voicemail for patient to be scheduled.

## 2024-06-13 ENCOUNTER — Other Ambulatory Visit: Payer: Self-pay | Admitting: Obstetrics and Gynecology

## 2024-06-13 ENCOUNTER — Encounter: Payer: Self-pay | Admitting: Obstetrics and Gynecology

## 2024-06-13 DIAGNOSIS — B3731 Acute candidiasis of vulva and vagina: Secondary | ICD-10-CM

## 2024-06-14 ENCOUNTER — Other Ambulatory Visit: Payer: Self-pay | Admitting: Obstetrics and Gynecology

## 2024-06-20 NOTE — Telephone Encounter (Signed)
 Lvm and sent mychart

## 2024-06-22 NOTE — Telephone Encounter (Signed)
 LVM to schedule lab only apt

## 2024-07-18 NOTE — Progress Notes (Unsigned)
 PCP:  Gretta Comer POUR, NP   No chief complaint on file.    HPI:      [name] is a 38 y.o. G2P2002 whose LMP was No LMP recorded. Patient has had an ablation., presents today for her annual examination.  Her menses are monthly, lasting 4 days with wiping only, occas BTB, mod dysmen, improved with tylenol /heating pad. S/p endometrial ablation. Hx of PCOS.   Sex activity: single partner, contraception - tubal ligation. Has vaginal dryness, using coconut oil as moisturizer, using lubricants. Was wearing thongs regularly and had hypertrophy of perineal skin but is doing better not wearing thongs.   Last Pap: 05/29/23 Results were: no abnormalities /neg HPV DNA Hx of STDs: HSV  Hx of recurrent yeast vag, needing wkly diflucan  in past as preventive. Pt taking probiotics, wearing thongs occas. Uses unscented soap, no dryer sheets.  Takes diflucan  prn now. Sx not as frequent. No recent DM screen.   There is a FH of breast cancer in her MGGM and mat aunt. Her cousin (aunt's daughter) had neg BRCA testing. There is a FH of ovarian cancer in her MGM in her 87s, then died with cancer mets in early 71s. Pt's mom almost certain it was ovarian and not cx.  The patient does not do self-breast exams. Pt is neg MyRisk 2/23, IBIS=12.9%/riskscore=17.1%.  Tobacco use: The patient denies current or previous tobacco use. Alcohol use: social drinker No drug use.  Exercise: min active  Had colonoscopy 2022 for internal bleeding. 12/23 with polyp at [facility]; repeat due after 5 yrs.   She does get adequate calcium and Vitamin D  in her diet. S/p bariatric surg; labs with PCP.  Past Medical History:  Diagnosis Date   Abnormal glucose tolerance test in pregnancy, antepartum 06/04/2012   Formatting of this note might be different from the original. Needs 3h GTT   Absolute anemia 10/22/2012   Anemia 11/2020   B12 deficiency    Black tarry stools 12/20/2020   Blood transfusion without  reported diagnosis 11/1986   BRCA negative 10/2021   MyRisk neg   Clotting disorder 11/2020   Internal Bleeding/Black Stools   Depression    Family history of adverse reaction to anesthesia    MATERNAL AUNT-PT UNAWARE OF WHAT HAPPENED WITH ANESTHESIA BUT STATES HER AUNT HAS HAD PROBLEMS IN PAST    Family history of breast cancer 10/2021   IBIS=12.9%/riskscore=17.1%   Family history of ovarian cancer    Fecal occult blood test positive    Generalized abdominal pain 12/20/2020   Genital herpes    GERD (gastroesophageal reflux disease)    H/O cesarean section complicating pregnancy 04/19/2012   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 eleview and spot ink tattoo used.;  Surgeon: Janalyn Keene NOVAK, MD;  Location: [facility];  Service: Endoscopy;  Laterality: N/A;   COLONOSCOPY WITH PROPOFOL  N/A 09/18/2022   Procedure: COLONOSCOPY WITH PROPOFOL ;  Surgeon: Unk Corinn Skiff, MD;  Location: [facility];  Service: Endoscopy;  Laterality: N/A;   DIAGNOSTIC LAPAROSCOPY     ENDOMETRIAL BIOPSY     ENTEROSCOPY N/A 01/23/2021   Procedure: ENTEROSCOPY;  Surgeon: Janalyn Keene NOVAK, MD;  Location: [facility];  Service: Endoscopy;  Laterality: N/A;   ENTEROSCOPY N/A 02/04/2021   Procedure: ENTEROSCOPY;  Surgeon: Aneita Gwendlyn DASEN, MD;  Location: [facility];  Service: Gastroenterology;  Laterality: N/A;   ESOPHAGOGASTRODUODENOSCOPY (EGD) WITH PROPOFOL   N/A 12/31/2020   Procedure: ESOPHAGOGASTRODUODENOSCOPY (EGD) WITH PROPOFOL ;  Surgeon: Janalyn Keene NOVAK, MD;  Location: [facility];  Service: Endoscopy;  Laterality: N/A;   EYE SURGERY     Laser Eye Surgery   FOREIGN BODY REMOVAL  02/04/2021   Procedure: FOREIGN BODY REMOVAL;  Surgeon: Aneita Gwendlyn DASEN, MD;  Location: [facility];  Service: Gastroenterology;;  staples from other gastric procedure   GASTRIC BYPASS     GIVENS CAPSULE STUDY N/A 02/19/2021   Procedure: GIVENS CAPSULE STUDY;  Surgeon: Janalyn Keene NOVAK, MD;  Location: [facility];  Service: Endoscopy;  Laterality: N/A;   LASIK     MOUTH SURGERY     Tongue Cyst   NOVASURE ABLATION N/A 06/29/2015   Procedure: DILATION AND CURETTAGE, HYSTEROSCOPY, NOVASURE ABLATION;  Surgeon: Glory High, MD;  Location: [facility];  Service: Gynecology;  Laterality: N/A;   POLYPECTOMY  09/18/2022   Procedure: POLYPECTOMY;  Surgeon: Unk Corinn Skiff, MD;  Location: [facility];  Service: Endoscopy;;   SMALL INTESTINE SURGERY     TUBAL LIGATION     WISDOM TOOTH EXTRACTION      Family History  Problem Relation Age of Onset   Hemangiomas Mother        benign osseous 2022 MRI   Migraines Mother    Obesity Mother    Hyperlipidemia Mother    Obesity Sister    Obesity Sister    Obesity Brother    Ovarian cancer Maternal Grandmother 26       Cervical/Sqammacell   Breast cancer Maternal Aunt    Neuropathy Maternal Uncle    Breast cancer Maternal Great-grandmother    Skin cancer Maternal Great-grandmother    ADD / ADHD Son    ADD / ADHD Son     Social History   Socioeconomic History   Marital status: Married    Spouse name: Not on file   Number of children: 2   Years of education: Not on file   Highest education level: Associate degree: academic program  Occupational History   Occupation: Immunologist   Tobacco Use   Smoking status: Never   Smokeless tobacco: Never  Vaping Use   Vaping  status: Never Used  Substance and Sexual Activity   Alcohol use: Not Currently    Comment: RARE   Drug use: No   Sexual activity: Yes    Partners: Male    Birth control/protection: Surgical  Other Topics Concern   Not on file  Social History Narrative      She also goes to school full time at [employer]   Social Drivers of Health   Financial Resource Strain: Medium Risk (05/03/2024)   Overall Financial Resource Strain (CARDIA)    Difficulty of Paying Living Expenses: Somewhat hard  Food Insecurity: Patient Declined (05/03/2024)   Hunger Vital Sign    Worried About Running Out of Food in the Last Year:  Patient declined    Barista in the Last Year: Patient declined  Transportation Needs: No Transportation Needs (05/03/2024)   PRAPARE - Administrator, Civil Service (Medical): No    Lack of Transportation (Non-Medical): No  Physical Activity: Insufficiently Active (05/03/2024)   Exercise Vital Sign    Days of Exercise per Week: 1 day    Minutes of Exercise per Session: 10 min  Stress: Stress Concern Present (05/03/2024)   [employer] of Occupational Health - Occupational Stress Questionnaire    Feeling of Stress: To some extent  Social Connections: Moderately Integrated (05/03/2024)   Social Connection and Isolation Panel    Frequency of Communication with Friends and Family: More than three times a week    Frequency of Social Gatherings with Friends and Family: Once a week    Attends Religious Services: 1 to 4 times per year    Active Member of [employer] or Organizations: No    Attends Engineer, structural: Not on file    Marital Status: Married  Catering manager Violence: Not At Risk (05/12/2018)   Humiliation, Afraid, Rape, and Kick questionnaire    Fear of Current or Ex-Partner: No    Emotionally Abused: No    Physically Abused: No    Sexually Abused: No     Current Outpatient Medications:    albuterol  (VENTOLIN  HFA) 108 (90 Base) MCG/ACT inhaler,  Inhale 2 puffs into the lungs every 4 (four) hours as needed for shortness of breath., Disp: 8 g, Rfl: 0   amphetamine -dextroamphetamine (ADDERALL XR) 30 MG 24 hr capsule, Take 1 capsule (30 mg total) by mouth every morning., Disp: 30 capsule, Rfl: 0   benzonatate  (TESSALON ) 200 MG capsule, Take 1 capsule (200 mg total) by mouth 2 (two) times daily as needed for cough., Disp: 20 capsule, Rfl: 0   Botulinum Toxin Type A  (BOTOX ) 200 units SOLR, Provider to inject 155 units into the muscles of the head and neck every 3 months. Discard remainder., Disp: 1 each, Rfl: 3   cyclobenzaprine  (FLEXERIL ) 5 MG tablet, TAKE 1 TABLET BY MOUTH ONCE DAILY AS NEEDED FOR MIGRAINES., Disp: 30 tablet, Rfl: 0   Eptinezumab -jjmr (VYEPTI ) 100 MG/ML injection, Inject 1 mL (100 mg total) into the vein every 3 (three) months., Disp: 1 mL, Rfl: 4   fluconazole  (DIFLUCAN ) 150 MG tablet, TAKE 1 TAB FOR 1 DOSE AS NEEDED FOR SYMPTOMS OR WEEKLY AS PREVENTIVE, Disp: 4 tablet, Rfl: 0   FLUoxetine  (PROZAC ) 20 MG tablet, Take 2 tablets (40 mg total) by mouth daily. for anxiety and depression., Disp: 180 tablet, Rfl: 2   metroNIDAZOLE  (METROGEL ) 0.75 % gel, Apply 1 Application topically 2 (two) times daily., Disp: 45 g, Rfl: 0   ondansetron  (ZOFRAN ) 4 MG tablet, Take 1 tablet (4 mg total) by mouth every 8 (eight) hours as needed for nausea or vomiting., Disp: 20 tablet, Rfl: 11   Rimegepant Sulfate (NURTEC) 75 MG TBDP, Take 1 tablet (75 mg total) by mouth daily as needed. For migraines. Take as close to onset of migraine as possible. One daily maximum., Disp: 16 tablet, Rfl: 11   SUMAtriptan  (IMITREX ) 6 MG/0.5ML SOLN injection, INJECT 6 MG INTO THE SKIN AT MIGRAINE ONSET, MAY REPEAT WITH 6 MG 2 HOURS LATER IF MIGRAINE PERSISTS. MAX 2 DOSES PER 24 HOURS., Disp: 5 mL, Rfl: 5   Syringe/Needle, Disp, (BD SAFETYGLIDE SYRINGE/NEEDLE) 27G X 5/8 1 ML MISC, Attach needle to syringe and use to draw up and  administer a single dose of Sumatriptan  into  the skin. Do not reuse., Disp: 10 each, Rfl: 5   Vitamin D , Ergocalciferol , (DRISDOL ) 1.25 MG (50000 UNIT) CAPS capsule, TAKE 1 CAPSULE (50,000 UNITS TOTAL) BY MOUTH EVERY 7 (SEVEN) DAYS. FOR VITAMIN D ., Disp: 4 capsule, Rfl: 0     ROS:  Review of Systems  Constitutional:  Negative for fatigue, fever and unexpected weight change.  Respiratory:  Negative for cough, shortness of breath and wheezing.   Cardiovascular:  Negative for chest pain, palpitations and leg swelling.  Gastrointestinal:  Negative for blood in stool, constipation, diarrhea, nausea and vomiting.  Endocrine: Negative for cold intolerance, heat intolerance and polyuria.  Genitourinary:  Negative for dyspareunia, dysuria, flank pain, frequency, genital sores, hematuria, menstrual problem, pelvic pain, urgency, vaginal bleeding, vaginal discharge and vaginal pain.  Musculoskeletal:  Negative for back pain, joint swelling and myalgias.  Skin:  Negative for rash.  Neurological:  Negative for dizziness, syncope, light-headedness, numbness and headaches.  Hematological:  Negative for adenopathy.  Psychiatric/Behavioral:  Negative for agitation, confusion, sleep disturbance and suicidal ideas. The patient is not nervous/anxious.   BREAST: No symptoms   Objective: There were no vitals taken for this visit.   Physical Exam Constitutional:      Appearance: She is well-developed.  Genitourinary:     Vulva normal.     Right Labia: No rash, tenderness or lesions.    Left Labia: No tenderness, lesions or rash.    No vaginal discharge, erythema or tenderness.      Right Adnexa: not tender and no mass present.    Left Adnexa: not tender and no mass present.    No cervical friability or polyp.     Uterus is not enlarged or tender.  Breasts:    Right: No mass, nipple discharge, skin change or tenderness.     Left: No mass, nipple discharge, skin change or tenderness.  Neck:     Thyroid : No thyromegaly.  Cardiovascular:      Rate and Rhythm: Normal rate and regular rhythm.     Heart sounds: Normal heart sounds. No murmur heard. Pulmonary:     Effort: Pulmonary effort is normal.     Breath sounds: Normal breath sounds.  Abdominal:     Palpations: Abdomen is soft.     Tenderness: There is no abdominal tenderness. There is no guarding or rebound.  Musculoskeletal:        General: Normal range of motion.     Cervical back: Normal range of motion.  Lymphadenopathy:     Cervical: No cervical adenopathy.  Neurological:     General: No focal deficit present.     Mental Status: She is alert and oriented to person, place, and time.     Cranial Nerves: No cranial nerve deficit.  Skin:    General: Skin is warm and dry.  Psychiatric:        Mood and Affect: Mood normal.        Behavior: Behavior normal.        Thought Content: Thought content normal.        Judgment: Judgment normal.  Vitals reviewed.     Assessment/Plan: Encounter for annual routine gynecological examination  Cervical cancer screening - Plan: Cytology - PAP  Screening for HPV (human papillomavirus) - Plan: Cytology - PAP  Family history of breast cancer--pt is MyRisk neg; no increased risk. Start mammos age 17  Yeast vaginitis - Plan: Hemoglobin A1c, fluconazole  (DIFLUCAN ) 150 MG tablet;  check for DM, Rx RF diflucan  prn sx. D/c thong use  PCOS (polycystic ovarian syndrome) - Plan: Hemoglobin A1c  Blood tests for routine general physical examination - Plan: Comprehensive metabolic panel    No orders of the defined types were placed in this encounter.            GYN counsel adequate intake of calcium and vitamin D , diet and exercise     F/U  No follow-ups on file.  Nicklous Aburto B. Harlow Carrizales, PA-C 07/18/2024 4:33 PM

## 2024-07-19 ENCOUNTER — Encounter: Payer: Self-pay | Admitting: Obstetrics and Gynecology

## 2024-07-19 ENCOUNTER — Ambulatory Visit (INDEPENDENT_AMBULATORY_CARE_PROVIDER_SITE_OTHER): Admitting: Obstetrics and Gynecology

## 2024-07-19 VITALS — BP 119/73 | HR 83 | Ht 63.5 in | Wt 260.0 lb

## 2024-07-19 DIAGNOSIS — Z01411 Encounter for gynecological examination (general) (routine) with abnormal findings: Secondary | ICD-10-CM | POA: Diagnosis not present

## 2024-07-19 DIAGNOSIS — E282 Polycystic ovarian syndrome: Secondary | ICD-10-CM | POA: Diagnosis not present

## 2024-07-19 DIAGNOSIS — Z803 Family history of malignant neoplasm of breast: Secondary | ICD-10-CM

## 2024-07-19 DIAGNOSIS — B3731 Acute candidiasis of vulva and vagina: Secondary | ICD-10-CM | POA: Diagnosis not present

## 2024-07-19 DIAGNOSIS — Z01419 Encounter for gynecological examination (general) (routine) without abnormal findings: Secondary | ICD-10-CM

## 2024-07-19 MED ORDER — FLUCONAZOLE 150 MG PO TABS: 150.0000 mg | ORAL_TABLET | Freq: Once | ORAL | 3 refills | Status: AC

## 2024-07-19 NOTE — Patient Instructions (Signed)
 I value your feedback and you entrusting [place] with your care. If you get a [place] patient survey, I would appreciate you taking the time to let us know about your experience today. Thank you! ? ? ?

## 2024-08-05 ENCOUNTER — Ambulatory Visit: Payer: Self-pay

## 2024-08-05 NOTE — Telephone Encounter (Signed)
 Noted, will evaluate.

## 2024-08-05 NOTE — Telephone Encounter (Signed)
 FYI Only or Action Required?: FYI only for provider: appointment scheduled on 08/09/2024.  Patient was last seen in primary care on 05/25/2024 by Corwin Antu, FNP.  Called Nurse Triage reporting Palpitations.  Symptoms began a week ago.  Interventions attempted: Rest, hydration, or home remedies.  Symptoms are: gradually worsening.  Triage Disposition: See PCP When Office is Open (Within 3 Days)  Patient/caregiver understands and will follow disposition?: Yes      Message from Mia F sent at 08/05/2024  8:27 AM EST  Reason for Triage: Last night she was dizzy and nauseous. Resting bp is 113 it has got up to 150 while resting on Wednesday. She says she recently started her ADHD medications and thats when she noticed it happened. Not having any symptoms now but it comes and goes  [phone] (M)   Reason for Disposition  [1] ADHD AND [2] taking stimulant medicine  Answer Assessment - Initial Assessment Questions Requesting an appointment to discuss symptoms with her PCP and a possible afternoon dosage of her ADHD medicaiton. This RN advised ED for any worsening symptoms and pt verbalized understanding. Symptoms currently resolved   1. DESCRIPTION: Please describe your heart rate or heartbeat that you are having (e.g., fast/slow, regular/irregular, skipped or extra beats, palpitations)     Fast heart rate  2. ONSET: When did it start? (e.g., minutes, hours, days)      Unsure but has been ongoing for awhile, several episode this week  3. DURATION: How long does it last (e.g., seconds, minutes, hours)     Minutes  4. PATTERN Does it come and go, or has it been constant since it started?  Does it get worse with exertion?   Are you feeling it now?     Comes and goes  5. HEART RATE: Can you tell me your heart rate? How many beats in 15 seconds?  Note: Not all patients can do this.       She states her HR was 113 resting and got up to 150 on Wednesday 6. RECURRENT  SYMPTOM: Have you ever had this before? If Yes, ask: When was the last time? and What happened that time?      Yes  7. CAUSE: What do you think is causing the palpitations?     Due to stress or starting her ADHD medications  8. OTHER SYMPTOMS: Do you have any other symptoms? (e.g., dizziness, chest pain, sweating, difficulty breathing)       Dizziness and nausea last night now resolved  Protocols used: Heart Rate and Heartbeat Questions-A-AH

## 2024-08-09 ENCOUNTER — Telehealth: Admitting: Primary Care

## 2024-08-09 ENCOUNTER — Encounter: Payer: Self-pay | Admitting: Primary Care

## 2024-08-09 ENCOUNTER — Ambulatory Visit: Attending: Primary Care

## 2024-08-09 VITALS — Ht 63.5 in | Wt 260.0 lb

## 2024-08-09 DIAGNOSIS — F411 Generalized anxiety disorder: Secondary | ICD-10-CM

## 2024-08-09 DIAGNOSIS — R000 Tachycardia, unspecified: Secondary | ICD-10-CM

## 2024-08-09 DIAGNOSIS — F902 Attention-deficit hyperactivity disorder, combined type: Secondary | ICD-10-CM

## 2024-08-09 DIAGNOSIS — F331 Major depressive disorder, recurrent, moderate: Secondary | ICD-10-CM | POA: Diagnosis not present

## 2024-08-09 MED ORDER — AMPHETAMINE-DEXTROAMPHET ER 30 MG PO CP24: 30.0000 mg | ORAL_CAPSULE | ORAL | 0 refills | Status: AC

## 2024-08-09 NOTE — Assessment & Plan Note (Signed)
 Uncontrolled.  Resume fluoxetine  20 mg daily x 1 to 2 weeks, then increase to 40 mg daily thereafter.

## 2024-08-09 NOTE — Patient Instructions (Signed)
 I recommend that you hold your Adderall stimulant medication for now given your symptoms.  Schedule a lab only appointment at your convenience.  Complete the Holter monitor once received.  Make sure to hold your Adderall while wearing the Holter monitor.  I will be in touch soon!

## 2024-08-09 NOTE — Progress Notes (Signed)
 Patient ID: Jean Pitts, female    DOB: June 23, 1986, 38 y.o.   MRN: [id number]  Virtual visit completed through [employer], a video enabled telemedicine application. Due to national recommendations of social distancing due to COVID-19, a virtual visit is felt to be most appropriate for this patient at this time. Reviewed limitations, risks, security and privacy concerns of performing a virtual visit and the availability of in person appointments. I also reviewed that there may be a patient responsible charge related to this service. The patient agreed to proceed.   Patient location: home Provider location: [place] at [facility], office Persons participating in this virtual visit: patient, provider   If any vitals were documented, they were collected by patient at home unless specified below.    Ht 5' 3.5 (1.613 m)   Wt 260 lb (117.9 kg)   LMP 06/29/2024 (Approximate)   BMI 45.33 kg/m    CC: Refill of Adderall, elevated HR Subjective:   HPI: Jean Pitts is a 38 y.o. female with a history of migraines, ADHD managed on stimulant treatment, dizziness, bariatric surgery, chronic fatigue presenting on 08/09/2024 for Medical Management of Chronic Issues (ADHD f/u. Needs refill on meds. Also, wants to discuss increased HR- possibly due to meds. )  Currently managed on Adderall XR 30 mg daily for ADHD.  Historically there has been no suspicious activity on PDMP aware.  She is needing a refill of her Adderall today.  Last refill was filled by patient on 02/10/2024.   She has noticed intermittent elevations in her heart rate and palpitations over the last few weeks with and without taking Adderall. She tracks her HR with her Apple Watch and has noticed readings up to 153 BPM. This occurs with mild exertion and rest, with and without stress. Also notices nausea and dizziness during episodes. She resumed her Adderall a few weeks ago and does very well while taking. She does notice the  medication wearing off around the afternoon and questions if she needs another dose in the afternoon.   She denies vaginal bleeding, rectal bleeding. She hardly drinks energy drinks now. She drinks 1-3 sodas daily, otherwise no other caffeine.   Her last dose of fluoxetine  was a few months ago. She's been very busy with work, increased stress. She plans on resuming today.       Relevant past medical, surgical, family and social history reviewed and updated as indicated. Interim medical history since our last visit reviewed. Allergies and medications reviewed and updated. Outpatient Medications Prior to Visit  Medication Sig Dispense Refill   Botulinum Toxin Type A  (BOTOX ) 200 units SOLR Provider to inject 155 units into the muscles of the head and neck every 3 months. Discard remainder. 1 each 3   cyclobenzaprine  (FLEXERIL ) 5 MG tablet TAKE 1 TABLET BY MOUTH ONCE DAILY AS NEEDED FOR MIGRAINES. 30 tablet 0   Eptinezumab -jjmr (VYEPTI ) 100 MG/ML injection Inject 1 mL (100 mg total) into the vein every 3 (three) months. 1 mL 4   FLUoxetine  (PROZAC ) 20 MG tablet Take 2 tablets (40 mg total) by mouth daily. for anxiety and depression. 180 tablet 2   metroNIDAZOLE  (METROGEL ) 0.75 % gel Apply 1 Application topically 2 (two) times daily. 45 g 0   ondansetron  (ZOFRAN ) 4 MG tablet Take 1 tablet (4 mg total) by mouth every 8 (eight) hours as needed for nausea or vomiting. 20 tablet 11   Rimegepant Sulfate (NURTEC) 75 MG TBDP Take 1 tablet (75 mg total)  by mouth daily as needed. For migraines. Take as close to onset of migraine as possible. One daily maximum. 16 tablet 11   SUMAtriptan  (IMITREX ) 6 MG/0.5ML SOLN injection INJECT 6 MG INTO THE SKIN AT MIGRAINE ONSET, MAY REPEAT WITH 6 MG 2 HOURS LATER IF MIGRAINE PERSISTS. MAX 2 DOSES PER 24 HOURS. 5 mL 5   Syringe/Needle, Disp, (BD SAFETYGLIDE SYRINGE/NEEDLE) 27G X 5/8 1 ML MISC Attach needle to syringe and use to draw up and administer a single dose of  Sumatriptan  into the skin. Do not reuse. 10 each 5   Vitamin D , Ergocalciferol , (DRISDOL ) 1.25 MG (50000 UNIT) CAPS capsule TAKE 1 CAPSULE (50,000 UNITS TOTAL) BY MOUTH EVERY 7 (SEVEN) DAYS. FOR VITAMIN D . 4 capsule 0   amphetamine -dextroamphetamine (ADDERALL XR) 30 MG 24 hr capsule Take 1 capsule (30 mg total) by mouth every morning. 30 capsule 0   No facility-administered medications prior to visit.     Per HPI unless specifically indicated in ROS section below Review of Systems  Respiratory:  Negative for shortness of breath.   Cardiovascular:  Positive for palpitations. Negative for chest pain.  Gastrointestinal:  Positive for nausea.  Neurological:  Positive for dizziness.  Psychiatric/Behavioral:  Negative for decreased concentration.    Objective:  Ht 5' 3.5 (1.613 m)   Wt 260 lb (117.9 kg)   LMP 06/29/2024 (Approximate)   BMI 45.33 kg/m   Wt Readings from Last 3 Encounters:  08/09/24 260 lb (117.9 kg)  07/19/24 260 lb (117.9 kg)  05/25/24 258 lb 8 oz (117.3 kg)       Physical exam: General: Alert and oriented x 3, no distress, does not appear sickly  Pulmonary: Speaks in complete sentences without increased work of breathing, no cough during visit.  Psychiatric: Normal mood, thought content, and behavior.     Results for orders placed or performed in visit on 05/25/24  POCT Urinalysis Dipstick (Automated)   Collection Time: 05/25/24 12:42 PM  Result Value Ref Range   Color, UA yellow    Clarity, UA clear    Glucose, UA Negative Negative   Bilirubin, UA negative    Ketones, UA negative    Spec Grav, UA 1.010 1.010 - 1.025   Blood, UA 1+    pH, UA 7.5 5.0 - 8.0   Protein, UA Positive (A) Negative   Urobilinogen, UA 2.0 (A) 0.2 or 1.0 E.U./dL   Nitrite, UA negative    Leukocytes, UA Small (1+) (A) Negative  Urine Culture   Collection Time: 05/25/24  1:33 PM   Specimen: Urine  Result Value Ref Range   MICRO NUMBER: [id number]    SPECIMEN QUALITY: Adequate     Sample Source NOT GIVEN    STATUS: FINAL    ISOLATE 1: Escherichia coli (A)    ISOLATE 2: Streptococcus agalactiae (A)       Susceptibility   Escherichia coli - URINE CULTURE, REFLEX    AMOX/CLAVULANIC 8 Sensitive     AMPICILLIN/SULBACTAM 8 Sensitive     CEFAZOLIN 4 Intermediate     CEFTAZIDIME <=0.5 Sensitive     CEFEPIME <=0.12 Sensitive     CEFTRIAXONE <=0.25 Sensitive     CIPROFLOXACIN  <=0.06 Sensitive     LEVOFLOXACIN <=0.12 Sensitive     GENTAMICIN <=1 Sensitive     IMIPENEM <=0.25 Sensitive     MEROPENEM <=0.25 Sensitive     NITROFURANTOIN  <=16 Sensitive     PIP/TAZO <=4 Sensitive     TRIMETH /SULFA * >=320 Resistant      *  Legend: S = Susceptible  I = Intermediate R = Resistant  NS = Not susceptible SDD = Susceptible Dose Dependent * = Not Tested  NR = Not Reported **NN = See Therapy Comments   Urinalysis, Routine w reflex microscopic   Collection Time: 05/25/24  1:33 PM  Result Value Ref Range   Color, Urine YELLOW Yellow;Lt. Yellow;Straw;Dark Yellow;Amber;Green;Red;Brown   APPearance Cloudy (A) Clear;Turbid;Slightly Cloudy;Cloudy   Specific Gravity, Urine 1.020 1.000 - 1.030   pH 7.5 5.0 - 8.0   Total Protein, Urine 30 (A) Negative   Urine Glucose NEGATIVE Negative   Ketones, ur NEGATIVE Negative   Bilirubin Urine SMALL (A) Negative   Hgb urine dipstick SMALL (A) Negative   Urobilinogen, UA 4.0 (A) 0.0 - 1.0   Leukocytes,Ua TRACE (A) Negative   Nitrite NEGATIVE Negative   WBC, UA 21-50/hpf (A) 0-2/hpf   RBC / HPF 7-10/hpf (A) 0-2/hpf   Mucus, UA Presence of (A) None   Squamous Epithelial / HPF Many(>10/hpf) (A) Rare(0-4/hpf)   Bacteria, UA Many(>50/hpf) (A) None   Assessment & Plan:   Problem List Items Addressed This Visit       Other   GAD (generalized anxiety disorder)   Uncontrolled.  Resume fluoxetine  20 mg daily x 1 to 2 weeks, then increase to 40 mg daily thereafter.      ADHD (attention deficit hyperactivity disorder), combined type    Well-controlled with stimulant treatment.  Given her current symptoms of tachycardia I recommended she discontinue for now until workup is complete. Agree that an afternoon IR dose is reasonable as long as she has no underlying cardiac concern.  Workup in process.  No historical suspicious activity on PDMP aware.  Reviewed today.      Relevant Medications   amphetamine -dextroamphetamine (ADDERALL XR) 30 MG 24 hr capsule   Moderate episode of recurrent major depressive disorder (HCC)   Tachycardia - Primary   Differentials include side effect from stimulant treatment for which we discussed in detail today, anemia, thyroid  disease, other cardiac abnormality.  Recommended to hold Adderall for now until workup has been complete. Labs pending including TSH, BMP, CBC. Holter monitor ordered and pending.  We discussed instructions for use.  Await results.      Relevant Orders   TSH   CBC   Basic metabolic panel with GFR   LONG TERM MONITOR (3-14 DAYS)     Meds ordered this encounter  Medications   amphetamine -dextroamphetamine (ADDERALL XR) 30 MG 24 hr capsule    Sig: Take 1 capsule (30 mg total) by mouth every morning.    Dispense:  30 capsule    Refill:  0   Orders Placed This Encounter  Procedures   TSH    Standing Status:   Future    Expiration Date:   08/09/2025   CBC    Standing Status:   Future    Expiration Date:   08/09/2025   Basic metabolic panel with GFR    Standing Status:   Future    Expiration Date:   08/09/2025   LONG TERM MONITOR (3-14 DAYS)    Standing Status:   Future    Number of Occurrences:   1    Expiration Date:   08/09/2025    Where should this test be performed?:   External    Does the patient have an implanted cardiac device?:   No    Prescribed days of wear:   14    Type of enrollment:  Home Enrollment    Vendor::   [employer]    Reason for Exam:   Tachycardia R00.0    I discussed the assessment and treatment plan with the patient. The  patient was provided an opportunity to ask questions and all were answered. The patient agreed with the plan and demonstrated an understanding of the instructions. The patient was advised to call back or seek an in-person evaluation if the symptoms worsen or if the condition fails to improve as anticipated.  Follow up plan:  I recommend that you hold your Adderall stimulant medication for now given your symptoms.  Schedule a lab only appointment at your convenience.  Complete the Holter monitor once received.  Make sure to hold your Adderall while wearing the Holter monitor.  I will be in touch soon!  Hamlet Lasecki K Tyarra Nolton, NP

## 2024-08-09 NOTE — Assessment & Plan Note (Signed)
 Differentials include side effect from stimulant treatment for which we discussed in detail today, anemia, thyroid  disease, other cardiac abnormality.  Recommended to hold Adderall for now until workup has been complete. Labs pending including TSH, BMP, CBC. Holter monitor ordered and pending.  We discussed instructions for use.  Await results.

## 2024-08-09 NOTE — Assessment & Plan Note (Signed)
 Well-controlled with stimulant treatment.  Given her current symptoms of tachycardia I recommended she discontinue for now until workup is complete. Agree that an afternoon IR dose is reasonable as long as she has no underlying cardiac concern.  Workup in process.  No historical suspicious activity on PDMP aware.  Reviewed today.

## 2024-08-22 ENCOUNTER — Other Ambulatory Visit (INDEPENDENT_AMBULATORY_CARE_PROVIDER_SITE_OTHER)

## 2024-08-22 DIAGNOSIS — R000 Tachycardia, unspecified: Secondary | ICD-10-CM

## 2024-08-22 DIAGNOSIS — E559 Vitamin D deficiency, unspecified: Secondary | ICD-10-CM | POA: Diagnosis not present

## 2024-08-22 LAB — BASIC METABOLIC PANEL WITH GFR
BUN: 11 mg/dL (ref 6–23)
CO2: 30 meq/L (ref 19–32)
Calcium: 9.2 mg/dL (ref 8.4–10.5)
Chloride: 106 meq/L (ref 96–112)
Creatinine, Ser: 0.7 mg/dL (ref 0.40–1.20)
GFR: 109.89 mL/min (ref >60.00)
Glucose, Bld: 73 mg/dL (ref 70–99)
Potassium: 4.8 meq/L (ref 3.5–5.1)
Sodium: 141 meq/L (ref 135–145)

## 2024-08-22 LAB — CBC
HCT: 37.6 % (ref 36.0–46.0)
Hemoglobin: 12.5 g/dL (ref 12.0–15.0)
MCHC: 33.2 g/dL (ref 30.0–36.0)
MCV: 88.5 fl (ref 78.0–100.0)
Platelets: 324 K/uL (ref 150.0–400.0)
RBC: 4.25 Mil/uL (ref 3.87–5.11)
RDW: 14.3 % (ref 11.5–15.5)
WBC: 7.1 K/uL (ref 4.0–10.5)

## 2024-08-22 LAB — VITAMIN D 25 HYDROXY (VIT D DEFICIENCY, FRACTURES): VITD: 14.74 ng/mL — ABNORMAL LOW (ref 30.00–100.00)

## 2024-08-22 LAB — TSH: TSH: 1.31 u[IU]/mL (ref 0.35–5.50)

## 2024-08-23 ENCOUNTER — Ambulatory Visit: Payer: Self-pay | Admitting: Primary Care

## 2024-08-27 ENCOUNTER — Other Ambulatory Visit: Payer: Self-pay | Admitting: Primary Care

## 2024-08-27 DIAGNOSIS — E559 Vitamin D deficiency, unspecified: Secondary | ICD-10-CM
